# Patient Record
Sex: Male | Born: 1960
Health system: Southern US, Community
[De-identification: ages and names within clinical notes are randomized; demographics above are authoritative.]

## PROBLEM LIST (undated history)

## (undated) DIAGNOSIS — K648 Other hemorrhoids: Secondary | ICD-10-CM

## (undated) DIAGNOSIS — D689 Coagulation defect, unspecified: Secondary | ICD-10-CM

## (undated) DIAGNOSIS — T7840XA Allergy, unspecified, initial encounter: Secondary | ICD-10-CM

## (undated) DIAGNOSIS — S7291XA Unspecified fracture of right femur, initial encounter for closed fracture: Secondary | ICD-10-CM

## (undated) DIAGNOSIS — I739 Peripheral vascular disease, unspecified: Secondary | ICD-10-CM

## (undated) DIAGNOSIS — E039 Hypothyroidism, unspecified: Secondary | ICD-10-CM

## (undated) DIAGNOSIS — IMO0002 Reserved for concepts with insufficient information to code with codable children: Secondary | ICD-10-CM

## (undated) DIAGNOSIS — G8929 Other chronic pain: Secondary | ICD-10-CM

## (undated) DIAGNOSIS — M1990 Unspecified osteoarthritis, unspecified site: Secondary | ICD-10-CM

## (undated) DIAGNOSIS — E785 Hyperlipidemia, unspecified: Secondary | ICD-10-CM

## (undated) DIAGNOSIS — K219 Gastro-esophageal reflux disease without esophagitis: Secondary | ICD-10-CM

## (undated) DIAGNOSIS — E291 Testicular hypofunction: Secondary | ICD-10-CM

## (undated) DIAGNOSIS — G4730 Sleep apnea, unspecified: Secondary | ICD-10-CM

## (undated) DIAGNOSIS — Z8719 Personal history of other diseases of the digestive system: Secondary | ICD-10-CM

## (undated) HISTORY — DX: Gastro-esophageal reflux disease without esophagitis: K21.9

## (undated) HISTORY — DX: Testicular hypofunction: E29.1

## (undated) HISTORY — DX: Other chronic pain: G89.29

## (undated) HISTORY — DX: Hypothyroidism, unspecified: E03.9

## (undated) HISTORY — DX: Hyperlipidemia, unspecified: E78.5

## (undated) HISTORY — DX: Allergy, unspecified, initial encounter: T78.40XA

## (undated) HISTORY — DX: Coagulation defect, unspecified: D68.9

## (undated) HISTORY — DX: Reserved for concepts with insufficient information to code with codable children: IMO0002

## (undated) HISTORY — DX: Peripheral vascular disease, unspecified: I73.9

## (undated) HISTORY — DX: Personal history of other diseases of the digestive system: Z87.19

## (undated) HISTORY — DX: Other hemorrhoids: K64.8

## (undated) HISTORY — DX: Unspecified fracture of right femur, initial encounter for closed fracture: S72.91XA

## (undated) HISTORY — DX: Sleep apnea, unspecified: G47.30

## (undated) HISTORY — DX: Unspecified osteoarthritis, unspecified site: M19.90

---
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
## 2003-03-17 ENCOUNTER — Ambulatory Visit (HOSPITAL_COMMUNITY): Admission: RE | Admit: 2003-03-17 | Discharge: 2003-03-17 | Payer: Self-pay | Admitting: Gastroenterology

## 2007-08-08 ENCOUNTER — Encounter: Admission: RE | Admit: 2007-08-08 | Discharge: 2007-08-08 | Payer: Self-pay | Admitting: Family Medicine

## 2008-12-28 DIAGNOSIS — IMO0002 Reserved for concepts with insufficient information to code with codable children: Secondary | ICD-10-CM

## 2008-12-28 HISTORY — DX: Reserved for concepts with insufficient information to code with codable children: IMO0002

## 2010-01-25 HISTORY — PX: CARPAL TUNNEL RELEASE: SHX101

## 2010-11-12 NOTE — Op Note (Signed)
   NAME:  Cesar Leon, Cesar Leon                       ACCOUNT NO.:  [id number]   MEDICAL RECORD NO.:  [id number]                   PATIENT TYPE:  AMB   LOCATION:  ENDO                                 FACILITY:  [facility]   PHYSICIAN:  James L. Malon Kindle., M.D.          DATE OF BIRTH:  [birth year]   DATE OF PROCEDURE:  03/17/2003  DATE OF DISCHARGE:                                 OPERATIVE REPORT   PROCEDURE:  Colonoscopy.   MEDICATIONS:  Fentanyl 87.5 mcg, Versed 7 mg IV.   INDICATIONS:  The patient had a physical exam with a possible rectal polyp  found.   DESCRIPTION OF PROCEDURE:  The procedure had been explained to the patient  and consent obtained.  The patient was placed in the left lateral decubitus  position.  Olympus scope was inserted and advanced.  We were able to advance  all the way to the cecum without difficulty.  The ileocecal valve and  appendiceal orifice were seen.  The scope was withdrawn and colon carefully  examined on withdrawal.  The cecum, ascending colon, transverse colon,  descending, and sigmoid colon were seen well.  No polyps.  No diverticular  disease.  The scope was withdrawn in the rectum and retroflexed.  There were  no polyps seen in the rectum.  There were rather large internal hemorrhoids.  The scope was withdrawn and the patient tolerated the procedure well.   ASSESSMENT:  1. Large internal hemorrhoids.  2. No polyps are suggested by rectal exam.   PLAN:  Will give him hemorrhoid instructions.  Keep fiber supplements.  See  back on an as-needed basis.                                                James L. Malon Kindle., M.D.    Waldron Session  D:  03/17/2003  T:  03/17/2003  Job:  [id number]   cc:   Ernestina Penna, M.D.  [address] [place]  [place] [address]  Fax: [phone]

## 2010-11-26 ENCOUNTER — Encounter: Payer: Self-pay | Admitting: Physician Assistant

## 2012-10-08 ENCOUNTER — Ambulatory Visit (INDEPENDENT_AMBULATORY_CARE_PROVIDER_SITE_OTHER): Payer: BC Managed Care – PPO | Admitting: *Deleted

## 2012-10-08 DIAGNOSIS — R7989 Other specified abnormal findings of blood chemistry: Secondary | ICD-10-CM

## 2012-10-08 DIAGNOSIS — E291 Testicular hypofunction: Secondary | ICD-10-CM

## 2012-10-08 MED ORDER — TESTOSTERONE CYPIONATE 200 MG/ML IM SOLN
400.0000 mg | INTRAMUSCULAR | Status: DC
  Administered 2012-10-08 – 2013-03-20 (×6): 400 mg via INTRAMUSCULAR

## 2012-10-08 NOTE — Progress Notes (Signed)
Tolerated injection well. 

## 2012-10-08 NOTE — Patient Instructions (Signed)
Testosterone injection What is this medicine? TESTOSTERONE (tes TOS ter one) is the main male hormone. It supports normal male development such as muscle growth, facial hair, and deep voice. It is used in males to treat low testosterone levels. This medicine may be used for other purposes; ask your health care provider or pharmacist if you have questions. What should I tell my health care provider before I take this medicine? They need to know if you have any of these conditions: -breast cancer -diabetes -heart disease -kidney disease -liver disease -lung disease -prostate cancer, enlargement -an unusual or allergic reaction to testosterone, other medicines, foods, dyes, or preservatives -pregnant or trying to get pregnant -breast-feeding How should I use this medicine? This medicine is for injection into a muscle. It is usually given by a health care professional in a hospital or clinic setting. Contact your pediatrician regarding the use of this medicine in children. While this medicine may be prescribed for children as young as 12 years of age for selected conditions, precautions do apply. Overdosage: If you think you have taken too much of this medicine contact a poison control center or emergency room at once. NOTE: This medicine is only for you. Do not share this medicine with others. What if I miss a dose? Try not to miss a dose. Your doctor or health care professional will tell you when your next injection is due. Notify the office if you are unable to keep an appointment. What may interact with this medicine? -medicines for diabetes -medicines that treat or prevent blood clots like warfarin -oxyphenbutazone -propranolol -steroid medicines like prednisone or cortisone This list may not describe all possible interactions. Give your health care provider a list of all the medicines, herbs, non-prescription drugs, or dietary supplements you use. Also tell them if you smoke, drink  alcohol, or use illegal drugs. Some items may interact with your medicine. What should I watch for while using this medicine? Visit your doctor or health care professional for regular checks on your progress. They will need to check the level of testosterone in your blood. This medicine may affect blood sugar levels. If you have diabetes, check with your doctor or health care professional before you change your diet or the dose of your diabetic medicine. This drug is banned from use in athletes by most athletic organizations. What side effects may I notice from receiving this medicine? Side effects that you should report to your doctor or health care professional as soon as possible: -allergic reactions like skin rash, itching or hives, swelling of the face, lips, or tongue -breast enlargement -breathing problems -changes in mood, especially anger, depression, or rage -dark urine -general ill feeling or flu-like symptoms -light-colored stools -loss of appetite, nausea -nausea, vomiting -right upper belly pain -stomach pain -swelling of ankles -too frequent or persistent erections -trouble passing urine or change in the amount of urine -unusually weak or tired -yellowing of the eyes or skin Additional side effects that can occur in women include: -deep or hoarse voice -facial hair growth -irregular menstrual periods Side effects that usually do not require medical attention (report to your doctor or health care professional if they continue or are bothersome): -acne -change in sex drive or performance -hair loss -headache This list may not describe all possible side effects. Call your doctor for medical advice about side effects. You may report side effects to FDA at 1-800-FDA-1088. Where should I keep my medicine? Keep out of the reach of children. This medicine   can be abused. Keep your medicine in a safe place to protect it from theft. Do not share this medicine with anyone.  Selling or giving away this medicine is dangerous and against the law. Store at room temperature between 20 and 25 degrees C (68 and 77 degrees F). Do not freeze. Protect from light. Follow the directions for the product you are prescribed. Throw away any unused medicine after the expiration date. NOTE: This sheet is a summary. It may not cover all possible information. If you have questions about this medicine, talk to your doctor, pharmacist, or health care provider.  2012, Elsevier/Gold Standard. (08/24/2007 4:13:46 PM) 

## 2012-11-07 ENCOUNTER — Ambulatory Visit (INDEPENDENT_AMBULATORY_CARE_PROVIDER_SITE_OTHER): Payer: BC Managed Care – PPO | Admitting: *Deleted

## 2012-11-07 DIAGNOSIS — E291 Testicular hypofunction: Secondary | ICD-10-CM

## 2012-11-07 DIAGNOSIS — E349 Endocrine disorder, unspecified: Secondary | ICD-10-CM

## 2012-11-07 NOTE — Progress Notes (Signed)
Patient tolerated well.

## 2012-11-07 NOTE — Patient Instructions (Signed)
Testosterone injection What is this medicine? TESTOSTERONE (tes TOS ter one) is the main male hormone. It supports normal male development such as muscle growth, facial hair, and deep voice. It is used in males to treat low testosterone levels. This medicine may be used for other purposes; ask your health care provider or pharmacist if you have questions. What should I tell my health care provider before I take this medicine? They need to know if you have any of these conditions: -breast cancer -diabetes -heart disease -kidney disease -liver disease -lung disease -prostate cancer, enlargement -an unusual or allergic reaction to testosterone, other medicines, foods, dyes, or preservatives -pregnant or trying to get pregnant -breast-feeding How should I use this medicine? This medicine is for injection into a muscle. It is usually given by a health care professional in a hospital or clinic setting. Contact your pediatrician regarding the use of this medicine in children. While this medicine may be prescribed for children as young as 12 years of age for selected conditions, precautions do apply. Overdosage: If you think you have taken too much of this medicine contact a poison control center or emergency room at once. NOTE: This medicine is only for you. Do not share this medicine with others. What if I miss a dose? Try not to miss a dose. Your doctor or health care professional will tell you when your next injection is due. Notify the office if you are unable to keep an appointment. What may interact with this medicine? -medicines for diabetes -medicines that treat or prevent blood clots like warfarin -oxyphenbutazone -propranolol -steroid medicines like prednisone or cortisone This list may not describe all possible interactions. Give your health care provider a list of all the medicines, herbs, non-prescription drugs, or dietary supplements you use. Also tell them if you smoke, drink  alcohol, or use illegal drugs. Some items may interact with your medicine. What should I watch for while using this medicine? Visit your doctor or health care professional for regular checks on your progress. They will need to check the level of testosterone in your blood. This medicine may affect blood sugar levels. If you have diabetes, check with your doctor or health care professional before you change your diet or the dose of your diabetic medicine. This drug is banned from use in athletes by most athletic organizations. What side effects may I notice from receiving this medicine? Side effects that you should report to your doctor or health care professional as soon as possible: -allergic reactions like skin rash, itching or hives, swelling of the face, lips, or tongue -breast enlargement -breathing problems -changes in mood, especially anger, depression, or rage -dark urine -general ill feeling or flu-like symptoms -light-colored stools -loss of appetite, nausea -nausea, vomiting -right upper belly pain -stomach pain -swelling of ankles -too frequent or persistent erections -trouble passing urine or change in the amount of urine -unusually weak or tired -yellowing of the eyes or skin Additional side effects that can occur in women include: -deep or hoarse voice -facial hair growth -irregular menstrual periods Side effects that usually do not require medical attention (report to your doctor or health care professional if they continue or are bothersome): -acne -change in sex drive or performance -hair loss -headache This list may not describe all possible side effects. Call your doctor for medical advice about side effects. You may report side effects to FDA at 1-800-FDA-1088. Where should I keep my medicine? Keep out of the reach of children. This medicine   can be abused. Keep your medicine in a safe place to protect it from theft. Do not share this medicine with anyone.  Selling or giving away this medicine is dangerous and against the law. Store at room temperature between 20 and 25 degrees C (68 and 77 degrees F). Do not freeze. Protect from light. Follow the directions for the product you are prescribed. Throw away any unused medicine after the expiration date. NOTE: This sheet is a summary. It may not cover all possible information. If you have questions about this medicine, talk to your doctor, pharmacist, or health care provider.  2012, Elsevier/Gold Standard. (08/24/2007 4:13:46 PM) 

## 2012-11-12 ENCOUNTER — Other Ambulatory Visit: Payer: Self-pay

## 2012-11-12 MED ORDER — TESTOSTERONE CYPIONATE 200 MG/ML IM SOLN: INTRAMUSCULAR | Status: DC

## 2012-11-12 NOTE — Telephone Encounter (Signed)
Last seen 2 /14  Print RX and have nurse call patient to pick up

## 2012-11-12 NOTE — Telephone Encounter (Signed)
Pt aware.

## 2012-11-12 NOTE — Telephone Encounter (Signed)
rx ready for pickup 

## 2012-11-13 ENCOUNTER — Other Ambulatory Visit: Payer: Self-pay

## 2012-11-13 MED ORDER — TRAMADOL HCL (ER BIPHASIC) 300 MG PO TB24: 1.0000 | ORAL_TABLET | Freq: Every day | ORAL | Status: DC

## 2012-11-13 NOTE — Telephone Encounter (Signed)
Last seen 08/23/12  If approved print for mail order and have nurse call patient to pick up

## 2012-12-13 ENCOUNTER — Ambulatory Visit (INDEPENDENT_AMBULATORY_CARE_PROVIDER_SITE_OTHER): Payer: BC Managed Care – PPO | Admitting: *Deleted

## 2012-12-13 DIAGNOSIS — E349 Endocrine disorder, unspecified: Secondary | ICD-10-CM

## 2012-12-13 DIAGNOSIS — E291 Testicular hypofunction: Secondary | ICD-10-CM

## 2012-12-21 ENCOUNTER — Encounter: Payer: Self-pay | Admitting: Nurse Practitioner

## 2012-12-21 ENCOUNTER — Ambulatory Visit (INDEPENDENT_AMBULATORY_CARE_PROVIDER_SITE_OTHER): Payer: BC Managed Care – PPO | Admitting: Nurse Practitioner

## 2012-12-21 VITALS — BP 109/74 | HR 73 | Temp 98.5°F | Ht 69.0 in | Wt 222.0 lb

## 2012-12-21 DIAGNOSIS — K219 Gastro-esophageal reflux disease without esophagitis: Secondary | ICD-10-CM

## 2012-12-21 DIAGNOSIS — J309 Allergic rhinitis, unspecified: Secondary | ICD-10-CM

## 2012-12-21 DIAGNOSIS — E785 Hyperlipidemia, unspecified: Secondary | ICD-10-CM

## 2012-12-21 DIAGNOSIS — Z23 Encounter for immunization: Secondary | ICD-10-CM

## 2012-12-21 DIAGNOSIS — IMO0002 Reserved for concepts with insufficient information to code with codable children: Secondary | ICD-10-CM

## 2012-12-21 LAB — COMPLETE METABOLIC PANEL WITH GFR
AST: 14 U/L (ref 0–37)
Albumin: 4.2 g/dL (ref 3.5–5.2)
Alkaline Phosphatase: 63 U/L (ref 39–117)
Chloride: 100 mEq/L (ref 96–112)
Glucose, Bld: 112 mg/dL — ABNORMAL HIGH (ref 70–99)
Potassium: 4.3 mEq/L (ref 3.5–5.3)
Sodium: 138 mEq/L (ref 135–145)
Total Protein: 6.5 g/dL (ref 6.0–8.3)

## 2012-12-21 LAB — NMR LIPOPROFILE WITH LIPIDS
Cholesterol, Total: 113 mg/dL (ref <200)
HDL Particle Number: 31 umol/L (ref >=30.5)
Large HDL-P: <1.3 umol/L — ABNORMAL LOW (ref >=4.8)
Large VLDL-P: 5.2 nmol/L — ABNORMAL HIGH (ref <=2.7)
Triglycerides: 120 mg/dL (ref <150)

## 2012-12-21 MED ORDER — ATORVASTATIN CALCIUM 40 MG PO TABS: 40.0000 mg | ORAL_TABLET | Freq: Every day | ORAL | Status: DC

## 2012-12-21 MED ORDER — TRAMADOL HCL (ER BIPHASIC) 300 MG PO TB24: 1.0000 | ORAL_TABLET | Freq: Every day | ORAL | Status: DC

## 2012-12-21 MED ORDER — FLUTICASONE PROPIONATE 50 MCG/ACT NA SUSP: 2.0000 | Freq: Every day | NASAL | Status: DC

## 2012-12-21 MED ORDER — CETIRIZINE HCL 10 MG PO TABS: 10.0000 mg | ORAL_TABLET | Freq: Every day | ORAL | Status: DC

## 2012-12-21 MED ORDER — ESOMEPRAZOLE MAGNESIUM 40 MG PO CPDR: 40.0000 mg | DELAYED_RELEASE_CAPSULE | Freq: Every day | ORAL | Status: DC

## 2012-12-21 NOTE — Patient Instructions (Addendum)
Tetanus and Diphtheria Vaccine Your caregiver has suggested that you receive an immunization to prevent tetanus (lockjaw) and diphtheria. Tetanus and diphtheria are serious and deadly infectious diseases of the past that have been nearly wiped out by modern immunizations. Td or DT vaccines (shots) are the immunizations given to help prevent these illnesses. Td is the medical term for a standard tetanus dose, small diphtheria dose. DT means both in standard doses. ABOUT THE DISEASES Tetanus (lockjaw) and diphtheria are serious diseases. Tetanus is caused by a germ that lives in the soil. It enters the body through a cut or wound, often caused by a nail or broken piece of glass. You cannot catch tetanus from another person. Diphtheria spreads when germs pass from an infected person to the nose or throat of others. Tetanus causes serious, painful spasms of all muscles. It can lead to:  "Locking" of the muscles of the jaw and throat, so the patient cannot open his or her mouth or swallow.  Damage to the heart muscle. Diphtheria causes a thick coating in the nose, throat, or airway. It can lead to:  Breathing problems.  Kidney problems.  Heart failure.  Paralysis.  Death. ABOUT THE VACCINES  A vaccine is a shot (immunization) that can help prevent a disease. Vaccines have helped lower the rates of getting certain diseases. If people stopped getting vaccinated, more people would develop illnesses. These vaccines can be used in three ways:  As catch-up for people who did not get all their doses when they were children.  As a booster dose every 10 years.  For protection against tetanus infection, after a wound. Benefits of the vaccines Vaccination is the best way to protect against tetanus and diphtheria. Because of vaccination, there are fewer cases of these diseases. Cases are rare in children because most get a routine vaccination with DTP (Diphtheria, Tetanus, and Pertussis), DTaP  (Diphtheria, Tetanus, and acellular Pertussis), or DT (Diphtheria and Tetanus) vaccines. There would be many more cases if we stopped vaccinating people. Tetanus kills about 1 in 5 people who are infected. WHEN SHOULD YOU GET TD VACCINE?  Td is made for people 7 years of age and older.  People who have not gotten at least 3 doses of any tetanus and diphtheria vaccine (DTP, DTaP or DT) during their lifetime should do so using Td. After a person gets the third dose, a Td dose is needed every 10 years all through life. This is because protection fades over time. Booster shots are needed every 10 years.  Other vaccines may be given at the same time as Td. You may not know today whether your immunizations are current. The vaccine given today is to protect you from your next cut or injury. It does not offer protection for the current injury. An immune globulin injection may be given, if protection is needed immediately. Check with your caregiver later regarding your immunization status. Tell your caregiver if the person getting the vaccine:  Has ever had a serious allergic reaction or other problem with Td, or any other tetanus and diphtheria vaccine (DTP, DTaP, or DT). People who have had a serious allergic reaction should not receive the vaccine.  Has epilepsy or another nervous system illness.  Has had Guillain Barre Syndrome (GBS) in the past.  Now has a moderate or severe illness.  Is pregnant.  If you are not sure, ask your caregiver. WHAT ARE THE RISKS FROM TD VACCINE?  As with any medicine, there are very small   risks that serious problems, even death, could occur after getting a vaccine. However, the risk of a serious side effect from the vaccine is almost zero.  The risks from the vaccine are much smaller than the risks from the diseases, if people stopped getting vaccinated. Both diseases can cause serious health problems, which are prevented by the vaccine.  Almost all people who get  Td have no problems from it. Mild problems If mild problems occur, they usually start within hours to a day or two after vaccination. They may last 1-2 days:  Soreness, redness, or swelling where the shot was given.  Headache or tiredness.  Occasionally, a low grade fever. These problems can be worse in adults who get Td vaccine very often. Non-aspirin medicines may be used to reduce soreness. Severe problems These problems happen very rarely:  Serious allergic reaction (at most, occurs in 1 in 1 million vaccinated persons). This occurs almost immediately, and is treatable with medicines. Signs of a serious allergic reaction include:  Difficulty breathing.  Hoarseness or wheezing.  Hives.  Dizziness.  Deep, aching pain and muscle wasting in upper arm(s). Overall, the benefits to you and your family from these vaccines are far greater than the risk. WHAT TO DO IF THERE IS A SERIOUS REACTION:  Call a caregiver or get the person to a doctor or emergency room right away.  Write down what happened, the date and time it happened, and tell your caregiver.  Ask your caregiver to file a Vaccine Adverse Event Report form or call, toll-free: [phone] If you want to learn more about this vaccine, ask your caregiver. She/he can give you the vaccine package insert or suggest other sources of information. Also, the [employer] gives compensation (payment) for persons thought to be injured by vaccines. For details call, toll-free: [phone]. Document Released: 06/10/2000 Document Revised: 09/05/2011 Document Reviewed: 04/30/2009 [facility] Patient Information 2014 [place], [place]. Tetanus, Diphtheria, Pertussis (Tdap) Vaccine What You Need to Know WHY GET VACCINATED? Tetanus, diphtheria and pertussis can be very serious diseases, even for adolescents and adults. Tdap vaccine can protect [place] from these diseases. TETANUS (Lockjaw) causes painful muscle  tightening and stiffness, usually all over the body.  It can lead to tightening of muscles in the head and neck so you can't open your mouth, swallow, or sometimes even breathe. Tetanus kills about 1 out of 5 people who are infected. DIPHTHERIA can cause a thick coating to form in the back of the throat.  It can lead to breathing problems, paralysis, heart failure, and death. PERTUSSIS (Whooping Cough) causes severe coughing spells, which can cause difficulty breathing, vomiting and disturbed sleep.  It can also lead to weight loss, incontinence, and rib fractures. Up to 2 in 100 adolescents and 5 in 100 adults with pertussis are hospitalized or have complications, which could include pneumonia and death. These diseases are caused by bacteria. Diphtheria and pertussis are spread from person to person through coughing or sneezing. Tetanus enters the body through cuts, scratches, or wounds. Before vaccines, the [place] saw as many as 200,000 cases a year of diphtheria and pertussis, and hundreds of cases of tetanus. Since vaccination began, tetanus and diphtheria have dropped by about 99% and pertussis by about 80%. TDAP VACCINE Tdap vaccine can protect adolescents and adults from tetanus, diphtheria, and pertussis. One dose of Tdap is routinely given at age 45 or 35. People who did not get Tdap at that age should get it  as soon as possible. Tdap is especially important for health care professionals and anyone having close contact with a baby younger than 12 months. Pregnant women should get a dose of Tdap during every pregnancy, to protect the newborn from pertussis. Infants are most at risk for severe, life-threatening complications from pertussis. A similar vaccine, called Td, protects from tetanus and diphtheria, but not pertussis. A Td booster should be given every 10 years. Tdap may be given as one of these boosters if you have not already gotten a dose. Tdap may also be given after a severe  cut or burn to prevent tetanus infection. Your doctor can give you more information. Tdap may safely be given at the same time as other vaccines. SOME PEOPLE SHOULD NOT GET THIS VACCINE  If you ever had a life-threatening allergic reaction after a dose of any tetanus, diphtheria, or pertussis containing vaccine, OR if you have a severe allergy to any part of this vaccine, you should not get Tdap. Tell your doctor if you have any severe allergies.  If you had a coma, or long or multiple seizures within 7 days after a childhood dose of DTP or DTaP, you should not get Tdap, unless a cause other than the vaccine was found. You can still get Td.  Talk to your doctor if you:  have epilepsy or another nervous system problem,  had severe pain or swelling after any vaccine containing diphtheria, tetanus or pertussis,  ever had Guillain-Barr Syndrome (GBS),  aren't feeling well on the day the shot is scheduled. RISKS OF A VACCINE REACTION With any medicine, including vaccines, there is a chance of side effects. These are usually mild and go away on their own, but serious reactions are also possible. Brief fainting spells can follow a vaccination, leading to injuries from falling. Sitting or lying down for about 15 minutes can help prevent these. Tell your doctor if you feel dizzy or light-headed, or have vision changes or ringing in the ears. Mild problems following Tdap (Did not interfere with activities)  Pain where the shot was given (about 3 in 4 adolescents or 2 in 3 adults)  Redness or swelling where the shot was given (about 1 person in 5)  Mild fever of at least 100.70F (up to about 1 in 25 adolescents or 1 in 100 adults)  Headache (about 3 or 4 people in 10)  Tiredness (about 1 person in 3 or 4)  Nausea, vomiting, diarrhea, stomach ache (up to 1 in 4 adolescents or 1 in 10 adults)  Chills, body aches, sore joints, rash, swollen glands (uncommon) Moderate problems following Tdap  (Interfered with activities, but did not require medical attention)  Pain where the shot was given (about 1 in 5 adolescents or 1 in 100 adults)  Redness or swelling where the shot was given (up to about 1 in 16 adolescents or 1 in 25 adults)  Fever over 102F (about 1 in 100 adolescents or 1 in 250 adults)  Headache (about 3 in 20 adolescents or 1 in 10 adults)  Nausea, vomiting, diarrhea, stomach ache (up to 1 or 3 people in 100)  Swelling of the entire arm where the shot was given (up to about 3 in 100). Severe problems following Tdap (Unable to perform usual activities, required medical attention)  Swelling, severe pain, bleeding and redness in the arm where the shot was given (rare). A severe allergic reaction could occur after any vaccine (estimated less than 1 in a million doses).  WHAT IF THERE IS A SERIOUS REACTION? What should I look for?  Look for anything that concerns you, such as signs of a severe allergic reaction, very high fever, or behavior changes. Signs of a severe allergic reaction can include hives, swelling of the face and throat, difficulty breathing, a fast heartbeat, dizziness, and weakness. These would start a few minutes to a few hours after the vaccination. What should I do?  If you think it is a severe allergic reaction or other emergency that can't wait, call 9-1-1 or get the person to the nearest hospital. Otherwise, call your doctor.  Afterward, the reaction should be reported to the "Vaccine Adverse Event Reporting System" (VAERS). Your doctor might file this report, or you can do it yourself through the VAERS web site at www.vaers.LAgents.no, or by calling 1-(309)163-1760. VAERS is only for reporting reactions. They do not give medical advice.  THE NATIONAL VACCINE INJURY COMPENSATION PROGRAM The National Vaccine Injury Compensation Program (VICP) is a federal program that was created to compensate people who may have been injured by certain  vaccines. Persons who believe they may have been injured by a vaccine can learn about the program and about filing a claim by calling 1-(947) 426-6550 or visiting the VICP website at SpiritualWord.at. HOW CAN I LEARN MORE?  Ask your doctor.  Call your local or state health department.  Contact the Centers for Disease Control and Prevention (CDC):  Call 7150952214 or visit CDC's website at PicCapture.uy. CDC Tdap Vaccine VIS (11/03/11) Document Released: 12/13/2011 Document Revised: 03/07/2012 Document Reviewed: 12/13/2011 [employer] Patient Information 2014 [place], [place].

## 2012-12-21 NOTE — Progress Notes (Signed)
  Subjective:    Patient ID: Cesar Leon, male    DOB: 05-23-1961, 52 y.o.   MRN: [id number]  Hyperlipidemia This is a chronic problem. The current episode started more than 1 year ago. The problem is controlled. Recent lipid tests were reviewed and are normal. There are no known factors aggravating his hyperlipidemia. Pertinent negatives include no focal sensory loss, leg pain, myalgias or shortness of breath. Current antihyperlipidemic treatment includes statins. The current treatment provides moderate improvement of lipids. Compliance problems include adherence to diet and adherence to exercise.  Risk factors for coronary artery disease include male sex.  DDD- neck and back Patient not a surgery candidate takes vicodin only when can't tolerate pain- takes tramadol daily which helps most of the time. Allergic Rhinitis Zyrtec and flonase daily which is working well for him GERD Currently on nexium that is working well for stomach. Review of Systems  Respiratory: Negative for shortness of breath.   Gastrointestinal:       Heartburn on occassion  Musculoskeletal: Negative for myalgias.  All other systems reviewed and are negative.       Objective:   Physical Exam  Constitutional: He is oriented to person, place, and time. He appears well-developed and well-nourished.  HENT:  Head: Normocephalic.  Right Ear: External ear normal.  Left Ear: External ear normal.  Nose: Nose normal.  Mouth/Throat: Oropharynx is clear and moist.  Eyes: EOM are normal. Pupils are equal, round, and reactive to light.  Neck: Normal range of motion. Neck supple. No thyromegaly present.  Cardiovascular: Normal rate, regular rhythm, normal heart sounds and intact distal pulses.   No murmur heard. Pulmonary/Chest: Effort normal and breath sounds normal. He has no wheezes. He has no rales.  Abdominal: Soft. Bowel sounds are normal.  Musculoskeletal: Normal range of motion.  Neurological: He is alert and  oriented to person, place, and time.  Skin: Skin is warm and dry.  Psychiatric: He has a normal mood and affect. His behavior is normal. Judgment and thought content normal.     BP 109/74  Pulse 73  Temp(Src) 98.5 F (36.9 C) (Oral)  Ht 5\' 9"  (1.753 m)  Wt 222 lb (100.699 kg)  BMI 32.77 kg/m2      Assessment & Plan:  1. Hyperlipidemia Low fat diet and exercise - atorvastatin (LIPITOR) 40 MG tablet; Take 1 tablet (40 mg total) by mouth daily.  Dispense: 90 tablet; Refill: 1 - COMPLETE METABOLIC PANEL WITH GFR - NMR Lipoprofile with Lipids  2. Allergic rhinitis - cetirizine (ZYRTEC) 10 MG tablet; Take 1 tablet (10 mg total) by mouth daily.  Dispense: 90 tablet; Refill: 1 - fluticasone (FLONASE) 50 MCG/ACT nasal spray; Place 2 sprays into the nose at bedtime.  Dispense: 48 g; Refill: 1  3. DDD (degenerative disc disease) Moist heat to back No heavy lifting - TraMADol HCl 300 MG TB24; Take 1 tablet by mouth daily.  Dispense: 90 tablet; Refill: 1  4. GERD (gastroesophageal reflux disease) Avoid spicy and fatty foods Do not eat 2 hours prior to bed time - esomeprazole (NEXIUM) 40 MG capsule; Take 1 capsule (40 mg total) by mouth daily before breakfast.  Dispense: 90 capsule; Refill: 1   Mary-Margaret Daphine Deutscher, FNP

## 2013-01-11 ENCOUNTER — Ambulatory Visit (INDEPENDENT_AMBULATORY_CARE_PROVIDER_SITE_OTHER): Payer: BC Managed Care – PPO | Admitting: *Deleted

## 2013-01-11 DIAGNOSIS — E349 Endocrine disorder, unspecified: Secondary | ICD-10-CM

## 2013-01-11 DIAGNOSIS — E291 Testicular hypofunction: Secondary | ICD-10-CM

## 2013-01-11 NOTE — Patient Instructions (Signed)
Testosterone injection What is this medicine? TESTOSTERONE (tes TOS ter one) is the main male hormone. It supports normal male development such as muscle growth, facial hair, and deep voice. It is used in males to treat low testosterone levels. This medicine may be used for other purposes; ask your health care provider or pharmacist if you have questions. What should I tell my health care provider before I take this medicine? They need to know if you have any of these conditions: -breast cancer -diabetes -heart disease -kidney disease -liver disease -lung disease -prostate cancer, enlargement -an unusual or allergic reaction to testosterone, other medicines, foods, dyes, or preservatives -pregnant or trying to get pregnant -breast-feeding How should I use this medicine? This medicine is for injection into a muscle. It is usually given by a health care professional in a hospital or clinic setting. Contact your pediatrician regarding the use of this medicine in children. While this medicine may be prescribed for children as young as 12 years of age for selected conditions, precautions do apply. Overdosage: If you think you have taken too much of this medicine contact a poison control center or emergency room at once. NOTE: This medicine is only for you. Do not share this medicine with others. What if I miss a dose? Try not to miss a dose. Your doctor or health care professional will tell you when your next injection is due. Notify the office if you are unable to keep an appointment. What may interact with this medicine? -medicines for diabetes -medicines that treat or prevent blood clots like warfarin -oxyphenbutazone -propranolol -steroid medicines like prednisone or cortisone This list may not describe all possible interactions. Give your health care provider a list of all the medicines, herbs, non-prescription drugs, or dietary supplements you use. Also tell them if you smoke, drink  alcohol, or use illegal drugs. Some items may interact with your medicine. What should I watch for while using this medicine? Visit your doctor or health care professional for regular checks on your progress. They will need to check the level of testosterone in your blood. This medicine may affect blood sugar levels. If you have diabetes, check with your doctor or health care professional before you change your diet or the dose of your diabetic medicine. This drug is banned from use in athletes by most athletic organizations. What side effects may I notice from receiving this medicine? Side effects that you should report to your doctor or health care professional as soon as possible: -allergic reactions like skin rash, itching or hives, swelling of the face, lips, or tongue -breast enlargement -breathing problems -changes in mood, especially anger, depression, or rage -dark urine -general ill feeling or flu-like symptoms -light-colored stools -loss of appetite, nausea -nausea, vomiting -right upper belly pain -stomach pain -swelling of ankles -too frequent or persistent erections -trouble passing urine or change in the amount of urine -unusually weak or tired -yellowing of the eyes or skin Additional side effects that can occur in women include: -deep or hoarse voice -facial hair growth -irregular menstrual periods Side effects that usually do not require medical attention (report to your doctor or health care professional if they continue or are bothersome): -acne -change in sex drive or performance -hair loss -headache This list may not describe all possible side effects. Call your doctor for medical advice about side effects. You may report side effects to FDA at 1-800-FDA-1088. Where should I keep my medicine? Keep out of the reach of children. This medicine   can be abused. Keep your medicine in a safe place to protect it from theft. Do not share this medicine with anyone.  Selling or giving away this medicine is dangerous and against the law. Store at room temperature between 20 and 25 degrees C (68 and 77 degrees F). Do not freeze. Protect from light. Follow the directions for the product you are prescribed. Throw away any unused medicine after the expiration date. NOTE: This sheet is a summary. It may not cover all possible information. If you have questions about this medicine, talk to your doctor, pharmacist, or health care provider.  2012, Elsevier/Gold Standard. (08/24/2007 4:13:46 PM) 

## 2013-01-11 NOTE — Progress Notes (Signed)
Patient tolerated well.

## 2013-01-22 ENCOUNTER — Other Ambulatory Visit: Payer: Self-pay | Admitting: *Deleted

## 2013-01-22 MED ORDER — POLYETHYLENE GLYCOL 3350 17 G PO PACK: 17.0000 g | PACK | Freq: Every day | ORAL | Status: DC

## 2013-01-22 NOTE — Telephone Encounter (Signed)
PT HAS BEEN GETTING A BOTTLE THAT HAS #[phone] TO LAST A MONTH. ONLY OPTION EPIC IS PACKET OR BOX. SPOKE WITH DRUG STORE AND THEY WILL CONVERT THE PACKETS TO THE BOTTLE ONCE WE SEND IN RX. THANKS.

## 2013-02-14 ENCOUNTER — Ambulatory Visit: Payer: BC Managed Care – PPO

## 2013-02-15 ENCOUNTER — Ambulatory Visit (INDEPENDENT_AMBULATORY_CARE_PROVIDER_SITE_OTHER): Payer: BC Managed Care – PPO | Admitting: *Deleted

## 2013-02-15 DIAGNOSIS — E291 Testicular hypofunction: Secondary | ICD-10-CM

## 2013-03-18 ENCOUNTER — Ambulatory Visit: Payer: BC Managed Care – PPO

## 2013-03-20 ENCOUNTER — Ambulatory Visit (INDEPENDENT_AMBULATORY_CARE_PROVIDER_SITE_OTHER): Payer: BC Managed Care – PPO | Admitting: *Deleted

## 2013-03-20 DIAGNOSIS — Z23 Encounter for immunization: Secondary | ICD-10-CM

## 2013-03-20 DIAGNOSIS — E349 Endocrine disorder, unspecified: Secondary | ICD-10-CM

## 2013-03-20 DIAGNOSIS — E291 Testicular hypofunction: Secondary | ICD-10-CM

## 2013-03-20 NOTE — Addendum Note (Signed)
Addended by: Ardine Eng A on: 03/20/2013 11:13 AM   Modules accepted: Orders

## 2013-03-20 NOTE — Progress Notes (Signed)
Patient ID: Cesar Leon, male   DOB: [birth year], 52 y.o.   MRN: [id number] Pt tolerated injection well

## 2013-03-29 ENCOUNTER — Ambulatory Visit (INDEPENDENT_AMBULATORY_CARE_PROVIDER_SITE_OTHER): Payer: BC Managed Care – PPO | Admitting: Family Medicine

## 2013-03-29 ENCOUNTER — Encounter: Payer: Self-pay | Admitting: Family Medicine

## 2013-03-29 VITALS — BP 121/77 | HR 64 | Temp 97.7°F | Ht 69.0 in | Wt 222.8 lb

## 2013-03-29 DIAGNOSIS — E349 Endocrine disorder, unspecified: Secondary | ICD-10-CM

## 2013-03-29 DIAGNOSIS — E785 Hyperlipidemia, unspecified: Secondary | ICD-10-CM

## 2013-03-29 DIAGNOSIS — I10 Essential (primary) hypertension: Secondary | ICD-10-CM

## 2013-03-29 DIAGNOSIS — E559 Vitamin D deficiency, unspecified: Secondary | ICD-10-CM

## 2013-03-29 DIAGNOSIS — R7989 Other specified abnormal findings of blood chemistry: Secondary | ICD-10-CM

## 2013-03-29 DIAGNOSIS — D751 Secondary polycythemia: Secondary | ICD-10-CM

## 2013-03-29 DIAGNOSIS — E291 Testicular hypofunction: Secondary | ICD-10-CM

## 2013-03-29 DIAGNOSIS — R718 Other abnormality of red blood cells: Secondary | ICD-10-CM

## 2013-03-29 LAB — POCT CBC
Granulocyte percent: 62.9 %G (ref 37–80)
HCT, POC: 44 % (ref 43.5–53.7)
Hemoglobin: 14.9 g/dL (ref 14.1–18.1)
Lymph, poc: 1.6 (ref 0.6–3.4)
MCH, POC: 29 pg (ref 27–31.2)
MCHC: 33.8 g/dL (ref 31.8–35.4)
MCV: 85.9 fL (ref 80–97)
MPV: 6.7 fL (ref 0–99.8)
POC Granulocyte: 3 (ref 2–6.9)
POC LYMPH PERCENT: 33.3 %L (ref 10–50)
Platelet Count, POC: 215 10*3/uL (ref 142–424)
RBC: 5.1 M/uL (ref 4.69–6.13)
RDW, POC: 13 %
WBC: 4.8 10*3/uL (ref 4.6–10.2)

## 2013-03-29 MED ORDER — TESTOSTERONE CYPIONATE 200 MG/ML IM SOLN: INTRAMUSCULAR | Status: DC

## 2013-03-29 NOTE — Patient Instructions (Signed)

## 2013-03-29 NOTE — Progress Notes (Signed)
  Subjective:    Patient ID: Cesar Leon, male    DOB: [birth year], 52 y.o.   MRN: [id number]  HPI This 52 y.o. male presents for evaluation of follow up.  He has hx of hypogonadism and needs Refill on his testosterone.  He is due for labs.  He has hx of hypertension.   Review of Systems No chest pain, SOB, HA, dizziness, vision change, N/V, diarrhea, constipation, dysuria, urinary urgency or frequency, myalgias, arthralgias or rash.     Objective:   Physical Exam  Vital signs noted  Well developed well nourished male.  HEENT - Head atraumatic Normocephalic                Eyes - PERRLA, Conjuctiva - clear Sclera- Clear EOMI                Ears - EAC's Wnl TM's Wnl Gross Hearing WNL                Nose - Nares patent                 Throat - oropharanx wnl Respiratory - Lungs CTA bilateral Cardiac - RRR S1 and S2 without murmur GI - Abdomen soft Nontender and bowel sounds active x 4 Extremities - No edema. Neuro - Grossly intact.      Assessment & Plan:  Hyperlipemia - Plan: Lipid panel  Hypertension - Plan: POCT CBC, CMP14+EGFR, POCT CBC  Testosterone deficiency - Plan: Testosterone,Free and Total, PSA, total and free, testosterone cypionate (DEPOTESTOTERONE CYPIONATE) 200 MG/ML injection  Vitamin D deficiency - Plan: Vit D  25 hydroxy (rtn osteoporosis monitoring)  Elevated red blood cell count - Plan: POCT CBC, POCT CBC  Low testosterone - Plan: testosterone cypionate (DEPOTESTOTERONE CYPIONATE) 200 MG/ML injection  Follow up in 3 months.  Deatra Canter FNP

## 2013-04-02 LAB — PSA, TOTAL AND FREE
PSA, Free Pct: 27.1 %
PSA, Free: 0.19 ng/mL
PSA: 0.7 ng/mL (ref 0.0–4.0)

## 2013-04-02 LAB — CMP14+EGFR
ALT: 13 IU/L (ref 0–44)
AST: 14 IU/L (ref 0–40)
Albumin/Globulin Ratio: 2.4 (ref 1.1–2.5)
Albumin: 3.9 g/dL (ref 3.5–5.5)
Alkaline Phosphatase: 74 IU/L (ref 39–117)
BUN/Creatinine Ratio: 19 (ref 9–20)
BUN: 18 mg/dL (ref 6–24)
CO2: 28 mmol/L (ref 18–29)
Calcium: 8.6 mg/dL — ABNORMAL LOW (ref 8.7–10.2)
Chloride: 101 mmol/L (ref 97–108)
Creatinine, Ser: 0.96 mg/dL (ref 0.76–1.27)
GFR calc Af Amer: 104 mL/min/{1.73_m2} (ref >59)
GFR calc non Af Amer: 90 mL/min/{1.73_m2} (ref >59)
Globulin, Total: 1.6 g/dL (ref 1.5–4.5)
Glucose: 90 mg/dL (ref 65–99)
Potassium: 4.5 mmol/L (ref 3.5–5.2)
Sodium: 143 mmol/L (ref 134–144)
Total Bilirubin: 0.3 mg/dL (ref 0.0–1.2)
Total Protein: 5.5 g/dL — ABNORMAL LOW (ref 6.0–8.5)

## 2013-04-02 LAB — LIPID PANEL
Chol/HDL Ratio: 3 ratio units (ref 0.0–5.0)
Cholesterol, Total: 103 mg/dL (ref 100–199)
HDL: 34 mg/dL — ABNORMAL LOW (ref >39)
LDL Calculated: 55 mg/dL (ref 0–99)
Triglycerides: 69 mg/dL (ref 0–149)
VLDL Cholesterol Cal: 14 mg/dL (ref 5–40)

## 2013-04-02 LAB — VITAMIN D 25 HYDROXY (VIT D DEFICIENCY, FRACTURES): Vit D, 25-Hydroxy: 35.2 ng/mL (ref 30.0–100.0)

## 2013-04-02 LAB — TESTOSTERONE,FREE AND TOTAL
Testosterone, Free: 26.2 pg/mL — ABNORMAL HIGH (ref 7.2–24.0)
Testosterone: 1300 ng/dL — ABNORMAL HIGH (ref 348–1197)

## 2013-05-01 ENCOUNTER — Ambulatory Visit: Payer: BC Managed Care – PPO

## 2013-05-02 ENCOUNTER — Ambulatory Visit (INDEPENDENT_AMBULATORY_CARE_PROVIDER_SITE_OTHER): Payer: BC Managed Care – PPO | Admitting: *Deleted

## 2013-05-02 DIAGNOSIS — E291 Testicular hypofunction: Secondary | ICD-10-CM

## 2013-05-02 MED ORDER — TESTOSTERONE CYPIONATE 200 MG/ML IM SOLN
200.0000 mg | INTRAMUSCULAR | Status: DC
  Administered 2013-05-02 – 2013-10-11 (×5): 200 mg via INTRAMUSCULAR

## 2013-05-02 NOTE — Patient Instructions (Signed)
Testosterone injection What is this medicine? TESTOSTERONE (tes TOS ter one) is the main male hormone. It supports normal male development such as muscle growth, facial hair, and deep voice. It is used in males to treat low testosterone levels. This medicine may be used for other purposes; ask your health care provider or pharmacist if you have questions. COMMON BRAND NAME(S): Andro-L.A., Aveed, Delatestryl, Depo-Testosterone, Virilon What should I tell my health care provider before I take this medicine? They need to know if you have any of these conditions: -breast cancer -diabetes -heart disease -kidney disease -liver disease -lung disease -prostate cancer, enlargement -an unusual or allergic reaction to testosterone, other medicines, foods, dyes, or preservatives -pregnant or trying to get pregnant -breast-feeding How should I use this medicine? This medicine is for injection into a muscle. It is usually given by a health care professional in a hospital or clinic setting. Contact your pediatrician regarding the use of this medicine in children. While this medicine may be prescribed for children as young as 12 years of age for selected conditions, precautions do apply. Overdosage: If you think you have taken too much of this medicine contact a poison control center or emergency room at once. NOTE: This medicine is only for you. Do not share this medicine with others. What if I miss a dose? Try not to miss a dose. Your doctor or health care professional will tell you when your next injection is due. Notify the office if you are unable to keep an appointment. What may interact with this medicine? -medicines for diabetes -medicines that treat or prevent blood clots like warfarin -oxyphenbutazone -propranolol -steroid medicines like prednisone or cortisone This list may not describe all possible interactions. Give your health care provider a list of all the medicines, herbs,  non-prescription drugs, or dietary supplements you use. Also tell them if you smoke, drink alcohol, or use illegal drugs. Some items may interact with your medicine. What should I watch for while using this medicine? Visit your doctor or health care professional for regular checks on your progress. They will need to check the level of testosterone in your blood. This medicine may affect blood sugar levels. If you have diabetes, check with your doctor or health care professional before you change your diet or the dose of your diabetic medicine. This drug is banned from use in athletes by most athletic organizations. What side effects may I notice from receiving this medicine? Side effects that you should report to your doctor or health care professional as soon as possible: -allergic reactions like skin rash, itching or hives, swelling of the face, lips, or tongue -breast enlargement -breathing problems -changes in mood, especially anger, depression, or rage -dark urine -general ill feeling or flu-like symptoms -light-colored stools -loss of appetite, nausea -nausea, vomiting -right upper belly pain -stomach pain -swelling of ankles -too frequent or persistent erections -trouble passing urine or change in the amount of urine -unusually weak or tired -yellowing of the eyes or skin Additional side effects that can occur in women include: -deep or hoarse voice -facial hair growth -irregular menstrual periods Side effects that usually do not require medical attention (report to your doctor or health care professional if they continue or are bothersome): -acne -change in sex drive or performance -hair loss -headache This list may not describe all possible side effects. Call your doctor for medical advice about side effects. You may report side effects to FDA at 1-800-FDA-1088. Where should I keep my medicine? Keep   out of the reach of children. This medicine can be abused. Keep your  medicine in a safe place to protect it from theft. Do not share this medicine with anyone. Selling or giving away this medicine is dangerous and against the law. Store at room temperature between 20 and 25 degrees C (68 and 77 degrees F). Do not freeze. Protect from light. Follow the directions for the product you are prescribed. Throw away any unused medicine after the expiration date. NOTE: This sheet is a summary. It may not cover all possible information. If you have questions about this medicine, talk to your doctor, pharmacist, or health care provider.  2014, Elsevier/Gold Standard. (2007-08-24 16:13:46)  

## 2013-05-28 ENCOUNTER — Other Ambulatory Visit: Payer: Self-pay

## 2013-05-28 MED ORDER — POLYETHYLENE GLYCOL 3350 17 G PO PACK: 17.0000 g | PACK | Freq: Every day | ORAL | Status: DC

## 2013-06-06 ENCOUNTER — Ambulatory Visit (INDEPENDENT_AMBULATORY_CARE_PROVIDER_SITE_OTHER): Payer: BC Managed Care – PPO | Admitting: *Deleted

## 2013-06-06 DIAGNOSIS — E349 Endocrine disorder, unspecified: Secondary | ICD-10-CM

## 2013-06-06 DIAGNOSIS — E291 Testicular hypofunction: Secondary | ICD-10-CM

## 2013-06-06 NOTE — Progress Notes (Signed)
Patient ID: Cesar Leon, male   DOB: 07/15/1960, 52 y.o.   MRN: [id number] Pt tolerated inj well

## 2013-06-25 ENCOUNTER — Other Ambulatory Visit: Payer: Self-pay | Admitting: *Deleted

## 2013-06-25 DIAGNOSIS — J309 Allergic rhinitis, unspecified: Secondary | ICD-10-CM

## 2013-06-25 DIAGNOSIS — IMO0002 Reserved for concepts with insufficient information to code with codable children: Secondary | ICD-10-CM

## 2013-06-25 MED ORDER — TRAMADOL HCL (ER BIPHASIC) 300 MG PO TB24: 1.0000 | ORAL_TABLET | Freq: Every day | ORAL | Status: DC

## 2013-06-25 MED ORDER — FLUTICASONE PROPIONATE 50 MCG/ACT NA SUSP: 2.0000 | Freq: Every day | NASAL | Status: DC

## 2013-06-25 NOTE — Telephone Encounter (Signed)
Patient last seen in office on 03-29-13. Rx for tramadol last filled on 05-26-13 for #30. Please advise. If approved please print and route to [facility] so nurse can call patient to come and pick up

## 2013-06-26 NOTE — Telephone Encounter (Signed)
Patient notified that prescription is available for pickup at the front desk with a photo ID.

## 2013-07-04 ENCOUNTER — Encounter: Payer: Self-pay | Admitting: Family Medicine

## 2013-07-04 ENCOUNTER — Ambulatory Visit (INDEPENDENT_AMBULATORY_CARE_PROVIDER_SITE_OTHER): Payer: BC Managed Care – PPO | Admitting: Family Medicine

## 2013-07-04 VITALS — BP 112/76 | HR 69 | Temp 98.7°F | Ht 69.0 in | Wt 216.0 lb

## 2013-07-04 DIAGNOSIS — J309 Allergic rhinitis, unspecified: Secondary | ICD-10-CM

## 2013-07-04 DIAGNOSIS — J069 Acute upper respiratory infection, unspecified: Secondary | ICD-10-CM

## 2013-07-04 DIAGNOSIS — M549 Dorsalgia, unspecified: Secondary | ICD-10-CM

## 2013-07-04 MED ORDER — FLUTICASONE PROPIONATE 50 MCG/ACT NA SUSP: 2.0000 | Freq: Every day | NASAL | Status: DC

## 2013-07-04 MED ORDER — HYDROCODONE-ACETAMINOPHEN 5-500 MG PO TABS: 1.0000 | ORAL_TABLET | Freq: Three times a day (TID) | ORAL | Status: DC | PRN

## 2013-07-04 MED ORDER — AZITHROMYCIN 250 MG PO TABS: ORAL_TABLET | ORAL | Status: DC

## 2013-07-04 NOTE — Patient Instructions (Signed)

## 2013-07-04 NOTE — Progress Notes (Signed)
   Subjective:    Patient ID: Cesar Leon, male    DOB: Dec 08, 1960, 53 y.o.   MRN: [id number]  HPI This 53 y.o. male presents for evaluation of uri sx's, back pain, allergic rhinits, and hyperlipidemia. He needs refills on his back pain medicine hydrocodone for severe pain and he needs refills. He has hx of SAR and needs flonase NS.  He has hyperlipidemia and takes atorvastatin and Has had labs last year and lipids were controlled.   Review of Systems No chest pain, SOB, HA, dizziness, vision change, N/V, diarrhea, constipation, dysuria, urinary urgency or frequency, myalgias, arthralgias or rash.     Objective:   Physical Exam  Vital signs noted  Well developed well nourished male.  HEENT - Head atraumatic Normocephalic                Eyes - PERRLA, Conjuctiva - clear Sclera- Clear EOMI                Ears - EAC's Wnl TM's Wnl Gross Hearing WNL                Nose - Nares patent                 Throat - oropharanx wnl Respiratory - Lungs CTA bilateral Cardiac - RRR S1 and S2 without murmur GI - Abdomen soft Nontender and bowel sounds active x 4 Extremities - No edema. Neuro - Grossly intact.      Assessment & Plan:  Allergic rhinitis - Plan: fluticasone (FLONASE) 50 MCG/ACT nasal spray  URI (upper respiratory infection) - Plan: azithromycin (ZITHROMAX) 250 MG tablet Push po fluids, rest, tylenol and motrin otc prn as directed for fever, arthralgias, and myalgias.  Follow up prn if sx's continue or persist.  Back pain - Plan: HYDROcodone-acetaminophen (VICODIN) 5-500 MG per tablet for severe pain  Deatra CanterWilliam J Shaquala Broeker FNP

## 2013-07-05 ENCOUNTER — Ambulatory Visit: Payer: BC Managed Care – PPO | Admitting: Family Medicine

## 2013-07-05 ENCOUNTER — Other Ambulatory Visit: Payer: Self-pay | Admitting: *Deleted

## 2013-07-05 DIAGNOSIS — E785 Hyperlipidemia, unspecified: Secondary | ICD-10-CM

## 2013-07-05 DIAGNOSIS — K219 Gastro-esophageal reflux disease without esophagitis: Secondary | ICD-10-CM

## 2013-07-05 MED ORDER — MONTELUKAST SODIUM 10 MG PO TABS: 10.0000 mg | ORAL_TABLET | Freq: Every day | ORAL | Status: DC

## 2013-07-05 MED ORDER — ATORVASTATIN CALCIUM 40 MG PO TABS: 40.0000 mg | ORAL_TABLET | Freq: Every day | ORAL | Status: DC

## 2013-07-05 MED ORDER — ESOMEPRAZOLE MAGNESIUM 40 MG PO CPDR: 40.0000 mg | DELAYED_RELEASE_CAPSULE | Freq: Every day | ORAL | Status: DC

## 2013-07-05 MED ORDER — MELOXICAM 15 MG PO TABS: 15.0000 mg | ORAL_TABLET | Freq: Every day | ORAL | Status: DC

## 2013-07-11 ENCOUNTER — Ambulatory Visit (INDEPENDENT_AMBULATORY_CARE_PROVIDER_SITE_OTHER): Payer: BC Managed Care – PPO | Admitting: *Deleted

## 2013-07-11 DIAGNOSIS — E291 Testicular hypofunction: Secondary | ICD-10-CM

## 2013-07-24 ENCOUNTER — Other Ambulatory Visit: Payer: Self-pay | Admitting: *Deleted

## 2013-07-24 DIAGNOSIS — IMO0002 Reserved for concepts with insufficient information to code with codable children: Secondary | ICD-10-CM

## 2013-07-24 NOTE — Telephone Encounter (Signed)
Last filled 06/28/13, last seen 07/02/13. Rx will print, have nurse call pt

## 2013-07-25 MED ORDER — TRAMADOL HCL (ER BIPHASIC) 300 MG PO TB24: 1.0000 | ORAL_TABLET | Freq: Every day | ORAL | Status: DC

## 2013-08-22 ENCOUNTER — Other Ambulatory Visit: Payer: Self-pay | Admitting: *Deleted

## 2013-08-22 ENCOUNTER — Ambulatory Visit: Payer: BC Managed Care – PPO

## 2013-08-22 DIAGNOSIS — IMO0002 Reserved for concepts with insufficient information to code with codable children: Secondary | ICD-10-CM

## 2013-08-22 DIAGNOSIS — M549 Dorsalgia, unspecified: Secondary | ICD-10-CM

## 2013-08-22 NOTE — Telephone Encounter (Signed)
Last tramadol  refill 07/24/13. Please print

## 2013-08-22 NOTE — Telephone Encounter (Signed)
See allergies. Refill request was for 5-325 but the last refill in epic was 5-500. Please review. Last refill 07/04/13. Last ov 07/04/13. Please print rx and route to pool.

## 2013-08-28 ENCOUNTER — Other Ambulatory Visit: Payer: Self-pay

## 2013-08-28 DIAGNOSIS — IMO0002 Reserved for concepts with insufficient information to code with codable children: Secondary | ICD-10-CM

## 2013-08-28 NOTE — Telephone Encounter (Signed)
Last sen 07/04/13  [place]  If approved print and route to nurse

## 2013-08-29 ENCOUNTER — Ambulatory Visit: Payer: BC Managed Care – PPO

## 2013-08-29 MED ORDER — TRAMADOL HCL (ER BIPHASIC) 300 MG PO TB24: 1.0000 | ORAL_TABLET | Freq: Every day | ORAL | Status: DC

## 2013-09-02 ENCOUNTER — Ambulatory Visit (INDEPENDENT_AMBULATORY_CARE_PROVIDER_SITE_OTHER): Payer: BC Managed Care – PPO | Admitting: *Deleted

## 2013-09-02 DIAGNOSIS — E291 Testicular hypofunction: Secondary | ICD-10-CM

## 2013-09-15 ENCOUNTER — Other Ambulatory Visit: Payer: Self-pay | Admitting: Family Medicine

## 2013-09-16 ENCOUNTER — Other Ambulatory Visit: Payer: Self-pay | Admitting: Family Medicine

## 2013-09-17 ENCOUNTER — Other Ambulatory Visit: Payer: Self-pay | Admitting: Family Medicine

## 2013-09-17 DIAGNOSIS — E785 Hyperlipidemia, unspecified: Secondary | ICD-10-CM

## 2013-09-17 DIAGNOSIS — K219 Gastro-esophageal reflux disease without esophagitis: Secondary | ICD-10-CM

## 2013-09-17 MED ORDER — ESOMEPRAZOLE MAGNESIUM 40 MG PO CPDR: 40.0000 mg | DELAYED_RELEASE_CAPSULE | Freq: Every day | ORAL | Status: DC

## 2013-09-17 MED ORDER — MELOXICAM 15 MG PO TABS: 15.0000 mg | ORAL_TABLET | Freq: Every day | ORAL | Status: DC

## 2013-09-17 MED ORDER — MONTELUKAST SODIUM 10 MG PO TABS: 10.0000 mg | ORAL_TABLET | Freq: Every day | ORAL | Status: DC

## 2013-09-17 MED ORDER — ATORVASTATIN CALCIUM 40 MG PO TABS: 40.0000 mg | ORAL_TABLET | Freq: Every day | ORAL | Status: DC

## 2013-09-17 NOTE — Telephone Encounter (Signed)
Last lipids 03/29/13. [facility]

## 2013-09-19 ENCOUNTER — Other Ambulatory Visit: Payer: Self-pay | Admitting: Family Medicine

## 2013-09-19 NOTE — Telephone Encounter (Signed)
[facility] 

## 2013-10-10 ENCOUNTER — Ambulatory Visit: Payer: BC Managed Care – PPO

## 2013-10-11 ENCOUNTER — Encounter: Payer: Self-pay | Admitting: Family Medicine

## 2013-10-11 ENCOUNTER — Ambulatory Visit (INDEPENDENT_AMBULATORY_CARE_PROVIDER_SITE_OTHER): Payer: BC Managed Care – PPO | Admitting: Family Medicine

## 2013-10-11 VITALS — BP 121/73 | HR 63 | Temp 98.7°F | Ht 69.0 in | Wt 211.4 lb

## 2013-10-11 DIAGNOSIS — M549 Dorsalgia, unspecified: Secondary | ICD-10-CM

## 2013-10-11 DIAGNOSIS — H01139 Eczematous dermatitis of unspecified eye, unspecified eyelid: Secondary | ICD-10-CM

## 2013-10-11 DIAGNOSIS — IMO0002 Reserved for concepts with insufficient information to code with codable children: Secondary | ICD-10-CM

## 2013-10-11 DIAGNOSIS — L259 Unspecified contact dermatitis, unspecified cause: Secondary | ICD-10-CM

## 2013-10-11 DIAGNOSIS — R7989 Other specified abnormal findings of blood chemistry: Secondary | ICD-10-CM

## 2013-10-11 DIAGNOSIS — E291 Testicular hypofunction: Secondary | ICD-10-CM

## 2013-10-11 DIAGNOSIS — E349 Endocrine disorder, unspecified: Secondary | ICD-10-CM

## 2013-10-11 DIAGNOSIS — L309 Dermatitis, unspecified: Secondary | ICD-10-CM

## 2013-10-11 LAB — POCT CBC
Granulocyte percent: 59.2 %G (ref 37–80)
HCT, POC: 46.7 % (ref 43.5–53.7)
Hemoglobin: 14.9 g/dL (ref 14.1–18.1)
Lymph, poc: 1.4 (ref 0.6–3.4)
MCH, POC: 28.2 pg (ref 27–31.2)
MCHC: 32 g/dL (ref 31.8–35.4)
MCV: 88.2 fL (ref 80–97)
MPV: 7 fL (ref 0–99.8)
POC Granulocyte: 2.4 (ref 2–6.9)
POC LYMPH PERCENT: 34 %L (ref 10–50)
Platelet Count, POC: 204 10*3/uL (ref 142–424)
RBC: 5.3 M/uL (ref 4.69–6.13)
RDW, POC: 13 %
WBC: 4.1 10*3/uL — AB (ref 4.6–10.2)

## 2013-10-11 MED ORDER — DESOXIMETASONE 0.25 % EX CREA: 1.0000 "application " | TOPICAL_CREAM | Freq: Two times a day (BID) | CUTANEOUS | Status: DC

## 2013-10-11 MED ORDER — HYDROCODONE-ACETAMINOPHEN 5-500 MG PO TABS: 1.0000 | ORAL_TABLET | Freq: Three times a day (TID) | ORAL | Status: DC | PRN

## 2013-10-11 MED ORDER — TESTOSTERONE CYPIONATE 200 MG/ML IM SOLN: 200.0000 mg | INTRAMUSCULAR | Status: DC

## 2013-10-11 MED ORDER — TRAMADOL HCL (ER BIPHASIC) 300 MG PO TB24: 1.0000 | ORAL_TABLET | Freq: Every day | ORAL | Status: DC

## 2013-10-11 NOTE — Progress Notes (Signed)
Subjective:    Patient ID: Cesar Leon, male    DOB: 07/04/1960, 53 y.o.   MRN: [id number]  HPI This 53 y.o. male presents for evaluation of routine visit.  He has DDD of LS spine and has  Been referred to neurosurgery and does not want surgery and is on pain management regimen. He has been doing fine and has no acute complaints.  He needs refills.  He has hx of hypogonadism and last testosterone was in the 1300's so his testosterone injections were decreased to 200mg a month.   Review of Systems No chest pain, SOB, HA, dizziness, vision change, N/V, diarrhea, constipation, dysuria, urinary urgency or frequency, myalgias, arthralgias or rash.     Objective:   Physical Exam Vital signs noted  Well developed well nourished male.  HEENT - Head atraumatic Normocephalic                Eyes - PERRLA, Conjuctiva - clear Sclera- Clear EOMI                Ears - EAC's Wnl TM's Wnl Gross Hearing WNL                Nose - Nares patent                 Throat - oropharanx wnl Respiratory - Lungs CTA bilateral Cardiac - RRR S1 and S2 without murmur GI - Abdomen soft Nontender and bowel sounds active x 4 Extremities - No edema. Neuro - Grossly intact.       Assessment & Plan:  DDD (degenerative disc disease) - Plan: TraMADol HCl 300 MG TB24  Back pain - Plan: HYDROcodone-acetaminophen (VICODIN) 5-500 MG per tablet  Testosterone deficiency - Plan: testosterone cypionate (DEPOTESTOTERONE CYPIONATE) 200 MG/ML injection, POCT CBC, CMP14+EGFR, Testosterone,Free and Total  Low testosterone - Plan: testosterone cypionate (DEPOTESTOTERONE CYPIONATE) 200 MG/ML injection, POCT CBC, CMP14+EGFR, Testosterone,Free and Total  Dermatitis - Plan: desoximetasone (TOPICORT) 0.25 % cream  Follow up in 3 months  William J Oxford FNP 

## 2013-10-12 LAB — CMP14+EGFR
ALT: 23 IU/L (ref 0–44)
AST: 22 IU/L (ref 0–40)
Albumin/Globulin Ratio: 2.4 (ref 1.1–2.5)
Albumin: 4.5 g/dL (ref 3.5–5.5)
Alkaline Phosphatase: 76 IU/L (ref 39–117)
BUN/Creatinine Ratio: 22 — ABNORMAL HIGH (ref 9–20)
BUN: 21 mg/dL (ref 6–24)
CO2: 29 mmol/L (ref 18–29)
Calcium: 9.3 mg/dL (ref 8.7–10.2)
Chloride: 100 mmol/L (ref 97–108)
Creatinine, Ser: 0.94 mg/dL (ref 0.76–1.27)
GFR calc Af Amer: 107 mL/min/{1.73_m2} (ref >59)
GFR calc non Af Amer: 92 mL/min/{1.73_m2} (ref >59)
Globulin, Total: 1.9 g/dL (ref 1.5–4.5)
Glucose: 91 mg/dL (ref 65–99)
Potassium: 4.2 mmol/L (ref 3.5–5.2)
Sodium: 142 mmol/L (ref 134–144)
Total Bilirubin: 0.6 mg/dL (ref 0.0–1.2)
Total Protein: 6.4 g/dL (ref 6.0–8.5)

## 2013-10-12 LAB — TESTOSTERONE,FREE AND TOTAL
Testosterone, Free: 6.1 pg/mL — ABNORMAL LOW (ref 7.2–24.0)
Testosterone: 234 ng/dL — ABNORMAL LOW (ref 348–1197)

## 2013-10-14 ENCOUNTER — Other Ambulatory Visit: Payer: Self-pay | Admitting: Family Medicine

## 2013-10-14 MED ORDER — TESTOSTERONE CYPIONATE 200 MG/ML IM SOLN: INTRAMUSCULAR | Status: DC

## 2013-11-11 ENCOUNTER — Ambulatory Visit (INDEPENDENT_AMBULATORY_CARE_PROVIDER_SITE_OTHER): Payer: BC Managed Care – PPO | Admitting: *Deleted

## 2013-11-11 DIAGNOSIS — E349 Endocrine disorder, unspecified: Secondary | ICD-10-CM

## 2013-11-11 DIAGNOSIS — E291 Testicular hypofunction: Secondary | ICD-10-CM

## 2013-11-11 MED ORDER — TESTOSTERONE CYPIONATE 200 MG/ML IM SOLN
200.0000 mg | INTRAMUSCULAR | Status: DC
  Administered 2013-12-05 – 2015-08-27 (×21): 200 mg via INTRAMUSCULAR

## 2013-11-11 MED ORDER — TESTOSTERONE CYPIONATE 200 MG/ML IM SOLN
200.0000 mg | INTRAMUSCULAR | Status: DC
Start: 2013-11-11 — End: 2013-11-11
  Administered 2013-11-11: 200 mg via INTRAMUSCULAR

## 2013-11-11 NOTE — Patient Instructions (Signed)
Testosterone injection What is this medicine? TESTOSTERONE (tes TOS ter one) is the main male hormone. It supports normal male development such as muscle growth, facial hair, and deep voice. It is used in males to treat low testosterone levels. This medicine may be used for other purposes; ask your health care provider or pharmacist if you have questions. COMMON BRAND NAME(S): Andro-L.A., Aveed, Delatestryl, Depo-Testosterone, Virilon What should I tell my health care provider before I take this medicine? They need to know if you have any of these conditions: -breast cancer -diabetes -heart disease -kidney disease -liver disease -lung disease -prostate cancer, enlargement -an unusual or allergic reaction to testosterone, other medicines, foods, dyes, or preservatives -pregnant or trying to get pregnant -breast-feeding How should I use this medicine? This medicine is for injection into a muscle. It is usually given by a health care professional in a hospital or clinic setting. Contact your pediatrician regarding the use of this medicine in children. While this medicine may be prescribed for children as young as 12 years of age for selected conditions, precautions do apply. Overdosage: If you think you have taken too much of this medicine contact a poison control center or emergency room at once. NOTE: This medicine is only for you. Do not share this medicine with others. What if I miss a dose? Try not to miss a dose. Your doctor or health care professional will tell you when your next injection is due. Notify the office if you are unable to keep an appointment. What may interact with this medicine? -medicines for diabetes -medicines that treat or prevent blood clots like warfarin -oxyphenbutazone -propranolol -steroid medicines like prednisone or cortisone This list may not describe all possible interactions. Give your health care provider a list of all the medicines, herbs,  non-prescription drugs, or dietary supplements you use. Also tell them if you smoke, drink alcohol, or use illegal drugs. Some items may interact with your medicine. What should I watch for while using this medicine? Visit your doctor or health care professional for regular checks on your progress. They will need to check the level of testosterone in your blood. This medicine may affect blood sugar levels. If you have diabetes, check with your doctor or health care professional before you change your diet or the dose of your diabetic medicine. This drug is banned from use in athletes by most athletic organizations. What side effects may I notice from receiving this medicine? Side effects that you should report to your doctor or health care professional as soon as possible: -allergic reactions like skin rash, itching or hives, swelling of the face, lips, or tongue -breast enlargement -breathing problems -changes in mood, especially anger, depression, or rage -dark urine -general ill feeling or flu-like symptoms -light-colored stools -loss of appetite, nausea -nausea, vomiting -right upper belly pain -stomach pain -swelling of ankles -too frequent or persistent erections -trouble passing urine or change in the amount of urine -unusually weak or tired -yellowing of the eyes or skin Additional side effects that can occur in women include: -deep or hoarse voice -facial hair growth -irregular menstrual periods Side effects that usually do not require medical attention (report to your doctor or health care professional if they continue or are bothersome): -acne -change in sex drive or performance -hair loss -headache This list may not describe all possible side effects. Call your doctor for medical advice about side effects. You may report side effects to FDA at 1-800-FDA-1088. Where should I keep my medicine? Keep   out of the reach of children. This medicine can be abused. Keep your  medicine in a safe place to protect it from theft. Do not share this medicine with anyone. Selling or giving away this medicine is dangerous and against the law. Store at room temperature between 20 and 25 degrees C (68 and 77 degrees F). Do not freeze. Protect from light. Follow the directions for the product you are prescribed. Throw away any unused medicine after the expiration date. NOTE: This sheet is a summary. It may not cover all possible information. If you have questions about this medicine, talk to your doctor, pharmacist, or health care provider.  2014, Elsevier/Gold Standard. (2007-08-24 16:13:46)  

## 2013-11-11 NOTE — Progress Notes (Signed)
Testosterone injection given and tolerated well.  

## 2013-11-17 ENCOUNTER — Other Ambulatory Visit: Payer: Self-pay | Admitting: Family Medicine

## 2013-12-05 ENCOUNTER — Ambulatory Visit (INDEPENDENT_AMBULATORY_CARE_PROVIDER_SITE_OTHER): Payer: BC Managed Care – PPO | Admitting: *Deleted

## 2013-12-05 DIAGNOSIS — E349 Endocrine disorder, unspecified: Secondary | ICD-10-CM

## 2013-12-05 DIAGNOSIS — E291 Testicular hypofunction: Secondary | ICD-10-CM

## 2013-12-05 NOTE — Progress Notes (Signed)
Testosterone injection given and tolerated well.  

## 2013-12-05 NOTE — Patient Instructions (Signed)
Testosterone injection What is this medicine? TESTOSTERONE (tes TOS ter one) is the main male hormone. It supports normal male development such as muscle growth, facial hair, and deep voice. It is used in males to treat low testosterone levels. This medicine may be used for other purposes; ask your health care provider or pharmacist if you have questions. COMMON BRAND NAME(S): Andro-L.A., Aveed, Delatestryl, Depo-Testosterone, Virilon What should I tell my health care provider before I take this medicine? They need to know if you have any of these conditions: -breast cancer -diabetes -heart disease -kidney disease -liver disease -lung disease -prostate cancer, enlargement -an unusual or allergic reaction to testosterone, other medicines, foods, dyes, or preservatives -pregnant or trying to get pregnant -breast-feeding How should I use this medicine? This medicine is for injection into a muscle. It is usually given by a health care professional in a hospital or clinic setting. Contact your pediatrician regarding the use of this medicine in children. While this medicine may be prescribed for children as young as 12 years of age for selected conditions, precautions do apply. Overdosage: If you think you have taken too much of this medicine contact a poison control center or emergency room at once. NOTE: This medicine is only for you. Do not share this medicine with others. What if I miss a dose? Try not to miss a dose. Your doctor or health care professional will tell you when your next injection is due. Notify the office if you are unable to keep an appointment. What may interact with this medicine? -medicines for diabetes -medicines that treat or prevent blood clots like warfarin -oxyphenbutazone -propranolol -steroid medicines like prednisone or cortisone This list may not describe all possible interactions. Give your health care provider a list of all the medicines, herbs,  non-prescription drugs, or dietary supplements you use. Also tell them if you smoke, drink alcohol, or use illegal drugs. Some items may interact with your medicine. What should I watch for while using this medicine? Visit your doctor or health care professional for regular checks on your progress. They will need to check the level of testosterone in your blood. This medicine may affect blood sugar levels. If you have diabetes, check with your doctor or health care professional before you change your diet or the dose of your diabetic medicine. This drug is banned from use in athletes by most athletic organizations. What side effects may I notice from receiving this medicine? Side effects that you should report to your doctor or health care professional as soon as possible: -allergic reactions like skin rash, itching or hives, swelling of the face, lips, or tongue -breast enlargement -breathing problems -changes in mood, especially anger, depression, or rage -dark urine -general ill feeling or flu-like symptoms -light-colored stools -loss of appetite, nausea -nausea, vomiting -right upper belly pain -stomach pain -swelling of ankles -too frequent or persistent erections -trouble passing urine or change in the amount of urine -unusually weak or tired -yellowing of the eyes or skin Additional side effects that can occur in women include: -deep or hoarse voice -facial hair growth -irregular menstrual periods Side effects that usually do not require medical attention (report to your doctor or health care professional if they continue or are bothersome): -acne -change in sex drive or performance -hair loss -headache This list may not describe all possible side effects. Call your doctor for medical advice about side effects. You may report side effects to FDA at 1-800-FDA-1088. Where should I keep my medicine? Keep   out of the reach of children. This medicine can be abused. Keep your  medicine in a safe place to protect it from theft. Do not share this medicine with anyone. Selling or giving away this medicine is dangerous and against the law. Store at room temperature between 20 and 25 degrees C (68 and 77 degrees F). Do not freeze. Protect from light. Follow the directions for the product you are prescribed. Throw away any unused medicine after the expiration date. NOTE: This sheet is a summary. It may not cover all possible information. If you have questions about this medicine, talk to your doctor, pharmacist, or health care provider.  2014, Elsevier/Gold Standard. (2007-08-24 16:13:46)  

## 2013-12-12 ENCOUNTER — Ambulatory Visit: Payer: BC Managed Care – PPO

## 2013-12-26 ENCOUNTER — Ambulatory Visit (INDEPENDENT_AMBULATORY_CARE_PROVIDER_SITE_OTHER): Payer: BC Managed Care – PPO | Admitting: *Deleted

## 2013-12-26 DIAGNOSIS — E349 Endocrine disorder, unspecified: Secondary | ICD-10-CM

## 2013-12-26 DIAGNOSIS — E291 Testicular hypofunction: Secondary | ICD-10-CM

## 2013-12-26 NOTE — Patient Instructions (Signed)
Testosterone injection What is this medicine? TESTOSTERONE (tes TOS ter one) is the main male hormone. It supports normal male development such as muscle growth, facial hair, and deep voice. It is used in males to treat low testosterone levels. This medicine may be used for other purposes; ask your health care provider or pharmacist if you have questions. COMMON BRAND NAME(S): Andro-L.A., Aveed, Delatestryl, Depo-Testosterone, Virilon What should I tell my health care provider before I take this medicine? They need to know if you have any of these conditions: -breast cancer -diabetes -heart disease -kidney disease -liver disease -lung disease -prostate cancer, enlargement -an unusual or allergic reaction to testosterone, other medicines, foods, dyes, or preservatives -pregnant or trying to get pregnant -breast-feeding How should I use this medicine? This medicine is for injection into a muscle. It is usually given by a health care professional in a hospital or clinic setting. Contact your pediatrician regarding the use of this medicine in children. While this medicine may be prescribed for children as young as 12 years of age for selected conditions, precautions do apply. Overdosage: If you think you have taken too much of this medicine contact a poison control center or emergency room at once. NOTE: This medicine is only for you. Do not share this medicine with others. What if I miss a dose? Try not to miss a dose. Your doctor or health care professional will tell you when your next injection is due. Notify the office if you are unable to keep an appointment. What may interact with this medicine? -medicines for diabetes -medicines that treat or prevent blood clots like warfarin -oxyphenbutazone -propranolol -steroid medicines like prednisone or cortisone This list may not describe all possible interactions. Give your health care provider a list of all the medicines, herbs,  non-prescription drugs, or dietary supplements you use. Also tell them if you smoke, drink alcohol, or use illegal drugs. Some items may interact with your medicine. What should I watch for while using this medicine? Visit your doctor or health care professional for regular checks on your progress. They will need to check the level of testosterone in your blood. This medicine may affect blood sugar levels. If you have diabetes, check with your doctor or health care professional before you change your diet or the dose of your diabetic medicine. This drug is banned from use in athletes by most athletic organizations. What side effects may I notice from receiving this medicine? Side effects that you should report to your doctor or health care professional as soon as possible: -allergic reactions like skin rash, itching or hives, swelling of the face, lips, or tongue -breast enlargement -breathing problems -changes in mood, especially anger, depression, or rage -dark urine -general ill feeling or flu-like symptoms -light-colored stools -loss of appetite, nausea -nausea, vomiting -right upper belly pain -stomach pain -swelling of ankles -too frequent or persistent erections -trouble passing urine or change in the amount of urine -unusually weak or tired -yellowing of the eyes or skin Additional side effects that can occur in women include: -deep or hoarse voice -facial hair growth -irregular menstrual periods Side effects that usually do not require medical attention (report to your doctor or health care professional if they continue or are bothersome): -acne -change in sex drive or performance -hair loss -headache This list may not describe all possible side effects. Call your doctor for medical advice about side effects. You may report side effects to FDA at 1-800-FDA-1088. Where should I keep my medicine? Keep   out of the reach of children. This medicine can be abused. Keep your  medicine in a safe place to protect it from theft. Do not share this medicine with anyone. Selling or giving away this medicine is dangerous and against the law. Store at room temperature between 20 and 25 degrees C (68 and 77 degrees F). Do not freeze. Protect from light. Follow the directions for the product you are prescribed. Throw away any unused medicine after the expiration date. NOTE: This sheet is a summary. It may not cover all possible information. If you have questions about this medicine, talk to your doctor, pharmacist, or health care provider.  2015, Elsevier/Gold Standard. (2007-08-24 16:13:46)  

## 2013-12-26 NOTE — Progress Notes (Signed)
Testosterone injection given and tolerated and given well

## 2014-01-03 ENCOUNTER — Other Ambulatory Visit: Payer: BC Managed Care – PPO

## 2014-01-16 ENCOUNTER — Ambulatory Visit (INDEPENDENT_AMBULATORY_CARE_PROVIDER_SITE_OTHER): Payer: BC Managed Care – PPO | Admitting: *Deleted

## 2014-01-16 DIAGNOSIS — E349 Endocrine disorder, unspecified: Secondary | ICD-10-CM

## 2014-01-16 DIAGNOSIS — E291 Testicular hypofunction: Secondary | ICD-10-CM

## 2014-01-16 NOTE — Patient Instructions (Signed)

## 2014-01-16 NOTE — Progress Notes (Signed)
Testosterone given and tolerated well.  

## 2014-01-17 ENCOUNTER — Ambulatory Visit (INDEPENDENT_AMBULATORY_CARE_PROVIDER_SITE_OTHER): Payer: BC Managed Care – PPO | Admitting: Family Medicine

## 2014-01-17 ENCOUNTER — Other Ambulatory Visit: Payer: BC Managed Care – PPO

## 2014-01-17 ENCOUNTER — Ambulatory Visit (INDEPENDENT_AMBULATORY_CARE_PROVIDER_SITE_OTHER): Payer: BC Managed Care – PPO

## 2014-01-17 ENCOUNTER — Encounter: Payer: Self-pay | Admitting: Family Medicine

## 2014-01-17 VITALS — BP 111/73 | HR 68 | Temp 98.1°F | Ht 69.0 in | Wt 202.6 lb

## 2014-01-17 DIAGNOSIS — E291 Testicular hypofunction: Secondary | ICD-10-CM

## 2014-01-17 DIAGNOSIS — R5381 Other malaise: Secondary | ICD-10-CM

## 2014-01-17 DIAGNOSIS — M129 Arthropathy, unspecified: Secondary | ICD-10-CM

## 2014-01-17 DIAGNOSIS — R5383 Other fatigue: Secondary | ICD-10-CM

## 2014-01-17 DIAGNOSIS — R634 Abnormal weight loss: Secondary | ICD-10-CM

## 2014-01-17 DIAGNOSIS — K219 Gastro-esophageal reflux disease without esophagitis: Secondary | ICD-10-CM

## 2014-01-17 DIAGNOSIS — J301 Allergic rhinitis due to pollen: Secondary | ICD-10-CM

## 2014-01-17 DIAGNOSIS — E785 Hyperlipidemia, unspecified: Secondary | ICD-10-CM

## 2014-01-17 DIAGNOSIS — R21 Rash and other nonspecific skin eruption: Secondary | ICD-10-CM

## 2014-01-17 DIAGNOSIS — M1990 Unspecified osteoarthritis, unspecified site: Secondary | ICD-10-CM

## 2014-01-17 LAB — POCT CBC
Granulocyte percent: 57.6 %G (ref 37–80)
HCT, POC: 47 % (ref 43.5–53.7)
Hemoglobin: 15.1 g/dL (ref 14.1–18.1)
Lymph, poc: 1.4 (ref 0.6–3.4)
MCH, POC: 28.7 pg (ref 27–31.2)
MCHC: 32.3 g/dL (ref 31.8–35.4)
MCV: 88.9 fL (ref 80–97)
MPV: 7.6 fL (ref 0–99.8)
POC Granulocyte: 2.2 (ref 2–6.9)
POC LYMPH PERCENT: 35.1 %L (ref 10–50)
Platelet Count, POC: 227 10*3/uL (ref 142–424)
RBC: 5.3 M/uL (ref 4.69–6.13)
RDW, POC: 13 %
WBC: 3.9 10*3/uL — AB (ref 4.6–10.2)

## 2014-01-17 MED ORDER — MELOXICAM 15 MG PO TABS: 15.0000 mg | ORAL_TABLET | Freq: Every day | ORAL | Status: DC

## 2014-01-17 MED ORDER — ESOMEPRAZOLE MAGNESIUM 40 MG PO CPDR: 40.0000 mg | DELAYED_RELEASE_CAPSULE | Freq: Every day | ORAL | Status: DC

## 2014-01-17 MED ORDER — MONTELUKAST SODIUM 10 MG PO TABS: 10.0000 mg | ORAL_TABLET | Freq: Every day | ORAL | Status: DC

## 2014-01-17 MED ORDER — CLOBETASOL PROPIONATE 0.05 % EX CREA: 1.0000 "application " | TOPICAL_CREAM | Freq: Two times a day (BID) | CUTANEOUS | Status: DC

## 2014-01-17 MED ORDER — POLYETHYLENE GLYCOL 3350 17 GM/SCOOP PO POWD: ORAL | Status: DC

## 2014-01-17 MED ORDER — CETIRIZINE HCL 10 MG PO TABS: 10.0000 mg | ORAL_TABLET | Freq: Every day | ORAL | Status: DC

## 2014-01-17 MED ORDER — ATORVASTATIN CALCIUM 40 MG PO TABS: 40.0000 mg | ORAL_TABLET | Freq: Every day | ORAL | Status: DC

## 2014-01-17 MED ORDER — TRAMADOL HCL (ER BIPHASIC) 300 MG PO TB24: 1.0000 | ORAL_TABLET | Freq: Every day | ORAL | Status: DC

## 2014-01-17 NOTE — Progress Notes (Signed)
   Subjective:    Patient ID: Cesar Leon, male    DOB: [birth year], 53 y.o.   MRN: [id number]  HPI This 53 y.o. male presents for evaluation of hypogonadism, fatigue, hyperlipidemia, back pain And allergic rhinits.  He has had recent weight loss for no apparent reason.   Review of Systems No chest pain, SOB, HA, dizziness, vision change, N/V, diarrhea, constipation, dysuria, urinary urgency or frequency, myalgias, arthralgias or rash.     Objective:   Physical Exam Vital signs noted  Well developed well nourished male.  HEENT - Head atraumatic Normocephalic                Eyes - PERRLA, Conjuctiva - clear Sclera- Clear EOMI                Ears - EAC's Wnl TM's Wnl Gross Hearing WNL                Nose - Nares patent                 Throat - oropharanx wnl Respiratory - Lungs CTA bilateral Cardiac - RRR S1 and S2 without murmur GI - Abdomen soft Nontender and bowel sounds active x 4 Extremities - No edema. Neuro - Grossly intact.       Assessment & Plan:  Hyperlipidemia - Plan: CMP14+EGFR, Lipid panel, atorvastatin (LIPITOR) 40 MG tablet, DISCONTINUED: atorvastatin (LIPITOR) 40 MG tablet  Gastroesophageal reflux disease without esophagitis - Plan: esomeprazole (NEXIUM) 40 MG capsule, DISCONTINUED: esomeprazole (NEXIUM) 40 MG capsule  Weight loss - Plan: DG Chest 2 View, POCT CBC, CMP14+EGFR, Thyroid Panel With TSH  Other fatigue - Plan: DG Chest 2 View, POCT CBC, Thyroid Panel With TSH, Testosterone,Free and Total  Allergic rhinitis due to pollen - Plan: cetirizine (ZYRTEC) 10 MG tablet, montelukast (SINGULAIR) 10 MG tablet  Hypogonadism in male - Plan: Testosterone,Free and Total  Arthritis - Plan: meloxicam (MOBIC) 15 MG tablet  Rash and nonspecific skin eruption - Plan: cetirizine (ZYRTEC) 10 MG tablet, clobetasol cream (TEMOVATE) 0.05 %  Lysbeth Penner FNP

## 2014-01-18 LAB — CMP14+EGFR
ALT: 21 IU/L (ref 0–44)
AST: 18 IU/L (ref 0–40)
Albumin/Globulin Ratio: 2 (ref 1.1–2.5)
Albumin: 4 g/dL (ref 3.5–5.5)
Alkaline Phosphatase: 76 IU/L (ref 39–117)
BUN/Creatinine Ratio: 17 (ref 9–20)
BUN: 16 mg/dL (ref 6–24)
CO2: 25 mmol/L (ref 18–29)
Calcium: 9.1 mg/dL (ref 8.7–10.2)
Chloride: 100 mmol/L (ref 97–108)
Creatinine, Ser: 0.93 mg/dL (ref 0.76–1.27)
GFR calc Af Amer: 108 mL/min/{1.73_m2} (ref >59)
GFR calc non Af Amer: 93 mL/min/{1.73_m2} (ref >59)
Globulin, Total: 2 g/dL (ref 1.5–4.5)
Glucose: 88 mg/dL (ref 65–99)
Potassium: 4.1 mmol/L (ref 3.5–5.2)
Sodium: 142 mmol/L (ref 134–144)
Total Bilirubin: 0.8 mg/dL (ref 0.0–1.2)
Total Protein: 6 g/dL (ref 6.0–8.5)

## 2014-01-18 LAB — TESTOSTERONE,FREE AND TOTAL
Testosterone, Free: 27.3 pg/mL — ABNORMAL HIGH (ref 7.2–24.0)
Testosterone: 914 ng/dL (ref 348–1197)

## 2014-01-18 LAB — THYROID PANEL WITH TSH
Free Thyroxine Index: 2 (ref 1.2–4.9)
T3 Uptake Ratio: 33 % (ref 24–39)
T4, Total: 6.1 ug/dL (ref 4.5–12.0)
TSH: 2.11 u[IU]/mL (ref 0.450–4.500)

## 2014-01-18 LAB — LIPID PANEL
Chol/HDL Ratio: 2.4 ratio units (ref 0.0–5.0)
Cholesterol, Total: 115 mg/dL (ref 100–199)
HDL: 47 mg/dL (ref >39)
LDL Calculated: 52 mg/dL (ref 0–99)
Triglycerides: 81 mg/dL (ref 0–149)
VLDL Cholesterol Cal: 16 mg/dL (ref 5–40)

## 2014-02-06 ENCOUNTER — Ambulatory Visit: Payer: BC Managed Care – PPO

## 2014-02-13 ENCOUNTER — Ambulatory Visit: Payer: BC Managed Care – PPO

## 2014-02-13 ENCOUNTER — Ambulatory Visit (INDEPENDENT_AMBULATORY_CARE_PROVIDER_SITE_OTHER): Payer: BC Managed Care – PPO | Admitting: *Deleted

## 2014-02-13 DIAGNOSIS — E291 Testicular hypofunction: Secondary | ICD-10-CM

## 2014-02-13 DIAGNOSIS — E349 Endocrine disorder, unspecified: Secondary | ICD-10-CM

## 2014-02-13 NOTE — Progress Notes (Signed)
Patient ID: Cesar Leon, male   DOB: Aug 26, 1960, 53 y.o.   MRN: [id number] Pt tolerated inj well

## 2014-02-27 ENCOUNTER — Ambulatory Visit: Payer: BC Managed Care – PPO

## 2014-03-14 ENCOUNTER — Ambulatory Visit: Payer: BC Managed Care – PPO

## 2014-03-17 ENCOUNTER — Ambulatory Visit (INDEPENDENT_AMBULATORY_CARE_PROVIDER_SITE_OTHER): Payer: BC Managed Care – PPO | Admitting: *Deleted

## 2014-03-17 DIAGNOSIS — E291 Testicular hypofunction: Secondary | ICD-10-CM

## 2014-03-17 DIAGNOSIS — E349 Endocrine disorder, unspecified: Secondary | ICD-10-CM

## 2014-03-17 NOTE — Patient Instructions (Signed)

## 2014-03-17 NOTE — Progress Notes (Signed)
Patient ID: Cesar Leon, male   DOB: 07/02/1960, 53 y.o.   MRN: [id number] Patient given testosterone and tolerated well

## 2014-03-20 ENCOUNTER — Ambulatory Visit: Payer: BC Managed Care – PPO

## 2014-04-10 ENCOUNTER — Ambulatory Visit: Payer: BC Managed Care – PPO

## 2014-04-10 ENCOUNTER — Encounter: Payer: Self-pay | Admitting: Family Medicine

## 2014-04-10 ENCOUNTER — Ambulatory Visit (INDEPENDENT_AMBULATORY_CARE_PROVIDER_SITE_OTHER): Payer: BC Managed Care – PPO | Admitting: Family Medicine

## 2014-04-10 VITALS — BP 108/70 | HR 85 | Temp 98.4°F | Ht 69.0 in

## 2014-04-10 DIAGNOSIS — K5732 Diverticulitis of large intestine without perforation or abscess without bleeding: Secondary | ICD-10-CM

## 2014-04-10 DIAGNOSIS — R42 Dizziness and giddiness: Secondary | ICD-10-CM

## 2014-04-10 MED ORDER — ONDANSETRON 8 MG PO TBDP: 8.0000 mg | ORAL_TABLET | Freq: Three times a day (TID) | ORAL | Status: DC | PRN

## 2014-04-10 MED ORDER — CIPROFLOXACIN HCL 500 MG PO TABS: 500.0000 mg | ORAL_TABLET | Freq: Two times a day (BID) | ORAL | Status: DC

## 2014-04-10 MED ORDER — MECLIZINE HCL 25 MG PO TABS: 25.0000 mg | ORAL_TABLET | Freq: Three times a day (TID) | ORAL | Status: DC | PRN

## 2014-04-10 MED ORDER — METRONIDAZOLE 500 MG PO TABS: 500.0000 mg | ORAL_TABLET | Freq: Three times a day (TID) | ORAL | Status: DC

## 2014-04-10 NOTE — Progress Notes (Signed)
   Subjective:    Patient ID: Cesar Leon, male    DOB: [birth year], 53 y.o.   MRN: [id number]  HPI This 53 y.o. male presents for evaluation of persistent LLQ abdominal discomfort for last 2 days. C/o nausea and vertigo sx's.   Review of Systems C/o abdominal pain and vertigo   No chest pain, SOB, HA,  vision change, N/V, diarrhea, constipation, dysuria, urinary urgency or frequency, myalgias, arthralgias or rash.  Objective:   Physical Exam Vital signs noted  Well developed well nourished male.  HEENT - Head atraumatic Normocephalic                Eyes - PERRLA, Conjuctiva - clear Sclera- Clear EOMI                Ears - EAC's Wnl TM's Wnl Gross Hearing WNL                Nose - Nares patent                 Throat - oropharanx wnl Respiratory - Lungs CTA bilateral Cardiac - RRR S1 and S2 without murmur GI - Abdomen soft tender LLQ and bowel sounds active x 4 Extremities - No edema. Neuro - Grossly intact.       Assessment & Plan:  Diverticulitis of colon - Plan: ciprofloxacin (CIPRO) 500 MG tablet, metroNIDAZOLE (FLAGYL) 500 MG tablet  Vertigo - Plan: meclizine (ANTIVERT) 25 MG tablet, ondansetron (ZOFRAN ODT) 8 MG disintegrating tablet  Deatra CanterWilliam J Oxford FNP

## 2014-04-11 ENCOUNTER — Ambulatory Visit: Payer: BC Managed Care – PPO

## 2014-04-16 ENCOUNTER — Ambulatory Visit (INDEPENDENT_AMBULATORY_CARE_PROVIDER_SITE_OTHER): Payer: BC Managed Care – PPO | Admitting: *Deleted

## 2014-04-16 DIAGNOSIS — E349 Endocrine disorder, unspecified: Secondary | ICD-10-CM

## 2014-04-16 DIAGNOSIS — E291 Testicular hypofunction: Secondary | ICD-10-CM

## 2014-04-16 NOTE — Progress Notes (Signed)
Patient ID: Cesar Leon, male   DOB: 06-15-1961, 53 y.o.   MRN: [id number] Testosterone given and tolerated well

## 2014-04-16 NOTE — Patient Instructions (Signed)
Testosterone This test is used to determine if your testosterone level is abnormal. This could be used to explain difficulty getting an erection (erectile dysfunction), inability of your partner to get pregnant (infertility), premature or delayed puberty if you are male, or the appearance of masculine physical features if you are male. PREPARATION FOR TEST A blood sample is obtained by inserting a needle into a vein in the arm. NORMAL FINDINGS  Free Testosterone: 0.3-2 pg/mL  % Free Testosterone: 0.1%-0.3% Total Testosterone:  7 mos-9 yrs (Tanner Stage I)  Male: Less than 30 ng/dL  Male: Less than 30 ng/dL  10-13 yrs (Tanner Stage II)  Male: Less than 300 ng/dL  Male: Less than 40 ng/dL  14-15 yrs (Tanner Stage III)  Male: 170-540 ng/dL  Male: Less than 60 ng/dL  16-19 yrs (Tanner Stage IV, V)  Male: 250-910 ng/dL  Male: Less than 70 ng/dL  20 yrs and over  Male: 280-1080 ng/dL  Male: Less than 70 ng/dL Ranges for normal findings may vary among different laboratories and hospitals. You should always check with your doctor after having lab work or other tests done to discuss the meaning of your test results and whether your values are considered within normal limits. MEANING OF TEST  Your caregiver will go over the test results with you and discuss the importance and meaning of your results, as well as treatment options and the need for additional tests if necessary. OBTAINING THE TEST RESULTS It is your responsibility to obtain your test results. Ask the lab or department performing the test when and how you will get your results. Document Released: 06/30/2004 Document Revised: 09/05/2011 Document Reviewed: 10/09/2013 [employer] Patient Information 2015 [employer]. This information is not intended to replace advice given to you by your health care provider. Make sure you discuss any questions you have with your health care provider.  

## 2014-04-19 ENCOUNTER — Other Ambulatory Visit: Payer: Self-pay | Admitting: Family Medicine

## 2014-05-01 ENCOUNTER — Ambulatory Visit: Payer: BC Managed Care – PPO

## 2014-05-16 ENCOUNTER — Ambulatory Visit: Payer: BC Managed Care – PPO

## 2014-05-20 ENCOUNTER — Ambulatory Visit: Payer: BC Managed Care – PPO | Admitting: *Deleted

## 2014-05-20 ENCOUNTER — Ambulatory Visit (INDEPENDENT_AMBULATORY_CARE_PROVIDER_SITE_OTHER): Payer: BC Managed Care – PPO | Admitting: *Deleted

## 2014-05-20 ENCOUNTER — Ambulatory Visit: Payer: BC Managed Care – PPO

## 2014-05-20 DIAGNOSIS — Z23 Encounter for immunization: Secondary | ICD-10-CM

## 2014-05-20 DIAGNOSIS — E349 Endocrine disorder, unspecified: Secondary | ICD-10-CM

## 2014-05-20 DIAGNOSIS — E291 Testicular hypofunction: Secondary | ICD-10-CM

## 2014-05-20 NOTE — Progress Notes (Signed)
Testosterone injection given and tolerated well.  

## 2014-05-20 NOTE — Patient Instructions (Signed)

## 2014-06-12 ENCOUNTER — Ambulatory Visit: Payer: BC Managed Care – PPO

## 2014-06-13 ENCOUNTER — Ambulatory Visit: Payer: BC Managed Care – PPO

## 2014-06-23 ENCOUNTER — Ambulatory Visit (INDEPENDENT_AMBULATORY_CARE_PROVIDER_SITE_OTHER): Payer: BC Managed Care – PPO | Admitting: *Deleted

## 2014-06-23 DIAGNOSIS — E291 Testicular hypofunction: Secondary | ICD-10-CM

## 2014-06-23 DIAGNOSIS — E349 Endocrine disorder, unspecified: Secondary | ICD-10-CM

## 2014-06-23 NOTE — Patient Instructions (Signed)

## 2014-06-23 NOTE — Progress Notes (Signed)
Testosterone injection given and tolerated well.  

## 2014-07-11 ENCOUNTER — Ambulatory Visit: Payer: BC Managed Care – PPO

## 2014-07-22 ENCOUNTER — Other Ambulatory Visit: Payer: Self-pay | Admitting: Family Medicine

## 2014-07-23 ENCOUNTER — Other Ambulatory Visit: Payer: Self-pay | Admitting: *Deleted

## 2014-07-23 MED ORDER — OMEPRAZOLE 40 MG PO CPDR: 40.0000 mg | DELAYED_RELEASE_CAPSULE | Freq: Every day | ORAL | Status: DC

## 2014-07-23 NOTE — Telephone Encounter (Signed)
Left message refill ultram at front desk to be picked up here

## 2014-07-23 NOTE — Telephone Encounter (Signed)
Last seen 04/10/14 B Oxford  If approved print and route to nurse 

## 2014-07-23 NOTE — Telephone Encounter (Signed)
Pt is requesting we change his Nexium to Prilosec as his insurance no longer covers the nexium. If approved the rx will go over [facility] to [facility] in [place].

## 2014-07-24 ENCOUNTER — Ambulatory Visit (INDEPENDENT_AMBULATORY_CARE_PROVIDER_SITE_OTHER): Payer: BLUE CROSS/BLUE SHIELD | Admitting: *Deleted

## 2014-07-24 DIAGNOSIS — E291 Testicular hypofunction: Secondary | ICD-10-CM

## 2014-07-24 DIAGNOSIS — E349 Endocrine disorder, unspecified: Secondary | ICD-10-CM

## 2014-07-24 NOTE — Progress Notes (Signed)
Testosterone injection given and tolerated well.  

## 2014-07-24 NOTE — Patient Instructions (Signed)

## 2014-07-25 NOTE — Telephone Encounter (Signed)
Pt aware.

## 2014-08-15 ENCOUNTER — Ambulatory Visit: Payer: BC Managed Care – PPO

## 2014-08-18 ENCOUNTER — Other Ambulatory Visit: Payer: Self-pay | Admitting: Family Medicine

## 2014-08-19 NOTE — Telephone Encounter (Signed)
Last seen 04/10/14 B Oxford  If approved print and route to nurse 

## 2014-08-19 NOTE — Telephone Encounter (Signed)
This patient has not been seen since October. He needs to be given an appointment to be seen before further tramadol prescriptions can be written and there needs to be a reason for why we are giving him the tramadol.

## 2014-08-21 ENCOUNTER — Ambulatory Visit: Payer: BLUE CROSS/BLUE SHIELD

## 2014-08-22 ENCOUNTER — Telehealth: Payer: Self-pay | Admitting: Family Medicine

## 2014-08-22 ENCOUNTER — Other Ambulatory Visit: Payer: Self-pay

## 2014-08-22 MED ORDER — TRAMADOL HCL ER 300 MG PO TB24: ORAL_TABLET | ORAL | Status: DC

## 2014-08-22 NOTE — Telephone Encounter (Signed)
Printed and placed up front .

## 2014-08-22 NOTE — Telephone Encounter (Signed)
Ultram rx ready for pick up  

## 2014-08-22 NOTE — Telephone Encounter (Signed)
Last seen 04/11/15 B Oxford  Upcoming appt in March  Dr Darlyn ReadStacks   If approved print and route to nurse

## 2014-08-25 ENCOUNTER — Ambulatory Visit: Payer: BLUE CROSS/BLUE SHIELD

## 2014-08-25 NOTE — Telephone Encounter (Signed)
Patient notified that rx up front ready for pick up 

## 2014-08-26 ENCOUNTER — Ambulatory Visit (INDEPENDENT_AMBULATORY_CARE_PROVIDER_SITE_OTHER): Payer: BLUE CROSS/BLUE SHIELD | Admitting: *Deleted

## 2014-08-26 DIAGNOSIS — E291 Testicular hypofunction: Secondary | ICD-10-CM | POA: Diagnosis not present

## 2014-08-26 DIAGNOSIS — E349 Endocrine disorder, unspecified: Secondary | ICD-10-CM

## 2014-08-26 NOTE — Patient Instructions (Signed)

## 2014-08-26 NOTE — Progress Notes (Signed)
Testosterone injection given and tolerated well.  

## 2014-09-04 ENCOUNTER — Ambulatory Visit: Payer: BLUE CROSS/BLUE SHIELD | Admitting: Family Medicine

## 2014-09-12 ENCOUNTER — Ambulatory Visit: Payer: BC Managed Care – PPO

## 2014-09-21 ENCOUNTER — Other Ambulatory Visit: Payer: Self-pay | Admitting: Family Medicine

## 2014-09-22 ENCOUNTER — Encounter: Payer: Self-pay | Admitting: Family Medicine

## 2014-09-22 ENCOUNTER — Ambulatory Visit (INDEPENDENT_AMBULATORY_CARE_PROVIDER_SITE_OTHER): Payer: Federal, State, Local not specified - PPO | Admitting: Family Medicine

## 2014-09-22 VITALS — BP 130/78 | HR 68 | Temp 97.6°F | Ht 69.0 in | Wt 209.0 lb

## 2014-09-22 DIAGNOSIS — M545 Low back pain, unspecified: Secondary | ICD-10-CM | POA: Insufficient documentation

## 2014-09-22 DIAGNOSIS — E349 Endocrine disorder, unspecified: Secondary | ICD-10-CM

## 2014-09-22 DIAGNOSIS — E785 Hyperlipidemia, unspecified: Secondary | ICD-10-CM | POA: Insufficient documentation

## 2014-09-22 DIAGNOSIS — E291 Testicular hypofunction: Secondary | ICD-10-CM | POA: Diagnosis not present

## 2014-09-22 LAB — POCT CBC
GRANULOCYTE PERCENT: 69.3 % (ref 37–80)
HCT, POC: 50.3 % (ref 43.5–53.7)
HEMOGLOBIN: 15.5 g/dL (ref 14.1–18.1)
Lymph, poc: 1.3 (ref 0.6–3.4)
MCH, POC: 27.6 pg (ref 27–31.2)
MCHC: 30.9 g/dL — AB (ref 31.8–35.4)
MCV: 89.2 fL (ref 80–97)
MPV: 6.8 fL (ref 0–99.8)
POC GRANULOCYTE: 3.4 (ref 2–6.9)
POC LYMPH PERCENT: 27.2 %L (ref 10–50)
Platelet Count, POC: 245 10*3/uL (ref 142–424)
RBC: 5.64 M/uL (ref 4.69–6.13)
RDW, POC: 12.6 %
WBC: 4.9 10*3/uL (ref 4.6–10.2)

## 2014-09-22 MED ORDER — CYCLOBENZAPRINE HCL 10 MG PO TABS: 10.0000 mg | ORAL_TABLET | Freq: Three times a day (TID) | ORAL | Status: DC | PRN

## 2014-09-22 MED ORDER — HYDROCODONE-ACETAMINOPHEN 5-325 MG PO TABS: 1.0000 | ORAL_TABLET | Freq: Four times a day (QID) | ORAL | Status: DC | PRN

## 2014-09-22 MED ORDER — TRAMADOL HCL (ER BIPHASIC) 300 MG PO TB24: 1.0000 | ORAL_TABLET | Freq: Every day | ORAL | Status: DC

## 2014-09-22 NOTE — Progress Notes (Signed)
Subjective:  Patient ID: Cesar Leon, male    DOB: 1960-10-16  Age: 54 y.o. MRN: [id number]  CC: Hyperlipidemia; Testosterone Deficiency; and Back Pain   HPI Cesar Leon presents for follow-up of hypogonadism. He continues to get the shots of testosterone. He is most concerned about osteoporosis. Admits that his muscle strength has diminished. Sense of well-being is adequate. Denies side effects such as swelling or shortness of breath related to possible clot formation.  Back pain continues to be in a 6-7/10 range. He takes a hydrocodone if he goes to an 8 or above. This occurs on average 5 or 6 times a month but can have episodes on occasion that require more. The pain is located in the lower back it is present off and on for several years. He has "bone on bone" L5/S1 disease. This was related to him by a provider who had ordered MRI apparently 3-5 years ago. It does not radiate to the extremities.  Patient in for follow-up of elevated cholesterol. Doing well without complaints on current medication. Denies side effects of statin including myalgia and arthralgia and nausea. Also in today for liver function testing. Currently no chest pain, shortness of breath or other cardiovascular related symptoms noted.  History Cesar Leon has a past medical history of Hypothyroidism; Hypogonadism male; Allergic rhinitis; Chronic pain; Hyperlipidemia; GERD (gastroesophageal reflux disease); DDD (degenerative disc disease) (12/28/08); Hematuria; Femur fracture, right; Internal hemorrhoid; History of GI bleed; and PVD (peripheral vascular disease).   He has past surgical history that includes Carpal tunnel release (8/11) and right ankle  (1976).   His family history includes Anxiety disorder in his father; Depression in his father; Diabetes in his brother and father; Lung cancer in his father; Peripheral vascular disease in his father.He reports that he has quit smoking. He does not have any smokeless  tobacco history on file. He reports that he drinks alcohol. He reports that he does not use illicit drugs.  Current Outpatient Prescriptions on File Prior to Visit  Medication Sig Dispense Refill  . atorvastatin (LIPITOR) 40 MG tablet Take 1 tablet (40 mg total) by mouth daily. 90 tablet 4  . Calcium Carb-Cholecalciferol (CALCIUM + D3 PO) Take by mouth.    . cetirizine (ZYRTEC) 10 MG tablet Take 1 tablet (10 mg total) by mouth daily. 90 tablet 1  . clobetasol cream (TEMOVATE) 0.05 % Apply 1 application topically 2 (two) times daily. 30 g 2  . fluticasone (FLONASE) 50 MCG/ACT nasal spray Place 2 sprays into both nostrils at bedtime. 48 g 11  . meloxicam (MOBIC) 15 MG tablet TAKE ONE (1) TABLET EACH DAY 30 tablet 0  . Methylcellulose, Laxative, 500 MG TABS Take 2 tablets by mouth daily.    . montelukast (SINGULAIR) 10 MG tablet Take 1 tablet (10 mg total) by mouth at bedtime. 90 tablet 3  . Multiple Vitamins-Minerals (CENTRUM SILVER PO) Take 1 tablet by mouth daily.    Marland Kitchen omeprazole (PRILOSEC) 40 MG capsule Take 1 capsule (40 mg total) by mouth daily. 90 capsule 2  . polyethylene glycol powder (GLYCOLAX/MIRALAX) powder MIX 17 GRAMS INTO 8OZ OF WATER AND DRINK DAILY 527 g 11  . testosterone cypionate (DEPOTESTOTERONE CYPIONATE) 200 MG/ML injection Inject one ml every 3 weeks 10 mL 3   Current Facility-Administered Medications on File Prior to Visit  Medication Dose Route Frequency Provider Last Rate Last Dose  . testosterone cypionate (DEPOTESTOTERONE CYPIONATE) injection 200 mg  200 mg Intramuscular Q21 days Deatra Canter,  FNP   200 mg at 09/22/14 1011    ROS Review of Systems  Constitutional: Negative for fever, chills, diaphoresis and unexpected weight change.  HENT: Negative for congestion, hearing loss, rhinorrhea, sore throat and trouble swallowing.   Respiratory: Negative for cough, chest tightness, shortness of breath and wheezing.   Gastrointestinal: Negative for nausea, vomiting,  abdominal pain, diarrhea, constipation and abdominal distention.  Endocrine: Negative for cold intolerance and heat intolerance.  Genitourinary: Negative for dysuria, hematuria and flank pain.  Musculoskeletal: Positive for back pain and arthralgias. Negative for joint swelling.  Skin: Negative for rash.  Neurological: Negative for dizziness and headaches.  Psychiatric/Behavioral: Negative for dysphoric mood, decreased concentration and agitation. The patient is not nervous/anxious.     Objective:  BP 130/78 mmHg  Pulse 68  Temp(Src) 97.6 F (36.4 C) (Oral)  Ht $R'5\' 9"'dX$  (1.753 m)  Wt 209 lb (94.802 kg)  BMI 30.85 kg/m2  BP Readings from Last 3 Encounters:  09/22/14 130/78  04/10/14 108/70  01/17/14 111/73    Wt Readings from Last 3 Encounters:  09/22/14 209 lb (94.802 kg)  01/17/14 202 lb 9.6 oz (91.899 kg)  10/11/13 211 lb 6.4 oz (95.89 kg)     Physical Exam  Constitutional: He is oriented to person, place, and time. He appears well-developed and well-nourished. No distress.  HENT:  Head: Normocephalic and atraumatic.  Right Ear: External ear normal.  Left Ear: External ear normal.  Nose: Nose normal.  Mouth/Throat: Oropharynx is clear and moist.  Eyes: Conjunctivae and EOM are normal. Pupils are equal, round, and reactive to light.  Neck: Normal range of motion. Neck supple. No thyromegaly present.  Cardiovascular: Normal rate, regular rhythm and normal heart sounds.   No murmur heard. Pulmonary/Chest: Effort normal and breath sounds normal. No respiratory distress. He has no wheezes. He has no rales.  Abdominal: Soft. Bowel sounds are normal. He exhibits no distension. There is no tenderness.  Lymphadenopathy:    He has no cervical adenopathy.  Neurological: He is alert and oriented to person, place, and time. He has normal reflexes.  Skin: Skin is warm and dry.  Psychiatric: He has a normal mood and affect. His behavior is normal. Judgment and thought content normal.     No results found for: HGBA1C  Lab Results  Component Value Date   WBC 4.9 09/22/2014   HGB 15.5 09/22/2014   HCT 50.3 09/22/2014   GLUCOSE 88 01/17/2014   CHOL 115 01/17/2014   TRIG 81 01/17/2014   HDL 47 01/17/2014   LDLCALC 52 01/17/2014   ALT 21 01/17/2014   AST 18 01/17/2014   NA 142 01/17/2014   K 4.1 01/17/2014   CL 100 01/17/2014   CREATININE 0.93 01/17/2014   BUN 16 01/17/2014   CO2 25 01/17/2014   TSH 2.110 01/17/2014   PSA 0.7 03/29/2013    [name]  08/09/2007   Clinical Data: Decreased testosterone level.   MRI BRAIN AND SELLA WITHOUT AND WITH CONTRAST:  Technique: Multiplanar and multiecho pulse sequences of the brain and surrounding structures were obtained according to standard protocol before and after administration of intravenous contrast.  Contrast: 14 cc Magnevist.  Findings: The brain itself has a normal appearance on all pulse sequences without evidence of atrophy, stroke, mass, hemorrhage, hydrocephalus, or extra-axial collection.   The pituitary gland is within normal limits in size measuring 4.4 cm cephalocaudal and 8.0 cm front to back. The enhancement pattern is normal. No evidence  of adenoma. No abnormal brain enhancement occurs.   IMPRESSION:  Normal examination. No abnormality of the hypothalamus or pituitary identified.    Provider: Lavonia Dana   Assessment & Plan:   Cesar Leon was seen today for hyperlipidemia, testosterone deficiency and back pain.  Diagnoses and all orders for this visit:  Hyperlipemia Orders: -     POCT CBC -     CMP14+EGFR -     Lipid panel  Testosterone deficiency Orders: -     PSA, total and free -     Testosterone,Free and Total  Hypogonadism male  Right-sided low back pain without sciatica  Other orders -     TraMADol HCl 300 MG TB24; Take 1 tablet by mouth daily. -     HYDROcodone-acetaminophen (NORCO/VICODIN) 5-325 MG per tablet; Take 1 tablet by mouth every 6 (six) hours as needed for  moderate pain. -     cyclobenzaprine (FLEXERIL) 10 MG tablet; Take 1 tablet (10 mg total) by mouth 3 (three) times daily as needed for muscle spasms.   I have discontinued Cesar Leon meclizine, ciprofloxacin, metroNIDAZOLE, ondansetron, traMADol, traMADol, NEXIUM, and traMADol. I am also having him start on cyclobenzaprine. Additionally, I am having him maintain his Calcium Carb-Cholecalciferol (CALCIUM + D3 PO), Methylcellulose (Laxative), Multiple Vitamins-Minerals (CENTRUM SILVER PO), fluticasone, testosterone cypionate, cetirizine, atorvastatin, clobetasol cream, montelukast, polyethylene glycol powder, meloxicam, omeprazole, TraMADol HCl, and HYDROcodone-acetaminophen. We administered testosterone cypionate. We will continue to administer testosterone cypionate.  Meds ordered this encounter  Medications  . DISCONTD: NEXIUM 40 MG capsule    Sig:   . DISCONTD: HYDROcodone-acetaminophen (NORCO/VICODIN) 5-325 MG per tablet    Sig: Take 1 tablet by mouth every 6 (six) hours as needed for moderate pain.  . TraMADol HCl 300 MG TB24    Sig: Take 1 tablet by mouth daily.    Dispense:  30 tablet    Refill:  4  . DISCONTD: traMADol (ULTRAM-ER) 300 MG 24 hr tablet    Sig: Take 1 tablet by mouth daily.  Marland Kitchen HYDROcodone-acetaminophen (NORCO/VICODIN) 5-325 MG per tablet    Sig: Take 1 tablet by mouth every 6 (six) hours as needed for moderate pain.    Dispense:  30 tablet    Refill:  0  . cyclobenzaprine (FLEXERIL) 10 MG tablet    Sig: Take 1 tablet (10 mg total) by mouth 3 (three) times daily as needed for muscle spasms.    Dispense:  90 tablet    Refill:  1     Follow-up: Return in about 3 months (around 12/23/2014).  Claretta Fraise, M.D.

## 2014-09-24 LAB — PSA, TOTAL AND FREE
PSA, Free Pct: 25 %
PSA, Free: 0.1 ng/mL
PSA: 0.4 ng/mL (ref 0.0–4.0)

## 2014-09-24 LAB — CMP14+EGFR
ALBUMIN: 4.2 g/dL (ref 3.5–5.5)
ALT: 27 IU/L (ref 0–44)
AST: 20 IU/L (ref 0–40)
Albumin/Globulin Ratio: 2.1 (ref 1.1–2.5)
Alkaline Phosphatase: 74 IU/L (ref 39–117)
BUN/Creatinine Ratio: 20 (ref 9–20)
BUN: 20 mg/dL (ref 6–24)
Bilirubin Total: 0.9 mg/dL (ref 0.0–1.2)
CALCIUM: 9.2 mg/dL (ref 8.7–10.2)
CO2: 27 mmol/L (ref 18–29)
Chloride: 102 mmol/L (ref 97–108)
Creatinine, Ser: 0.99 mg/dL (ref 0.76–1.27)
GFR calc Af Amer: 99 mL/min/{1.73_m2} (ref >59)
GFR, EST NON AFRICAN AMERICAN: 86 mL/min/{1.73_m2} (ref >59)
Globulin, Total: 2 g/dL (ref 1.5–4.5)
Glucose: 106 mg/dL — ABNORMAL HIGH (ref 65–99)
POTASSIUM: 4.4 mmol/L (ref 3.5–5.2)
Sodium: 141 mmol/L (ref 134–144)
Total Protein: 6.2 g/dL (ref 6.0–8.5)

## 2014-09-24 LAB — TESTOSTERONE,FREE AND TOTAL
Testosterone, Free: 4.4 pg/mL — ABNORMAL LOW (ref 7.2–24.0)
Testosterone: 247 ng/dL — ABNORMAL LOW (ref 348–1197)

## 2014-09-24 LAB — LIPID PANEL
Chol/HDL Ratio: 2.4 ratio units (ref 0.0–5.0)
Cholesterol, Total: 129 mg/dL (ref 100–199)
HDL: 54 mg/dL (ref >39)
LDL Calculated: 57 mg/dL (ref 0–99)
TRIGLYCERIDES: 88 mg/dL (ref 0–149)
VLDL Cholesterol Cal: 18 mg/dL (ref 5–40)

## 2014-09-26 ENCOUNTER — Telehealth: Payer: Self-pay | Admitting: *Deleted

## 2014-09-26 ENCOUNTER — Encounter: Payer: Self-pay | Admitting: Family Medicine

## 2014-09-26 NOTE — Telephone Encounter (Signed)
-----   Message from Mechele ClaudeWarren Stacks, MD sent at 09/24/2014  4:16 PM EDT ----- Cesar LaxHello Cesar Leon,    Your lab result is normal.Some minor variations that are not significant are commonly marked abnormal, but do not represent any medical problem for you.  Best regards, Mechele ClaudeWarren Stacks, M.D.

## 2014-09-29 ENCOUNTER — Encounter: Payer: Self-pay | Admitting: Family Medicine

## 2014-10-20 ENCOUNTER — Other Ambulatory Visit: Payer: Self-pay | Admitting: Family Medicine

## 2014-10-29 ENCOUNTER — Ambulatory Visit (INDEPENDENT_AMBULATORY_CARE_PROVIDER_SITE_OTHER): Payer: Federal, State, Local not specified - PPO | Admitting: *Deleted

## 2014-10-29 DIAGNOSIS — E291 Testicular hypofunction: Secondary | ICD-10-CM | POA: Diagnosis not present

## 2014-10-29 DIAGNOSIS — E349 Endocrine disorder, unspecified: Secondary | ICD-10-CM

## 2014-10-29 NOTE — Progress Notes (Signed)
Pt given testosterone IM LUOQ and tolerated well.

## 2014-10-29 NOTE — Patient Instructions (Signed)

## 2014-12-01 ENCOUNTER — Ambulatory Visit: Payer: Federal, State, Local not specified - PPO

## 2014-12-03 ENCOUNTER — Ambulatory Visit: Payer: Federal, State, Local not specified - PPO

## 2014-12-08 ENCOUNTER — Ambulatory Visit (INDEPENDENT_AMBULATORY_CARE_PROVIDER_SITE_OTHER): Payer: Federal, State, Local not specified - PPO | Admitting: *Deleted

## 2014-12-08 DIAGNOSIS — E349 Endocrine disorder, unspecified: Secondary | ICD-10-CM

## 2014-12-08 DIAGNOSIS — E291 Testicular hypofunction: Secondary | ICD-10-CM

## 2014-12-08 NOTE — Patient Instructions (Signed)

## 2014-12-08 NOTE — Progress Notes (Signed)
Testosterone injection given and tolerated well.  

## 2015-01-07 ENCOUNTER — Ambulatory Visit (INDEPENDENT_AMBULATORY_CARE_PROVIDER_SITE_OTHER): Payer: Federal, State, Local not specified - PPO | Admitting: *Deleted

## 2015-01-07 DIAGNOSIS — E291 Testicular hypofunction: Secondary | ICD-10-CM

## 2015-01-07 DIAGNOSIS — E349 Endocrine disorder, unspecified: Secondary | ICD-10-CM

## 2015-01-07 NOTE — Progress Notes (Signed)
Testosterone injection given and tolerated well.  

## 2015-01-07 NOTE — Patient Instructions (Signed)

## 2015-01-19 ENCOUNTER — Other Ambulatory Visit: Payer: Self-pay | Admitting: Family

## 2015-01-21 ENCOUNTER — Other Ambulatory Visit: Payer: Self-pay | Admitting: *Deleted

## 2015-01-21 NOTE — Telephone Encounter (Signed)
Last filled 12/22/14, lat seen 09/22/14. Rx will print

## 2015-01-22 MED ORDER — TRAMADOL HCL (ER BIPHASIC) 300 MG PO TB24: 1.0000 | ORAL_TABLET | Freq: Every day | ORAL | Status: DC

## 2015-02-06 ENCOUNTER — Ambulatory Visit (INDEPENDENT_AMBULATORY_CARE_PROVIDER_SITE_OTHER): Payer: Federal, State, Local not specified - PPO | Admitting: *Deleted

## 2015-02-06 DIAGNOSIS — E291 Testicular hypofunction: Secondary | ICD-10-CM

## 2015-02-06 DIAGNOSIS — E349 Endocrine disorder, unspecified: Secondary | ICD-10-CM

## 2015-02-06 NOTE — Progress Notes (Signed)
Testosterone injection given and tolerated well.  

## 2015-02-06 NOTE — Patient Instructions (Signed)

## 2015-02-18 ENCOUNTER — Other Ambulatory Visit: Payer: Self-pay | Admitting: Family Medicine

## 2015-02-19 ENCOUNTER — Other Ambulatory Visit: Payer: Self-pay | Admitting: Family Medicine

## 2015-02-19 ENCOUNTER — Other Ambulatory Visit: Payer: Self-pay

## 2015-02-19 DIAGNOSIS — E785 Hyperlipidemia, unspecified: Secondary | ICD-10-CM

## 2015-02-19 DIAGNOSIS — J301 Allergic rhinitis due to pollen: Secondary | ICD-10-CM

## 2015-02-19 MED ORDER — MONTELUKAST SODIUM 10 MG PO TABS
10.0000 mg | ORAL_TABLET | Freq: Every day | ORAL | Status: DC
Start: 2015-02-19 — End: 2015-05-21

## 2015-02-19 MED ORDER — POLYETHYLENE GLYCOL 3350 17 GM/SCOOP PO POWD: ORAL | Status: DC

## 2015-02-19 MED ORDER — ATORVASTATIN CALCIUM 40 MG PO TABS: 40.0000 mg | ORAL_TABLET | Freq: Every day | ORAL | Status: DC

## 2015-02-19 MED ORDER — TRAMADOL HCL (ER BIPHASIC) 300 MG PO TB24: 1.0000 | ORAL_TABLET | Freq: Every day | ORAL | Status: DC

## 2015-02-19 NOTE — Telephone Encounter (Signed)
Please make sure pt has appt for next month. This is the last fill of tramadol he can have prior to next office visit. Thanks, WS

## 2015-02-19 NOTE — Telephone Encounter (Signed)
Due for follow up in late September. Please set up appt.

## 2015-02-19 NOTE — Telephone Encounter (Signed)
Last seen 09/22/14  Dr Stacks  If approved print 

## 2015-03-09 ENCOUNTER — Ambulatory Visit (INDEPENDENT_AMBULATORY_CARE_PROVIDER_SITE_OTHER): Payer: Federal, State, Local not specified - PPO | Admitting: *Deleted

## 2015-03-09 DIAGNOSIS — E291 Testicular hypofunction: Secondary | ICD-10-CM

## 2015-03-09 DIAGNOSIS — E349 Endocrine disorder, unspecified: Secondary | ICD-10-CM

## 2015-03-09 NOTE — Progress Notes (Signed)
Patient tolerated well.

## 2015-03-23 ENCOUNTER — Other Ambulatory Visit: Payer: Self-pay | Admitting: Family Medicine

## 2015-04-10 ENCOUNTER — Ambulatory Visit (INDEPENDENT_AMBULATORY_CARE_PROVIDER_SITE_OTHER): Payer: Federal, State, Local not specified - PPO | Admitting: *Deleted

## 2015-04-10 DIAGNOSIS — E291 Testicular hypofunction: Secondary | ICD-10-CM

## 2015-04-10 DIAGNOSIS — Z23 Encounter for immunization: Secondary | ICD-10-CM | POA: Diagnosis not present

## 2015-04-10 NOTE — Patient Instructions (Signed)

## 2015-04-10 NOTE — Progress Notes (Signed)
Testosterone injection given and tolerated well.  

## 2015-04-20 ENCOUNTER — Other Ambulatory Visit: Payer: Self-pay | Admitting: Family Medicine

## 2015-04-21 ENCOUNTER — Other Ambulatory Visit: Payer: Self-pay

## 2015-04-21 MED ORDER — OMEPRAZOLE 40 MG PO CPDR: 40.0000 mg | DELAYED_RELEASE_CAPSULE | Freq: Every day | ORAL | Status: DC

## 2015-04-21 NOTE — Telephone Encounter (Signed)
Last seen 09/22/14  Dr Darlyn ReadStacks  If approved print

## 2015-04-28 ENCOUNTER — Ambulatory Visit: Payer: Federal, State, Local not specified - PPO | Admitting: Family Medicine

## 2015-04-29 ENCOUNTER — Ambulatory Visit (INDEPENDENT_AMBULATORY_CARE_PROVIDER_SITE_OTHER): Payer: Federal, State, Local not specified - PPO

## 2015-04-29 ENCOUNTER — Ambulatory Visit (INDEPENDENT_AMBULATORY_CARE_PROVIDER_SITE_OTHER): Payer: Federal, State, Local not specified - PPO | Admitting: Family Medicine

## 2015-04-29 ENCOUNTER — Encounter: Payer: Self-pay | Admitting: Family Medicine

## 2015-04-29 ENCOUNTER — Other Ambulatory Visit: Payer: Self-pay | Admitting: Family Medicine

## 2015-04-29 VITALS — BP 120/87 | HR 104 | Temp 97.6°F | Ht 69.0 in | Wt 202.2 lb

## 2015-04-29 DIAGNOSIS — E291 Testicular hypofunction: Secondary | ICD-10-CM

## 2015-04-29 DIAGNOSIS — R52 Pain, unspecified: Secondary | ICD-10-CM | POA: Diagnosis not present

## 2015-04-29 DIAGNOSIS — J40 Bronchitis, not specified as acute or chronic: Secondary | ICD-10-CM | POA: Diagnosis not present

## 2015-04-29 DIAGNOSIS — M48061 Spinal stenosis, lumbar region without neurogenic claudication: Secondary | ICD-10-CM | POA: Insufficient documentation

## 2015-04-29 DIAGNOSIS — M4806 Spinal stenosis, lumbar region: Secondary | ICD-10-CM

## 2015-04-29 DIAGNOSIS — J329 Chronic sinusitis, unspecified: Secondary | ICD-10-CM

## 2015-04-29 DIAGNOSIS — E785 Hyperlipidemia, unspecified: Secondary | ICD-10-CM | POA: Diagnosis not present

## 2015-04-29 MED ORDER — TRAMADOL HCL 50 MG PO TABS: ORAL_TABLET | ORAL | Status: DC

## 2015-04-29 MED ORDER — LEVOFLOXACIN 500 MG PO TABS: 500.0000 mg | ORAL_TABLET | Freq: Every day | ORAL | Status: DC

## 2015-04-29 MED ORDER — BENZONATATE 200 MG PO CAPS: 200.0000 mg | ORAL_CAPSULE | Freq: Three times a day (TID) | ORAL | Status: DC | PRN

## 2015-04-29 NOTE — Addendum Note (Signed)
Addended by: Mechele ClaudeSTACKS, Garlon Tuggle on: 04/29/2015 11:56 AM   Modules accepted: Orders, SmartSet

## 2015-04-29 NOTE — Progress Notes (Addendum)
Subjective:  Patient ID: Cesar Leon, male    DOB: [birth year]  Age: 54 y.o. MRN: [id number]  CC: Hyperlipidemia; testosterone deficiency; and Back Pain   HPI Cesar Leon presents for follow-up of elevated cholesterol. Doing well without complaints on current medication. Denies side effects of statin including myalgia and arthralgia and nausea. Also in today for liver function testing. Currently no chest pain, shortness of breath or other cardiovascular related symptoms noted. He does have L5 bone on bone arthritis. He is currently taking the tramadol 300 daily but not the Mobic. He is still using the Flexeril. He is not doing back exercises. History Cesar Leon has a past medical history of Hypothyroidism; Hypogonadism male; Allergic rhinitis; Chronic pain; Hyperlipidemia; GERD (gastroesophageal reflux disease); DDD (degenerative disc disease) (12/28/08); Hematuria; Femur fracture, right (HCC); Internal hemorrhoid; History of GI bleed; and PVD (peripheral vascular disease) (HCC).   He has past surgical history that includes Carpal tunnel release (8/11) and right ankle  (1976).   His family history includes Anxiety disorder in his father; Depression in his father; Diabetes in his brother and father; Lung cancer in his father; Peripheral vascular disease in his father.He reports that he has quit smoking. He does not have any smokeless tobacco history on file. He reports that he drinks alcohol. He reports that he does not use illicit drugs.  Current Outpatient Prescriptions on File Prior to Visit  Medication Sig Dispense Refill  . atorvastatin (LIPITOR) 40 MG tablet Take 1 tablet (40 mg total) by mouth daily. 90 tablet 0  . Calcium Carb-Cholecalciferol (CALCIUM + D3 PO) Take by mouth.    . cetirizine (ZYRTEC) 10 MG tablet Take 1 tablet (10 mg total) by mouth daily. 90 tablet 1  . clobetasol cream (TEMOVATE) 0.05 % Apply 1 application topically 2 (two) times daily. 30 g 2  . fluticasone  (FLONASE) 50 MCG/ACT nasal spray USE 2 SPRAYS IN EACH NOSTRIL AT BEDTIME 16 g 1  . meloxicam (MOBIC) 15 MG tablet TAKE ONE (1) TABLET EACH DAY 30 tablet 0  . Methylcellulose, Laxative, 500 MG TABS Take 2 tablets by mouth daily.    . montelukast (SINGULAIR) 10 MG tablet Take 1 tablet (10 mg total) by mouth at bedtime. 90 tablet 0  . Multiple Vitamins-Minerals (CENTRUM SILVER PO) Take 1 tablet by mouth daily.    Marland Kitchen omeprazole (PRILOSEC) 40 MG capsule Take 1 capsule (40 mg total) by mouth daily. 90 capsule 0  . polyethylene glycol powder (GLYCOLAX/MIRALAX) powder MIX 17 GRAMS INTO 8OZ OF WATER AND DRINK DAILY 527 g 2   Current Facility-Administered Medications on File Prior to Visit  Medication Dose Route Frequency Provider Last Rate Last Dose  . testosterone cypionate (DEPOTESTOTERONE CYPIONATE) injection 200 mg  200 mg Intramuscular Q21 days Deatra Canter, FNP   200 mg at 04/10/15 1037    ROS Review of Systems  Constitutional: Negative for fever, chills, diaphoresis, activity change and appetite change.  HENT: Positive for postnasal drip and sinus pressure. Negative for congestion, ear discharge, ear pain, hearing loss, nosebleeds, rhinorrhea, sneezing, sore throat and trouble swallowing.   Respiratory: Positive for cough. Negative for chest tightness, shortness of breath and wheezing.   Cardiovascular: Negative for chest pain and palpitations.  Gastrointestinal: Negative for nausea, vomiting, abdominal pain, diarrhea, constipation and abdominal distention.  Genitourinary: Negative for dysuria and frequency.  Musculoskeletal: Negative for joint swelling and arthralgias.  Skin: Negative for rash.  Neurological: Negative for headaches.    Objective:  BP  120/87 mmHg  Pulse 104  Temp(Src) 97.6 F (36.4 C) (Oral)  Ht $R'5\' 9"'vh$  (1.753 m)  Wt 202 lb 3.2 oz (91.717 kg)  BMI 29.85 kg/m2  SpO2 99%  BP Readings from Last 3 Encounters:  04/29/15 120/87  09/22/14 130/78  04/10/14 108/70     Wt Readings from Last 3 Encounters:  04/29/15 202 lb 3.2 oz (91.717 kg)  09/22/14 209 lb (94.802 kg)  01/17/14 202 lb 9.6 oz (91.899 kg)     Physical Exam  Constitutional: He is oriented to person, place, and time. He appears well-developed and well-nourished. No distress.  HENT:  Head: Normocephalic and atraumatic.  Right Ear: External ear normal.  Left Ear: External ear normal.  Nose: Nose normal.  Mouth/Throat: Oropharynx is clear and moist.  Eyes: Conjunctivae and EOM are normal. Pupils are equal, round, and reactive to light.  Neck: Normal range of motion. Neck supple. No thyromegaly present.  Cardiovascular: Normal rate, regular rhythm and normal heart sounds.   No murmur heard. Pulmonary/Chest: Effort normal and breath sounds normal. No respiratory distress. He has no wheezes. He has no rales.  Abdominal: Soft. Bowel sounds are normal. He exhibits no distension. There is no tenderness.  Lymphadenopathy:    He has no cervical adenopathy.  Neurological: He is alert and oriented to person, place, and time. He has normal reflexes.  Skin: Skin is warm and dry.  Psychiatric: He has a normal mood and affect. His behavior is normal. Judgment and thought content normal.    No results found for: HGBA1C  Lab Results  Component Value Date   WBC 4.9 09/22/2014   HGB 15.5 09/22/2014   HCT 50.3 09/22/2014   GLUCOSE 106* 09/22/2014   CHOL 129 09/22/2014   TRIG 88 09/22/2014   HDL 54 09/22/2014   LDLCALC 57 09/22/2014   ALT 27 09/22/2014   AST 20 09/22/2014   NA 141 09/22/2014   K 4.4 09/22/2014   CL 102 09/22/2014   CREATININE 0.99 09/22/2014   BUN 20 09/22/2014   CO2 27 09/22/2014   TSH 2.110 01/17/2014   PSA 0.4 09/22/2014    [name]  08/09/2007  Clinical Data: Decreased testosterone level.  MRI BRAIN AND SELLA WITHOUT AND WITH CONTRAST: Technique: Multiplanar and multiecho pulse sequences of the brain and surrounding structures were obtained  according to standard protocol before and after administration of intravenous contrast. Contrast: 14 cc Magnevist. Findings: The brain itself has a normal appearance on all pulse sequences without evidence of atrophy, stroke, mass, hemorrhage, hydrocephalus, or extra-axial collection.  The pituitary gland is within normal limits in size measuring 4.4 cm cephalocaudal and 8.0 cm front to back. The enhancement pattern is normal. No evidence of adenoma. No abnormal brain enhancement occurs.  IMPRESSION: Normal examination. No abnormality of the hypothalamus or pituitary identified.  Provider: Lavonia Dana   Assessment & Plan:   Vyron was seen today for hyperlipidemia, testosterone deficiency and back pain.  Diagnoses and all orders for this visit:  Hypogonadism male -     CMP14+EGFR -     Testosterone,Free and Total -     NMR, lipoprofile  Hyperlipemia -     CMP14+EGFR -     Testosterone,Free and Total -     NMR, lipoprofile  Spinal stenosis of lumbar region  Sinobronchitis  Other orders -     traMADol (ULTRAM) 50 MG tablet; Take 1 tablet 5 times a day for 1 week. Then decrease to 1  tablet 4 times daily for 1 week. Then decrease to 1 tablet 3 times daily for 1 week. Then decrease to 1 tablet twice daily for 1 week. Then decrease to 1 tablet daily for 1 week. Then discontinue the medication -     levofloxacin (LEVAQUIN) 500 MG tablet; Take 1 tablet (500 mg total) by mouth daily. -     benzonatate (TESSALON) 200 MG capsule; Take 1 capsule (200 mg total) by mouth 3 (three) times daily as needed for cough.   I have discontinued Mr. Commisso testosterone cypionate, HYDROcodone-acetaminophen, cyclobenzaprine, and TraMADol HCl. I am also having him start on traMADol, levofloxacin, and benzonatate. Additionally, I am having him maintain his Calcium Carb-Cholecalciferol (CALCIUM + D3 PO), Methylcellulose (Laxative), Multiple Vitamins-Minerals (CENTRUM SILVER PO), cetirizine, clobetasol cream,  fluticasone, meloxicam, montelukast, atorvastatin, polyethylene glycol powder, and omeprazole. We will continue to administer testosterone cypionate.  Meds ordered this encounter  Medications  . traMADol (ULTRAM) 50 MG tablet    Sig: Take 1 tablet 5 times a day for 1 week. Then decrease to 1 tablet 4 times daily for 1 week. Then decrease to 1 tablet 3 times daily for 1 week. Then decrease to 1 tablet twice daily for 1 week. Then decrease to 1 tablet daily for 1 week. Then discontinue the medication    Dispense:  105 tablet    Refill:  02  . levofloxacin (LEVAQUIN) 500 MG tablet    Sig: Take 1 tablet (500 mg total) by mouth daily.    Dispense:  7 tablet    Refill:  0  . benzonatate (TESSALON) 200 MG capsule    Sig: Take 1 capsule (200 mg total) by mouth 3 (three) times daily as needed for cough.    Dispense:  20 capsule    Refill:  0   Taper off of the tramadol by changing to the immediate release and taking 1 tablet 5 times a day for 1 week. Then reduce to 1 tablet 4 times daily for a week. Then one 3 times daily for a week. Then one 2 times  daily for a week. Then 1 daily for a week then discontinue the medication.  Rely on the meloxicam for back pain relief once daily. If needed I can add a supplement to this called Cymbalta for added relief.  Discontinue the cyclobenzaprine/Flexeril and do back exercises instead as below: Chart review gives me know confirmation of his claim of lumbar spinal stenosis and disc disease. Will see about getting records from other sources and or have to do an MRI of his lower back. Consider that at follow-up in 3 months if he is unable to taper off of the tramadol. Also if symptoms increase significantly.. Sooner as needed. Follow-up: Return in about 6 months (around 10/27/2015).  Claretta Fraise, M.D.

## 2015-04-29 NOTE — Patient Instructions (Addendum)
Taper off of the tramadol by changing to the immediate release and taking 1 tablet 5 times a day for 1 week. Then reduce to 1 tablet 4 times daily for a week. Then one 3 times daily for a week. Then one 2 times  daily for a week. Then 1 daily for a week then discontinue the medication.  Rely on the meloxicam for back pain relief once daily. If needed I can add a supplement to this called Cymbalta for added relief.  Discontinue the cyclobenzaprine/Flexeril and do back exercises instead as below:   Back Exercises The following exercises strengthen the muscles that help to support the back. They also help to keep the lower back flexible. Doing these exercises can help to prevent back pain or lessen existing pain. If you have back pain or discomfort, try doing these exercises 2-3 times each day or as told by your health care provider. When the pain goes away, do them once each day, but increase the number of times that you repeat the steps for each exercise (do more repetitions). If you do not have back pain or discomfort, do these exercises once each day or as told by your health care provider. EXERCISES Single Knee to Chest Repeat these steps 3-5 times for each leg: 1. Lie on your back on a firm bed or the floor with your legs extended. 2. Bring one knee to your chest. Your other leg should stay extended and in contact with the floor. 3. Hold your knee in place by grabbing your knee or thigh. 4. Pull on your knee until you feel a gentle stretch in your lower back. 5. Hold the stretch for 10-30 seconds. 6. Slowly release and straighten your leg. Pelvic Tilt Repeat these steps 5-10 times: 1. Lie on your back on a firm bed or the floor with your legs extended. 2. Bend your knees so they are pointing toward the ceiling and your feet are flat on the floor. 3. Tighten your lower abdominal muscles to press your lower back against the floor. This motion will tilt your pelvis so your tailbone points up  toward the ceiling instead of pointing to your feet or the floor. 4. With gentle tension and even breathing, hold this position for 5-10 seconds. Cat-Cow Repeat these steps until your lower back becomes more flexible: 1. Get into a hands-and-knees position on a firm surface. Keep your hands under your shoulders, and keep your knees under your hips. You may place padding under your knees for comfort. 2. Let your head hang down, and point your tailbone toward the floor so your lower back becomes rounded like the back of a cat. 3. Hold this position for 5 seconds. 4. Slowly lift your head and point your tailbone up toward the ceiling so your back forms a sagging arch like the back of a cow. 5. Hold this position for 5 seconds. Press-Ups Repeat these steps 5-10 times: 1. Lie on your abdomen (face-down) on the floor. 2. Place your palms near your head, about shoulder-width apart. 3. While you keep your back as relaxed as possible and keep your hips on the floor, slowly straighten your arms to raise the top half of your body and lift your shoulders. Do not use your back muscles to raise your upper torso. You may adjust the placement of your hands to make yourself more comfortable. 4. Hold this position for 5 seconds while you keep your back relaxed. 5. Slowly return to lying flat on the  floor. Bridges Repeat these steps 10 times: 1. Lie on your back on a firm surface. 2. Bend your knees so they are pointing toward the ceiling and your feet are flat on the floor. 3. Tighten your buttocks muscles and lift your buttocks off of the floor until your waist is at almost the same height as your knees. You should feel the muscles working in your buttocks and the back of your thighs. If you do not feel these muscles, slide your feet 1-2 inches farther away from your buttocks. 4. Hold this position for 3-5 seconds. 5. Slowly lower your hips to the starting position, and allow your buttocks muscles to relax  completely. If this exercise is too easy, try doing it with your arms crossed over your chest. Abdominal Crunches Repeat these steps 5-10 times: 1. Lie on your back on a firm bed or the floor with your legs extended. 2. Bend your knees so they are pointing toward the ceiling and your feet are flat on the floor. 3. Cross your arms over your chest. 4. Tip your chin slightly toward your chest without bending your neck. 5. Tighten your abdominal muscles and slowly raise your trunk (torso) high enough to lift your shoulder blades a tiny bit off of the floor. Avoid raising your torso higher than that, because it can put too much stress on your low back and it does not help to strengthen your abdominal muscles. 6. Slowly return to your starting position. Back Lifts Repeat these steps 5-10 times: 1. Lie on your abdomen (face-down) with your arms at your sides, and rest your forehead on the floor. 2. Tighten the muscles in your legs and your buttocks. 3. Slowly lift your chest off of the floor while you keep your hips pressed to the floor. Keep the back of your head in line with the curve in your back. Your eyes should be looking at the floor. 4. Hold this position for 3-5 seconds. 5. Slowly return to your starting position. SEEK MEDICAL CARE IF:  Your back pain or discomfort gets much worse when you do an exercise.  Your back pain or discomfort does not lessen within 2 hours after you exercise. If you have any of these problems, stop doing these exercises right away. Do not do them again unless your health care provider says that you can. SEEK IMMEDIATE MEDICAL CARE IF:  You develop sudden, severe back pain. If this happens, stop doing the exercises right away. Do not do them again unless your health care provider says that you can.   This information is not intended to replace advice given to you by your health care provider. Make sure you discuss any questions you have with your health care  provider.   Document Released: 07/21/2004 Document Revised: 03/04/2015 Document Reviewed: 08/07/2014 Elsevier Interactive Patient Education [employer]

## 2015-04-30 LAB — CMP14+EGFR
ALK PHOS: 89 IU/L (ref 39–117)
ALT: 18 IU/L (ref 0–44)
AST: 18 IU/L (ref 0–40)
Albumin/Globulin Ratio: 1.7 (ref 1.1–2.5)
Albumin: 4.1 g/dL (ref 3.5–5.5)
BUN/Creatinine Ratio: 20 (ref 9–20)
BUN: 17 mg/dL (ref 6–24)
Bilirubin Total: 0.6 mg/dL (ref 0.0–1.2)
CALCIUM: 9 mg/dL (ref 8.7–10.2)
CO2: 27 mmol/L (ref 18–29)
CREATININE: 0.83 mg/dL (ref 0.76–1.27)
Chloride: 98 mmol/L (ref 97–106)
GFR calc Af Amer: 115 mL/min/{1.73_m2} (ref >59)
GFR, EST NON AFRICAN AMERICAN: 99 mL/min/{1.73_m2} (ref >59)
GLOBULIN, TOTAL: 2.4 g/dL (ref 1.5–4.5)
Glucose: 96 mg/dL (ref 65–99)
POTASSIUM: 4.4 mmol/L (ref 3.5–5.2)
SODIUM: 139 mmol/L (ref 136–144)
Total Protein: 6.5 g/dL (ref 6.0–8.5)

## 2015-04-30 LAB — NMR, LIPOPROFILE
Cholesterol: 115 mg/dL (ref 100–199)
HDL CHOLESTEROL BY NMR: 37 mg/dL — AB (ref >39)
HDL PARTICLE NUMBER: 29.5 umol/L — AB (ref >=30.5)
LDL Particle Number: 758 nmol/L (ref <1000)
LDL Size: 20.6 nm (ref >20.5)
LDL-C: 61 mg/dL (ref 0–99)
LP-IR SCORE: 57 — AB (ref <=45)
SMALL LDL PARTICLE NUMBER: 343 nmol/L (ref <=527)
Triglycerides by NMR: 85 mg/dL (ref 0–149)

## 2015-04-30 LAB — TESTOSTERONE,FREE AND TOTAL
TESTOSTERONE FREE: 4.4 pg/mL — AB (ref 7.2–24.0)
TESTOSTERONE: 187 ng/dL — AB (ref 348–1197)

## 2015-05-03 ENCOUNTER — Other Ambulatory Visit: Payer: Self-pay | Admitting: Family Medicine

## 2015-05-03 MED ORDER — ANDROGEL 20.25 MG/1.25GM (1.62%) TD GEL: TRANSDERMAL | Status: DC

## 2015-05-05 ENCOUNTER — Telehealth: Payer: Self-pay | Admitting: Family Medicine

## 2015-05-05 MED ORDER — TESTOSTERONE CYPIONATE 200 MG/ML IM SOLN: 200.0000 mg | INTRAMUSCULAR | Status: DC

## 2015-05-05 NOTE — Telephone Encounter (Signed)
Spoke with pt Insurance will not cover Androgel He would like to stay on injection

## 2015-05-05 NOTE — Telephone Encounter (Signed)
Yes it is. If he would prefer to stay with the injection though that's okay also. I just got the impression from our conversation that he might like the change.

## 2015-05-11 ENCOUNTER — Other Ambulatory Visit: Payer: Self-pay | Admitting: *Deleted

## 2015-05-11 MED ORDER — MELOXICAM 15 MG PO TABS: ORAL_TABLET | ORAL | Status: DC

## 2015-05-21 ENCOUNTER — Other Ambulatory Visit: Payer: Self-pay | Admitting: Family Medicine

## 2015-06-10 ENCOUNTER — Ambulatory Visit (INDEPENDENT_AMBULATORY_CARE_PROVIDER_SITE_OTHER): Payer: Federal, State, Local not specified - PPO

## 2015-06-10 ENCOUNTER — Other Ambulatory Visit: Payer: Self-pay

## 2015-06-10 DIAGNOSIS — E291 Testicular hypofunction: Secondary | ICD-10-CM

## 2015-06-10 DIAGNOSIS — R7989 Other specified abnormal findings of blood chemistry: Secondary | ICD-10-CM

## 2015-06-10 MED ORDER — TESTOSTERONE CYPIONATE 200 MG/ML IM SOLN: 200.0000 mg | INTRAMUSCULAR | Status: DC

## 2015-06-23 ENCOUNTER — Other Ambulatory Visit: Payer: Self-pay | Admitting: Family Medicine

## 2015-06-23 NOTE — Telephone Encounter (Signed)
Last filled 05/21/15, same directions? Rx will print

## 2015-07-14 ENCOUNTER — Ambulatory Visit (INDEPENDENT_AMBULATORY_CARE_PROVIDER_SITE_OTHER): Payer: Federal, State, Local not specified - PPO | Admitting: *Deleted

## 2015-07-14 DIAGNOSIS — E291 Testicular hypofunction: Secondary | ICD-10-CM

## 2015-07-14 DIAGNOSIS — R7989 Other specified abnormal findings of blood chemistry: Secondary | ICD-10-CM

## 2015-07-14 NOTE — Progress Notes (Signed)
Testosterone injection given and tolerated well.  

## 2015-07-14 NOTE — Patient Instructions (Signed)

## 2015-07-22 ENCOUNTER — Other Ambulatory Visit: Payer: Self-pay | Admitting: Family Medicine

## 2015-08-03 ENCOUNTER — Ambulatory Visit: Payer: Federal, State, Local not specified - PPO | Admitting: Family Medicine

## 2015-08-05 ENCOUNTER — Ambulatory Visit (INDEPENDENT_AMBULATORY_CARE_PROVIDER_SITE_OTHER): Payer: Federal, State, Local not specified - PPO

## 2015-08-05 ENCOUNTER — Encounter: Payer: Self-pay | Admitting: Family Medicine

## 2015-08-05 ENCOUNTER — Ambulatory Visit (INDEPENDENT_AMBULATORY_CARE_PROVIDER_SITE_OTHER): Payer: Federal, State, Local not specified - PPO | Admitting: Family Medicine

## 2015-08-05 VITALS — BP 107/68 | HR 68 | Temp 98.3°F | Ht 69.0 in | Wt 217.6 lb

## 2015-08-05 DIAGNOSIS — M542 Cervicalgia: Secondary | ICD-10-CM

## 2015-08-05 DIAGNOSIS — E785 Hyperlipidemia, unspecified: Secondary | ICD-10-CM | POA: Diagnosis not present

## 2015-08-05 DIAGNOSIS — E291 Testicular hypofunction: Secondary | ICD-10-CM

## 2015-08-05 DIAGNOSIS — K219 Gastro-esophageal reflux disease without esophagitis: Secondary | ICD-10-CM | POA: Diagnosis not present

## 2015-08-05 DIAGNOSIS — M48061 Spinal stenosis, lumbar region without neurogenic claudication: Secondary | ICD-10-CM

## 2015-08-05 DIAGNOSIS — M4806 Spinal stenosis, lumbar region: Secondary | ICD-10-CM

## 2015-08-05 MED ORDER — BUPRENORPHINE 10 MCG/HR TD PTWK: 10.0000 ug | MEDICATED_PATCH | TRANSDERMAL | Status: DC

## 2015-08-05 NOTE — Addendum Note (Signed)
Addended by: Bearl Mulberry on: 08/05/2015 09:48 AM   Modules accepted: Orders, SmartSet

## 2015-08-05 NOTE — Progress Notes (Signed)
Subjective:  Patient ID: Cesar Leon, male    DOB: [birth year]  Age: 55 y.o. MRN: [id number]  CC: Hyperlipidemia; Gastroesophageal Reflux; and Hypogonadism   HPI Cesar Leon presents for follow-up of elevated cholesterol. Doing well without complaints on current medication. Denies side effects of statin including myalgia and arthralgia and nausea. Also in today for liver function testing. Currently no chest pain, shortness of breath or other cardiovascular related symptoms noted.  Patient in for follow-up of GERD. Currently asymptomatic taking  PPI daily. There is no chest pain or heartburn. No hematemesis and no melena. No dysphagia or choking. Onset is remote. Progression is stable. Complicating factors, none.   Patient also using testosterone injections for hypogonadism. Today he is in stating energy is good. Tolerating the shots well. Libido is in normal range. Strength and muscle tone are adequate. Denies any recent fractures  Patient continues to have lower back pain. It radiates into the back of the thighs bilaterally. This been going on for several years. An old MRI of his lower back said he had a herniated disc and 2 vertebrae that were bone-on-bone. He went through a series of epidural injections which were too expensive and didn't help so they were discontinued. Since that time he's been maintained on the opiate-based pain meds. He is taking 3 a day but sometimes has significant to severe breakthrough pain between doses. Therefore he had to go back to 4 a day recently. Pain drops to 3/10 after taking the tramadol. The pain relief lasts 6 or 7 hours. It then goes to a-9/10. Patient states he is actually taking all the pills at the same time to get the 3/10 relief. Pain is 8/10 for 17-18 hours a day after that.   History Cesar Leon has a past medical history of Hypothyroidism; Hypogonadism male; Allergic rhinitis; Chronic pain; Hyperlipidemia; GERD (gastroesophageal reflux  disease); DDD (degenerative disc disease) (12/28/08); Hematuria; Femur fracture, right ([place]); Internal hemorrhoid; History of GI bleed; and PVD (peripheral vascular disease) ([place]).   He has past surgical history that includes Carpal tunnel release (8/11) and right ankle  (1976).   His family history includes Anxiety disorder in his father; Depression in his father; Diabetes in his brother and father; Lung cancer in his father; Peripheral vascular disease in his father.He reports that he has quit smoking. He does not have any smokeless tobacco history on file. He reports that he drinks alcohol. He reports that he does not use illicit drugs.  Current Outpatient Prescriptions on File Prior to Visit  Medication Sig Dispense Refill  . atorvastatin (LIPITOR) 40 MG tablet TAKE ONE (1) TABLET EACH DAY 90 tablet 1  . Calcium Carb-Cholecalciferol (CALCIUM + D3 PO) Take by mouth.    . cetirizine (ZYRTEC) 10 MG tablet Take 1 tablet (10 mg total) by mouth daily. 90 tablet 1  . clobetasol cream (TEMOVATE) 6.78 % Apply 1 application topically 2 (two) times daily. 30 g 2  . fluticasone (FLONASE) 50 MCG/ACT nasal spray USE 2 SPRAYS IN EACH NOSTRIL AT BEDTIME 16 g 5  . meloxicam (MOBIC) 15 MG tablet TAKE ONE (1) TABLET EACH DAY 30 tablet 1  . Methylcellulose, Laxative, 500 MG TABS Take 2 tablets by mouth daily.    . montelukast (SINGULAIR) 10 MG tablet TAKE ONE TABLET DAILY AT BEDTIME 90 tablet 1  . Multiple Vitamins-Minerals (CENTRUM SILVER PO) Take 1 tablet by mouth daily.    Marland Kitchen omeprazole (PRILOSEC) 40 MG capsule TAKE ONE (1) CAPSULE EACH DAY  90 capsule 1  . polyethylene glycol powder (GLYCOLAX/MIRALAX) powder MIX 17 GRAMS INTO 8OZ OF WATER AND DRINK DAILY 527 g 5  . testosterone cypionate (DEPOTESTOSTERONE CYPIONATE) 200 MG/ML injection Inject 1 mL (200 mg total) into the muscle every 28 (twenty-eight) days. 10 mL 2  . traMADol (ULTRAM) 50 MG tablet TAKE 1 TAB 5 TIMES DAILY FOR 1 WEEK THENTAKE 1 TAB 4 TIMES DAILY  FOR 1 WEEK THENTAKE 1 TAB 3 TIMES DAILY FOR 1 WEEK THENTAKE 1 TWICE DAILY X 105 tablet 0   Current Facility-Administered Medications on File Prior to Visit  Medication Dose Route Frequency Provider Last Rate Last Dose  . testosterone cypionate (DEPOTESTOTERONE CYPIONATE) injection 200 mg  200 mg Intramuscular Q21 days Lysbeth Penner, FNP   200 mg at 07/14/15 0945    ROS Review of Systems  Constitutional: Negative for fever, chills, diaphoresis and unexpected weight change.  HENT: Negative for congestion, hearing loss, rhinorrhea and sore throat.   Eyes: Negative for visual disturbance.  Respiratory: Negative for cough and shortness of breath.   Cardiovascular: Negative for chest pain.  Gastrointestinal: Negative for abdominal pain, diarrhea and constipation.  Genitourinary: Negative for dysuria and flank pain.  Musculoskeletal: Positive for back pain and neck pain. Negative for joint swelling and arthralgias.  Skin: Negative for rash.  Neurological: Negative for dizziness and headaches.  Psychiatric/Behavioral: Negative for sleep disturbance and dysphoric mood.    Objective:  BP 107/68 mmHg  Pulse 68  Temp(Src) 98.3 F (36.8 C) (Oral)  Ht _0  (1.753 m)  Wt 217 lb 9.6 oz (98.703 kg)  BMI 32.12 kg/m2  SpO2 97%  BP Readings from Last 3 Encounters:  08/05/15 107/68  04/29/15 120/87  09/22/14 130/78    Wt Readings from Last 3 Encounters:  08/05/15 217 lb 9.6 oz (98.703 kg)  04/29/15 202 lb 3.2 oz (91.717 kg)  09/22/14 209 lb (94.802 kg)     Physical Exam  Constitutional: He is oriented to person, place, and time. He appears well-developed and well-nourished. No distress.  HENT:  Head: Normocephalic and atraumatic.  Right Ear: External ear normal.  Left Ear: External ear normal.  Nose: Nose normal.  Mouth/Throat: Oropharynx is clear and moist.  Eyes: Conjunctivae and EOM are normal. Pupils are equal, round, and reactive to light.  Neck: Normal range of motion. Neck  supple. No thyromegaly present.  Cardiovascular: Normal rate, regular rhythm and normal heart sounds.   No murmur heard. Pulmonary/Chest: Effort normal and breath sounds normal. No respiratory distress. He has no wheezes. He has no rales.  Abdominal: Soft. Bowel sounds are normal. He exhibits no distension. There is no tenderness.  Lymphadenopathy:    He has no cervical adenopathy.  Neurological: He is alert and oriented to person, place, and time. He has normal reflexes.  Skin: Skin is warm and dry.  Psychiatric: He has a normal mood and affect. His behavior is normal. Judgment and thought content normal.    No results found for: HGBA1C  Lab Results  Component Value Date   WBC 4.9 09/22/2014   HGB 15.5 09/22/2014   HCT 50.3 09/22/2014   GLUCOSE 96 04/29/2015   CHOL 115 04/29/2015   TRIG 85 04/29/2015   HDL 37* 04/29/2015   LDLCALC 57 09/22/2014   ALT 18 04/29/2015   AST 18 04/29/2015   NA 139 04/29/2015   K 4.4 04/29/2015   CL 98 04/29/2015   CREATININE 0.83 04/29/2015   BUN 17 04/29/2015   CO2  27 04/29/2015   TSH 2.110 01/17/2014   PSA 0.4 09/22/2014    [name]  08/09/2007  Clinical Data: Decreased testosterone level.  MRI BRAIN AND SELLA WITHOUT AND WITH CONTRAST: Technique: Multiplanar and multiecho pulse sequences of the brain and surrounding structures were obtained according to standard protocol before and after administration of intravenous contrast. Contrast: 14 cc Magnevist. Findings: The brain itself has a normal appearance on all pulse sequences without evidence of atrophy, stroke, mass, hemorrhage, hydrocephalus, or extra-axial collection.  The pituitary gland is within normal limits in size measuring 4.4 cm cephalocaudal and 8.0 cm front to back. The enhancement pattern is normal. No evidence of adenoma. No abnormal brain enhancement occurs.  IMPRESSION: Normal examination. No abnormality of the hypothalamus or pituitary identified.  Provider: Lavonia Dana   Assessment & Plan:   Maxey was seen today for hyperlipidemia, gastroesophageal reflux and hypogonadism.  Diagnoses and all orders for this visit:  Hypogonadism male -     CMP14+EGFR; Standing -     CBC with Differential/Platelet; Standing -     Testosterone,Free and Total; Standing  Hyperlipemia -     CMP14+EGFR; Standing -     Lipid panel; Standing  Spinal stenosis of lumbar region -     CMP14+EGFR; Standing  Gastroesophageal reflux disease without esophagitis -     CMP14+EGFR; Standing -     CBC with Differential/Platelet; Standing  Cervicalgia -     DG Cervical Spine Complete; Future  Other orders -     buprenorphine (BUTRANS) 10 MCG/HR PTWK patch; Place 1 patch (10 mcg total) onto the skin once a week.   I have discontinued Cesar Leon levofloxacin, benzonatate, and ANDROGEL. I am also having him start on buprenorphine. Additionally, I am having him maintain his Calcium Carb-Cholecalciferol (CALCIUM + D3 PO), Methylcellulose (Laxative), Multiple Vitamins-Minerals (CENTRUM SILVER PO), cetirizine, clobetasol cream, montelukast, fluticasone, polyethylene glycol powder, atorvastatin, testosterone cypionate, traMADol, omeprazole, and meloxicam. We will continue to administer testosterone cypionate.  Meds ordered this encounter  Medications  . buprenorphine (BUTRANS) 10 MCG/HR PTWK patch    Sig: Place 1 patch (10 mcg total) onto the skin once a week.    Dispense:  4 patch    Refill:  0     Follow-up: Return in 4 weeks (on 09/02/2015) for Pain - initial visit for opiates.  Claretta Fraise, M.D.

## 2015-08-06 LAB — CBC WITH DIFFERENTIAL/PLATELET
BASOS: 0 %
Basophils Absolute: 0 10*3/uL (ref 0.0–0.2)
EOS (ABSOLUTE): 0.1 10*3/uL (ref 0.0–0.4)
EOS: 2 %
HEMATOCRIT: 46.9 % (ref 37.5–51.0)
Hemoglobin: 16 g/dL (ref 12.6–17.7)
IMMATURE GRANS (ABS): 0 10*3/uL (ref 0.0–0.1)
IMMATURE GRANULOCYTES: 0 %
LYMPHS: 34 %
Lymphocytes Absolute: 1.6 10*3/uL (ref 0.7–3.1)
MCH: 29.6 pg (ref 26.6–33.0)
MCHC: 34.1 g/dL (ref 31.5–35.7)
MCV: 87 fL (ref 79–97)
MONOCYTES: 7 %
MONOS ABS: 0.4 10*3/uL (ref 0.1–0.9)
NEUTROS PCT: 57 %
Neutrophils Absolute: 2.7 10*3/uL (ref 1.4–7.0)
Platelets: 230 10*3/uL (ref 150–379)
RBC: 5.4 x10E6/uL (ref 4.14–5.80)
RDW: 13.9 % (ref 12.3–15.4)
WBC: 4.7 10*3/uL (ref 3.4–10.8)

## 2015-08-06 LAB — CMP14+EGFR
ALBUMIN: 4.3 g/dL (ref 3.5–5.5)
ALK PHOS: 90 IU/L (ref 39–117)
ALT: 24 IU/L (ref 0–44)
AST: 19 IU/L (ref 0–40)
Albumin/Globulin Ratio: 2.2 (ref 1.1–2.5)
BUN / CREAT RATIO: 16 (ref 9–20)
BUN: 15 mg/dL (ref 6–24)
Bilirubin Total: 0.8 mg/dL (ref 0.0–1.2)
CHLORIDE: 100 mmol/L (ref 96–106)
CO2: 27 mmol/L (ref 18–29)
Calcium: 9.1 mg/dL (ref 8.7–10.2)
Creatinine, Ser: 0.95 mg/dL (ref 0.76–1.27)
GFR calc non Af Amer: 90 mL/min/{1.73_m2} (ref >59)
GFR, EST AFRICAN AMERICAN: 104 mL/min/{1.73_m2} (ref >59)
GLUCOSE: 105 mg/dL — AB (ref 65–99)
Globulin, Total: 2 g/dL (ref 1.5–4.5)
POTASSIUM: 4.3 mmol/L (ref 3.5–5.2)
SODIUM: 142 mmol/L (ref 134–144)
Total Protein: 6.3 g/dL (ref 6.0–8.5)

## 2015-08-06 LAB — LIPID PANEL
CHOLESTEROL TOTAL: 126 mg/dL (ref 100–199)
Chol/HDL Ratio: 2.6 ratio units (ref 0.0–5.0)
HDL: 49 mg/dL (ref >39)
LDL Calculated: 61 mg/dL (ref 0–99)
Triglycerides: 78 mg/dL (ref 0–149)
VLDL CHOLESTEROL CAL: 16 mg/dL (ref 5–40)

## 2015-08-06 LAB — TESTOSTERONE,FREE AND TOTAL
TESTOSTERONE FREE: 4.9 pg/mL — AB (ref 7.2–24.0)
Testosterone: 126 ng/dL — ABNORMAL LOW (ref 348–1197)

## 2015-08-07 ENCOUNTER — Other Ambulatory Visit: Payer: Self-pay | Admitting: Family Medicine

## 2015-08-07 DIAGNOSIS — E291 Testicular hypofunction: Secondary | ICD-10-CM

## 2015-08-12 ENCOUNTER — Other Ambulatory Visit: Payer: Federal, State, Local not specified - PPO

## 2015-08-12 DIAGNOSIS — E785 Hyperlipidemia, unspecified: Secondary | ICD-10-CM

## 2015-08-12 DIAGNOSIS — K219 Gastro-esophageal reflux disease without esophagitis: Secondary | ICD-10-CM

## 2015-08-12 DIAGNOSIS — E291 Testicular hypofunction: Secondary | ICD-10-CM

## 2015-08-12 DIAGNOSIS — M48061 Spinal stenosis, lumbar region without neurogenic claudication: Secondary | ICD-10-CM

## 2015-08-13 ENCOUNTER — Ambulatory Visit (INDEPENDENT_AMBULATORY_CARE_PROVIDER_SITE_OTHER): Payer: Federal, State, Local not specified - PPO | Admitting: *Deleted

## 2015-08-13 DIAGNOSIS — E291 Testicular hypofunction: Secondary | ICD-10-CM

## 2015-08-13 NOTE — Progress Notes (Signed)
Testosterone injection given and tolerated well.  

## 2015-08-13 NOTE — Patient Instructions (Signed)

## 2015-08-15 LAB — CBC WITH DIFFERENTIAL/PLATELET
Basophils Absolute: 0 10*3/uL (ref 0.0–0.2)
Basos: 0 %
EOS (ABSOLUTE): 0.1 10*3/uL (ref 0.0–0.4)
EOS: 2 %
HEMATOCRIT: 46.1 % (ref 37.5–51.0)
Hemoglobin: 15.8 g/dL (ref 12.6–17.7)
IMMATURE GRANULOCYTES: 0 %
Immature Grans (Abs): 0 10*3/uL (ref 0.0–0.1)
Lymphocytes Absolute: 2.2 10*3/uL (ref 0.7–3.1)
Lymphs: 42 %
MCH: 28.9 pg (ref 26.6–33.0)
MCHC: 34.3 g/dL (ref 31.5–35.7)
MCV: 84 fL (ref 79–97)
MONOS ABS: 0.5 10*3/uL (ref 0.1–0.9)
Monocytes: 9 %
NEUTROS PCT: 47 %
Neutrophils Absolute: 2.5 10*3/uL (ref 1.4–7.0)
PLATELETS: 231 10*3/uL (ref 150–379)
RBC: 5.46 x10E6/uL (ref 4.14–5.80)
RDW: 13 % (ref 12.3–15.4)
WBC: 5.3 10*3/uL (ref 3.4–10.8)

## 2015-08-15 LAB — CMP14+EGFR
A/G RATIO: 1.9 (ref 1.1–2.5)
ALBUMIN: 4.1 g/dL (ref 3.5–5.5)
ALT: 19 IU/L (ref 0–44)
AST: 19 IU/L (ref 0–40)
Alkaline Phosphatase: 83 IU/L (ref 39–117)
BILIRUBIN TOTAL: 0.7 mg/dL (ref 0.0–1.2)
BUN / CREAT RATIO: 18 (ref 9–20)
BUN: 17 mg/dL (ref 6–24)
CHLORIDE: 99 mmol/L (ref 96–106)
CO2: 26 mmol/L (ref 18–29)
Calcium: 9.2 mg/dL (ref 8.7–10.2)
Creatinine, Ser: 0.97 mg/dL (ref 0.76–1.27)
GFR calc non Af Amer: 88 mL/min/{1.73_m2} (ref >59)
GFR, EST AFRICAN AMERICAN: 101 mL/min/{1.73_m2} (ref >59)
GLOBULIN, TOTAL: 2.2 g/dL (ref 1.5–4.5)
Glucose: 115 mg/dL — ABNORMAL HIGH (ref 65–99)
POTASSIUM: 3.9 mmol/L (ref 3.5–5.2)
SODIUM: 141 mmol/L (ref 134–144)
TOTAL PROTEIN: 6.3 g/dL (ref 6.0–8.5)

## 2015-08-15 LAB — HEPATITIS C ANTIBODY: Hep C Virus Ab: 0.4 s/co ratio (ref 0.0–0.9)

## 2015-08-15 LAB — TESTOSTERONE,FREE AND TOTAL
TESTOSTERONE FREE: 5.6 pg/mL — AB (ref 7.2–24.0)
Testosterone: 272 ng/dL — ABNORMAL LOW (ref 348–1197)

## 2015-08-15 LAB — SPECIMEN STATUS REPORT

## 2015-08-15 LAB — LIPID PANEL
Chol/HDL Ratio: 2.9 ratio units (ref 0.0–5.0)
Cholesterol, Total: 132 mg/dL (ref 100–199)
HDL: 46 mg/dL (ref >39)
LDL Calculated: 67 mg/dL (ref 0–99)
Triglycerides: 97 mg/dL (ref 0–149)
VLDL CHOLESTEROL CAL: 19 mg/dL (ref 5–40)

## 2015-08-17 ENCOUNTER — Other Ambulatory Visit: Payer: Self-pay | Admitting: Family Medicine

## 2015-08-17 MED ORDER — TESTOSTERONE CYPIONATE 200 MG/ML IM SOLN: 200.0000 mg | INTRAMUSCULAR | Status: DC

## 2015-08-27 ENCOUNTER — Ambulatory Visit (INDEPENDENT_AMBULATORY_CARE_PROVIDER_SITE_OTHER): Payer: Federal, State, Local not specified - PPO | Admitting: *Deleted

## 2015-08-27 DIAGNOSIS — E291 Testicular hypofunction: Secondary | ICD-10-CM | POA: Diagnosis not present

## 2015-08-27 NOTE — Progress Notes (Signed)
Pt given testosterone injection IM RUOQ and tolerated well. 

## 2015-08-31 ENCOUNTER — Telehealth: Payer: Self-pay | Admitting: Family Medicine

## 2015-08-31 NOTE — Telephone Encounter (Signed)
Pt aware he needs to be seen. Apt made for tomorrow at 12:10 with [facility]

## 2015-08-31 NOTE — Telephone Encounter (Signed)
In order to consider this patient request, the patient will need to be seen since these are controlled.

## 2015-09-01 ENCOUNTER — Encounter: Payer: Self-pay | Admitting: Family Medicine

## 2015-09-01 ENCOUNTER — Ambulatory Visit (INDEPENDENT_AMBULATORY_CARE_PROVIDER_SITE_OTHER): Payer: Federal, State, Local not specified - PPO | Admitting: Family Medicine

## 2015-09-01 VITALS — BP 102/67 | HR 70 | Temp 97.7°F | Ht 69.0 in | Wt 224.4 lb

## 2015-09-01 DIAGNOSIS — M4806 Spinal stenosis, lumbar region: Secondary | ICD-10-CM

## 2015-09-01 DIAGNOSIS — M542 Cervicalgia: Secondary | ICD-10-CM

## 2015-09-01 DIAGNOSIS — M48061 Spinal stenosis, lumbar region without neurogenic claudication: Secondary | ICD-10-CM

## 2015-09-01 MED ORDER — BUPRENORPHINE 10 MCG/HR TD PTWK: 10.0000 ug | MEDICATED_PATCH | TRANSDERMAL | Status: DC

## 2015-09-01 NOTE — Progress Notes (Signed)
Subjective:  Patient ID: Cesar Leon, male    DOB: 09/11/1960  Age: 55 y.o. MRN: [id number]  CC: Medication Refill   HPI Cesar Leon presents for 4/10 ache at neck. Feels stiff. Patch reduces lower back to 1/10  History Ryver has a past medical history of Hypothyroidism; Hypogonadism male; Allergic rhinitis; Chronic pain; Hyperlipidemia; GERD (gastroesophageal reflux disease); DDD (degenerative disc disease) (12/28/08); Hematuria; Femur fracture, right ([facility]); Internal hemorrhoid; History of GI bleed; and PVD (peripheral vascular disease) ([facility]).   He has past surgical history that includes Carpal tunnel release (8/11) and right ankle  (1976).   His family history includes Anxiety disorder in his father; Depression in his father; Diabetes in his brother and father; Lung cancer in his father; Peripheral vascular disease in his father.He reports that he has quit smoking. He does not have any smokeless tobacco history on file. He reports that he drinks alcohol. He reports that he does not use illicit drugs.    ROS Review of Systems  Constitutional: Negative for fever, chills and diaphoresis.  HENT: Negative for rhinorrhea and sore throat.   Respiratory: Negative for cough and shortness of breath.   Cardiovascular: Negative for chest pain.  Gastrointestinal: Negative for nausea, vomiting, abdominal pain, diarrhea and constipation.  Musculoskeletal: Negative for myalgias and arthralgias.  Skin: Negative for rash.  Neurological: Negative for weakness and headaches.    Objective:  BP 102/67 mmHg  Pulse 70  Temp(Src) 97.7 F (36.5 C) (Oral)  Ht  (1.753 m)  Wt 224 lb 6 oz (101.776 kg)  BMI 33.12 kg/m2  BP Readings from Last 3 Encounters:  09/01/15 102/67  08/05/15 107/68  04/29/15 120/87    Wt Readings from Last 3 Encounters:  09/01/15 224 lb 6 oz (101.776 kg)  08/05/15 217 lb 9.6 oz (98.703 kg)  04/29/15 202 lb 3.2 oz (91.717 kg)     Physical Exam    Constitutional: He appears well-developed and well-nourished.  HENT:  Head: Normocephalic and atraumatic.  Right Ear: Tympanic membrane and external ear normal. No decreased hearing is noted.  Left Ear: Tympanic membrane and external ear normal. No decreased hearing is noted.  Mouth/Throat: No oropharyngeal exudate or posterior oropharyngeal erythema.  Eyes: Pupils are equal, round, and reactive to light.  Neck: Normal range of motion. Neck supple.  Cardiovascular: Normal rate and regular rhythm.   No murmur heard. Pulmonary/Chest: Breath sounds normal. No respiratory distress.  Abdominal: Soft. Bowel sounds are normal. He exhibits no mass. There is no tenderness.  Vitals reviewed.    Lab Results  Component Value Date   WBC 5.3 08/12/2015   HGB 15.5 09/22/2014   HCT 46.1 08/12/2015   PLT 231 08/12/2015   GLUCOSE 115* 08/12/2015   CHOL 132 08/12/2015   TRIG 97 08/12/2015   HDL 46 08/12/2015   LDLCALC 67 08/12/2015   ALT 19 08/12/2015   AST 19 08/12/2015   NA 141 08/12/2015   K 3.9 08/12/2015   CL 99 08/12/2015   CREATININE 0.97 08/12/2015   BUN 17 08/12/2015   CO2 26 08/12/2015   TSH 2.110 01/17/2014   PSA 0.4 09/22/2014    [name]  08/09/2007  Clinical Data: Decreased testosterone level.  MRI BRAIN AND SELLA WITHOUT AND WITH CONTRAST: Technique: Multiplanar and multiecho pulse sequences of the brain and surrounding structures were obtained according to standard protocol before and after administration of intravenous contrast. Contrast: 14 cc Magnevist. Findings: The brain itself has  a normal appearance on all pulse sequences without evidence of atrophy, stroke, mass, hemorrhage, hydrocephalus, or extra-axial collection.  The pituitary gland is within normal limits in size measuring 4.4 cm cephalocaudal and 8.0 cm front to back. The enhancement pattern is normal. No evidence of adenoma. No abnormal brain enhancement occurs.  IMPRESSION: Normal examination. No  abnormality of the hypothalamus or pituitary identified.  Provider: Lyman SpellerEllen Hart   Assessment & Plan:   Casimiro NeedleMichael was seen today for medication refill.  Diagnoses and all orders for this visit:  Cervicalgia  Spinal stenosis of lumbar region  Other orders -     buprenorphine (BUTRANS) 10 MCG/HR PTWK patch; Place 1 patch (10 mcg total) onto the skin once a week.     I am having Mr. Mitzie NaBigelow maintain his Calcium Carb-Cholecalciferol (CALCIUM + D3 PO), Methylcellulose (Laxative), Multiple Vitamins-Minerals (CENTRUM SILVER PO), cetirizine, clobetasol cream, montelukast, fluticasone, polyethylene glycol powder, atorvastatin, traMADol, omeprazole, meloxicam, testosterone cypionate, and buprenorphine. We will continue to administer testosterone cypionate.  Meds ordered this encounter  Medications  . buprenorphine (BUTRANS) 10 MCG/HR PTWK patch    Sig: Place 1 patch (10 mcg total) onto the skin once a week.    Dispense:  4 patch    Refill:  0     Follow-up: Return in about 4 weeks (around 09/29/2015) for needs 30 min for initial pa4/10 at neck, 1/10 a back. in visit.  Mechele ClaudeWarren Colin Norment, M.D.

## 2015-09-02 ENCOUNTER — Other Ambulatory Visit: Payer: Self-pay | Admitting: *Deleted

## 2015-09-02 DIAGNOSIS — E291 Testicular hypofunction: Secondary | ICD-10-CM

## 2015-09-02 MED ORDER — TESTOSTERONE CYPIONATE 200 MG/ML IM SOLN
200.0000 mg | INTRAMUSCULAR | Status: DC
  Administered 2015-09-18 – 2016-10-11 (×26): 200 mg via INTRAMUSCULAR

## 2015-09-07 ENCOUNTER — Ambulatory Visit: Payer: Federal, State, Local not specified - PPO | Admitting: Family Medicine

## 2015-09-16 ENCOUNTER — Other Ambulatory Visit: Payer: Self-pay | Admitting: Family Medicine

## 2015-09-18 ENCOUNTER — Ambulatory Visit (INDEPENDENT_AMBULATORY_CARE_PROVIDER_SITE_OTHER): Payer: Federal, State, Local not specified - PPO | Admitting: *Deleted

## 2015-09-18 DIAGNOSIS — E349 Endocrine disorder, unspecified: Secondary | ICD-10-CM

## 2015-09-18 DIAGNOSIS — E291 Testicular hypofunction: Secondary | ICD-10-CM | POA: Diagnosis not present

## 2015-09-18 NOTE — Progress Notes (Signed)
Pt given testosterone injection IM LUOQ and tolerated well. °

## 2015-09-28 ENCOUNTER — Encounter: Payer: Self-pay | Admitting: Family Medicine

## 2015-09-28 ENCOUNTER — Ambulatory Visit (INDEPENDENT_AMBULATORY_CARE_PROVIDER_SITE_OTHER): Payer: Federal, State, Local not specified - PPO | Admitting: Family Medicine

## 2015-09-28 VITALS — BP 114/74 | HR 77 | Temp 97.6°F | Ht 69.0 in | Wt 226.0 lb

## 2015-09-28 DIAGNOSIS — M48061 Spinal stenosis, lumbar region without neurogenic claudication: Secondary | ICD-10-CM

## 2015-09-28 DIAGNOSIS — R7301 Impaired fasting glucose: Secondary | ICD-10-CM | POA: Diagnosis not present

## 2015-09-28 DIAGNOSIS — E785 Hyperlipidemia, unspecified: Secondary | ICD-10-CM

## 2015-09-28 DIAGNOSIS — F1120 Opioid dependence, uncomplicated: Secondary | ICD-10-CM | POA: Insufficient documentation

## 2015-09-28 DIAGNOSIS — E291 Testicular hypofunction: Secondary | ICD-10-CM

## 2015-09-28 DIAGNOSIS — K219 Gastro-esophageal reflux disease without esophagitis: Secondary | ICD-10-CM | POA: Diagnosis not present

## 2015-09-28 DIAGNOSIS — M4806 Spinal stenosis, lumbar region: Secondary | ICD-10-CM | POA: Diagnosis not present

## 2015-09-28 DIAGNOSIS — Z79899 Other long term (current) drug therapy: Secondary | ICD-10-CM | POA: Diagnosis not present

## 2015-09-28 DIAGNOSIS — Z0289 Encounter for other administrative examinations: Secondary | ICD-10-CM

## 2015-09-28 HISTORY — DX: Opioid dependence, uncomplicated: F11.20

## 2015-09-28 LAB — BAYER DCA HB A1C WAIVED: HB A1C (BAYER DCA - WAIVED): 5.6 % (ref <7.0)

## 2015-09-28 MED ORDER — BUPRENORPHINE 10 MCG/HR TD PTWK: 10.0000 ug | MEDICATED_PATCH | TRANSDERMAL | Status: DC

## 2015-09-28 NOTE — Progress Notes (Signed)
Subjective:  Patient ID: Cesar Leon, male    DOB: September 08, 1960  Age: 55 y.o. MRN: [id number]  CC: Hyperlipidemia; Hypogonadism; and Pain   HPI Cesar AldoMichael D Josey presents for Patient in for follow-up of elevated cholesterol. Doing well without complaints on current medication. Denies side effects of statin including myalgia and arthralgia and nausea. Also in today for liver function testing. Currently no chest pain, shortness of breath or other cardiovascular related symptoms noted.  Pain assessment: Cause of pain- lumbar disc dx Pain location- midline lumbar , radiation RLE post thigh Pain on scale of 1-10- 6-7, decreased by patch to 3/10 Frequency-constant Any change in general medical condition-none  Current medications- buprenophine patch Effectiveness of current meds-excellent Adverse reactions form pain meds-denied  Pill count performed-No Urine drug screen- Yes  Due for testosterone level. Needs peak, but next shot on Fri and cannot come in for level on Sat. So will delay level until next shot after(approx 3 weeks.)   History Casimiro NeedleMichael has a past medical history of Hypothyroidism; Hypogonadism male; Allergic rhinitis; Chronic pain; Hyperlipidemia; GERD (gastroesophageal reflux disease); DDD (degenerative disc disease) (12/28/08); Hematuria; Femur fracture, right (HCC); Internal hemorrhoid; History of GI bleed; and PVD (peripheral vascular disease) (HCC).   He has past surgical history that includes Carpal tunnel release (8/11) and right ankle  (1976).   His family history includes Anxiety disorder in his father; Depression in his father; Diabetes in his brother and father; Lung cancer in his father; Peripheral vascular disease in his father.He reports that he has quit smoking. He does not have any smokeless tobacco history on file. He reports that he drinks alcohol. He reports that he does not use illicit drugs.  Current Outpatient Prescriptions on File Prior to Visit    Medication Sig Dispense Refill  . atorvastatin (LIPITOR) 40 MG tablet TAKE ONE (1) TABLET EACH DAY 90 tablet 1  . Calcium Carb-Cholecalciferol (CALCIUM + D3 PO) Take by mouth.    . cetirizine (ZYRTEC) 10 MG tablet Take 1 tablet (10 mg total) by mouth daily. 90 tablet 1  . clobetasol cream (TEMOVATE) 0.05 % Apply 1 application topically 2 (two) times daily. 30 g 2  . fluticasone (FLONASE) 50 MCG/ACT nasal spray USE 2 SPRAYS IN EACH NOSTRIL AT BEDTIME 16 g 5  . meloxicam (MOBIC) 15 MG tablet TAKE ONE (1) TABLET EACH DAY 30 tablet 1  . Methylcellulose, Laxative, 500 MG TABS Take 2 tablets by mouth daily.    . montelukast (SINGULAIR) 10 MG tablet TAKE ONE TABLET DAILY AT BEDTIME 90 tablet 1  . Multiple Vitamins-Minerals (CENTRUM SILVER PO) Take 1 tablet by mouth daily.    Marland Kitchen. omeprazole (PRILOSEC) 40 MG capsule TAKE ONE (1) CAPSULE EACH DAY 90 capsule 1  . polyethylene glycol powder (GLYCOLAX/MIRALAX) powder MIX 17 GRAMS INTO 8OZ OF WATER AND DRINK DAILY 527 g 5  . testosterone cypionate (DEPOTESTOSTERONE CYPIONATE) 200 MG/ML injection Inject 1 mL (200 mg total) into the muscle every 14 (fourteen) days. 10 mL 2  . traMADol (ULTRAM) 50 MG tablet TAKE 1 TAB 5 TIMES DAILY FOR 1 WEEK THENTAKE 1 TAB 4 TIMES DAILY FOR 1 WEEK THENTAKE 1 TAB 3 TIMES DAILY FOR 1 WEEK THENTAKE 1 TWICE DAILY X 105 tablet 0   Current Facility-Administered Medications on File Prior to Visit  Medication Dose Route Frequency Provider Last Rate Last Dose  . testosterone cypionate (DEPOTESTOSTERONE CYPIONATE) injection 200 mg  200 mg Intramuscular Q14 Days Mechele ClaudeWarren Breiona Couvillon, MD   200 mg  at 09/18/15 1035     ROS Review of Systems  Constitutional: Negative for fever, chills, diaphoresis and unexpected weight change.  HENT: Negative for congestion, hearing loss, rhinorrhea and sore throat.   Eyes: Negative for visual disturbance.  Respiratory: Negative for cough and shortness of breath.   Cardiovascular: Negative for chest pain.   Gastrointestinal: Negative for abdominal pain, diarrhea and constipation.  Genitourinary: Negative for dysuria and flank pain.  Musculoskeletal: Negative for joint swelling and arthralgias.  Skin: Negative for rash.  Neurological: Negative for dizziness and headaches.  Psychiatric/Behavioral: Negative for sleep disturbance and dysphoric mood.    Objective:  BP 114/74 mmHg  Pulse 77  Temp(Src) 97.6 F (36.4 C) (Oral)  Ht  (1.753 m)  Wt 226 lb (102.513 kg)  BMI 33.36 kg/m2  SpO2 96%  BP Readings from Last 3 Encounters:  09/28/15 114/74  09/01/15 102/67  08/05/15 107/68    Wt Readings from Last 3 Encounters:  09/28/15 226 lb (102.513 kg)  09/01/15 224 lb 6 oz (101.776 kg)  08/05/15 217 lb 9.6 oz (98.703 kg)     Physical Exam  Constitutional: He is oriented to person, place, and time. He appears well-developed and well-nourished. No distress.  HENT:  Head: Normocephalic and atraumatic.  Right Ear: External ear normal.  Left Ear: External ear normal.  Nose: Nose normal.  Mouth/Throat: Oropharynx is clear and moist.  Eyes: Conjunctivae and EOM are normal. Pupils are equal, round, and reactive to light.  Neck: Normal range of motion. Neck supple. No thyromegaly present.  Cardiovascular: Normal rate, regular rhythm and normal heart sounds.   No murmur heard. Pulmonary/Chest: Effort normal and breath sounds normal. No respiratory distress. He has no wheezes. He has no rales.  Abdominal: Soft. Bowel sounds are normal. He exhibits no distension. There is no tenderness.  Lymphadenopathy:    He has no cervical adenopathy.  Neurological: He is alert and oriented to person, place, and time. He has normal reflexes.  Skin: Skin is warm and dry.  Psychiatric: He has a normal mood and affect. His behavior is normal. Judgment and thought content normal.     Lab Results  Component Value Date   WBC 5.3 08/12/2015   HGB 15.5 09/22/2014   HCT 46.1 08/12/2015   PLT 231  08/12/2015   GLUCOSE 115* 08/12/2015   CHOL 132 08/12/2015   TRIG 97 08/12/2015   HDL 46 08/12/2015   LDLCALC 67 08/12/2015   ALT 19 08/12/2015   AST 19 08/12/2015   NA 141 08/12/2015   K 3.9 08/12/2015   CL 99 08/12/2015   CREATININE 0.97 08/12/2015   BUN 17 08/12/2015   CO2 26 08/12/2015   TSH 2.110 01/17/2014   PSA 0.4 09/22/2014    [name]  08/09/2007  Clinical Data: Decreased testosterone level.  MRI BRAIN AND SELLA WITHOUT AND WITH CONTRAST: Technique: Multiplanar and multiecho pulse sequences of the brain and surrounding structures were obtained according to standard protocol before and after administration of intravenous contrast. Contrast: 14 cc Magnevist. Findings: The brain itself has a normal appearance on all pulse sequences without evidence of atrophy, stroke, mass, hemorrhage, hydrocephalus, or extra-axial collection.  The pituitary gland is within normal limits in size measuring 4.4 cm cephalocaudal and 8.0 cm front to back. The enhancement pattern is normal. No evidence of adenoma. No abnormal brain enhancement occurs.  IMPRESSION: Normal examination. No abnormality of the hypothalamus or pituitary identified.  Provider: Lyman Speller  Assessment & Plan:   Cobain was seen today for hyperlipidemia, hypogonadism and pain.  Diagnoses and all orders for this visit:  Gastroesophageal reflux disease without esophagitis  Hyperlipemia  Hypogonadism male -     Testosterone,Free and Total; Standing -     Cancel: Testosterone,Free and Total  Spinal stenosis of lumbar region -     ToxASSURE Select 13 (MW), Urine  Pain medication agreement completed -     ToxASSURE Select 13 (MW), Urine  Opioid type dependence, continuous (HCC) -     ToxASSURE Select 13 (MW), Urine  Elevated fasting glucose -     Bayer DCA Hb A1c Waived  Other orders -     buprenorphine (BUTRANS) 10 MCG/HR PTWK patch; Place 1 patch (10 mcg total) onto the skin once a  week.    Pain agreement reviewed  I am having Mr. Frontera maintain his Calcium Carb-Cholecalciferol (CALCIUM + D3 PO), Methylcellulose (Laxative), Multiple Vitamins-Minerals (CENTRUM SILVER PO), cetirizine, clobetasol cream, montelukast, fluticasone, polyethylene glycol powder, atorvastatin, traMADol, omeprazole, testosterone cypionate, meloxicam, and buprenorphine. We will continue to administer testosterone cypionate.  Meds ordered this encounter  Medications  . buprenorphine (BUTRANS) 10 MCG/HR PTWK patch    Sig: Place 1 patch (10 mcg total) onto the skin once a week.    Dispense:  4 patch    Refill:  2     Follow-up: Return in about 3 months (around 12/28/2015) for Pain.  Mechele Claude, M.D.

## 2015-09-29 LAB — TESTOSTERONE,FREE AND TOTAL
Testosterone, Free: 10.9 pg/mL (ref 7.2–24.0)
Testosterone: 532 ng/dL (ref 348–1197)

## 2015-09-30 ENCOUNTER — Telehealth: Payer: Self-pay | Admitting: Family Medicine

## 2015-09-30 NOTE — Telephone Encounter (Signed)
See if you can do this. My NPI is [id number]

## 2015-09-30 NOTE — Telephone Encounter (Signed)
Pt notified Savings card has been activated and [facility] [place] Copy to front for pt pick up

## 2015-10-01 LAB — TOXASSURE SELECT 13 (MW), URINE: PDF: 0

## 2015-10-02 ENCOUNTER — Ambulatory Visit (INDEPENDENT_AMBULATORY_CARE_PROVIDER_SITE_OTHER): Payer: Federal, State, Local not specified - PPO | Admitting: *Deleted

## 2015-10-02 DIAGNOSIS — E291 Testicular hypofunction: Secondary | ICD-10-CM

## 2015-10-02 NOTE — Patient Instructions (Signed)

## 2015-10-02 NOTE — Progress Notes (Signed)
Testosterone injection given and patient tolerated well.  

## 2015-10-07 ENCOUNTER — Encounter: Payer: Self-pay | Admitting: Nurse Practitioner

## 2015-10-07 ENCOUNTER — Ambulatory Visit (INDEPENDENT_AMBULATORY_CARE_PROVIDER_SITE_OTHER): Payer: Federal, State, Local not specified - PPO

## 2015-10-07 ENCOUNTER — Ambulatory Visit (INDEPENDENT_AMBULATORY_CARE_PROVIDER_SITE_OTHER): Payer: Federal, State, Local not specified - PPO | Admitting: Nurse Practitioner

## 2015-10-07 VITALS — BP 112/74 | HR 72 | Temp 97.5°F | Ht 69.0 in | Wt 223.0 lb

## 2015-10-07 DIAGNOSIS — M25562 Pain in left knee: Secondary | ICD-10-CM

## 2015-10-07 MED ORDER — METHYLPREDNISOLONE ACETATE 80 MG/ML IJ SUSP
80.0000 mg | Freq: Once | INTRAMUSCULAR | Status: AC
  Administered 2015-10-07: 80 mg via INTRAMUSCULAR

## 2015-10-07 MED ORDER — PREDNISONE 20 MG PO TABS: ORAL_TABLET | ORAL | Status: DC

## 2015-10-07 NOTE — Progress Notes (Addendum)
   Subjective:    Patient ID: Cesar Leon, male    DOB: [birth year], 55 y.o.   MRN: [id number]  HPI Patient in c/o left knee pain- he was walking about a week ago and left knee popped and it has bothered him every since. Rates pain currently sitting 0/10 but if he puts weight on it is a 8-9/10. Has not taken any meds- He is on Butrans patch for chronic back pain.    Review of Systems  Constitutional: Negative.   HENT: Negative.   Respiratory: Negative.   Cardiovascular: Negative.   Genitourinary: Negative.   Musculoskeletal: Positive for arthralgias (left knee).  Neurological: Negative.   Psychiatric/Behavioral: Negative.   All other systems reviewed and are negative.      Objective:   Physical Exam  Constitutional: He is oriented to person, place, and time. He appears well-developed and well-nourished. No distress.  Cardiovascular: Normal rate, regular rhythm and normal heart sounds.   Pulmonary/Chest: Effort normal and breath sounds normal.  Musculoskeletal:  Mild left knee effusion No patella tenderness Crepitus on flexion and extension  Neurological: He is alert and oriented to person, place, and time.  Skin: Skin is warm.  Psychiatric: He has a normal mood and affect. His behavior is normal. Judgment and thought content normal.    BP 112/74 mmHg  Pulse 72  Temp(Src) 97.5 F (36.4 C) (Oral)  Ht 5\' 9"  (1.753 m)  Wt 223 lb (101.152 kg)  BMI 32.92 kg/m2       Assessment & Plan:   1. Left knee pain    Meds ordered this encounter  Medications  . methylPREDNISolone acetate (DEPO-MEDROL) injection 80 mg    Sig:   . predniSONE (DELTASONE) 20 MG tablet    Sig: 2 po at sametime daily for 5 days- start tomorrow    Dispense:  10 tablet    Refill:  0    Order Specific Question:  Supervising Provider    Answer:  Ernestina PennaMOORE, DONALD W [[id number]]   Rest Ice Compression elevate leg when sitting RTO prn  Cesar Daphine DeutscherMartin, FNP

## 2015-10-07 NOTE — Patient Instructions (Signed)

## 2015-10-07 NOTE — Addendum Note (Signed)
Addended by: Almeta MonasSTONE, JANIE M on: 10/07/2015 05:43 PM   Modules accepted: Kipp BroodSmartSet

## 2015-10-15 NOTE — Addendum Note (Signed)
Addended by: Bennie PieriniMARTIN, MARY-MARGARET on: 10/15/2015 03:23 PM   Modules accepted: Kipp BroodSmartSet

## 2015-10-19 ENCOUNTER — Ambulatory Visit (INDEPENDENT_AMBULATORY_CARE_PROVIDER_SITE_OTHER): Payer: Federal, State, Local not specified - PPO | Admitting: *Deleted

## 2015-10-19 DIAGNOSIS — E291 Testicular hypofunction: Secondary | ICD-10-CM

## 2015-10-19 NOTE — Progress Notes (Signed)
Testosterone injection given and patient tolerated well.  

## 2015-10-19 NOTE — Patient Instructions (Signed)

## 2015-10-20 ENCOUNTER — Telehealth: Payer: Self-pay | Admitting: Nurse Practitioner

## 2015-10-20 ENCOUNTER — Other Ambulatory Visit: Payer: Self-pay | Admitting: Nurse Practitioner

## 2015-10-20 DIAGNOSIS — M25569 Pain in unspecified knee: Secondary | ICD-10-CM

## 2015-10-20 NOTE — Telephone Encounter (Signed)
Referral made to ortho

## 2015-10-20 NOTE — Telephone Encounter (Signed)
Pt is aware.  

## 2015-10-20 NOTE — Telephone Encounter (Signed)
Spoke to patient and he states once he went back to work the knee started bother him again. Did you want him to see a specialist? Ortho? Please advise.

## 2015-10-30 ENCOUNTER — Ambulatory Visit: Payer: Federal, State, Local not specified - PPO

## 2015-11-02 ENCOUNTER — Ambulatory Visit (INDEPENDENT_AMBULATORY_CARE_PROVIDER_SITE_OTHER): Payer: Federal, State, Local not specified - PPO | Admitting: *Deleted

## 2015-11-02 DIAGNOSIS — E291 Testicular hypofunction: Secondary | ICD-10-CM | POA: Diagnosis not present

## 2015-11-02 NOTE — Patient Instructions (Signed)

## 2015-11-02 NOTE — Progress Notes (Signed)
Pt given 200mg  testosterone injection IM RUOQ and tolerated well.

## 2015-11-03 DIAGNOSIS — S83242A Other tear of medial meniscus, current injury, left knee, initial encounter: Secondary | ICD-10-CM | POA: Diagnosis not present

## 2015-11-10 DIAGNOSIS — K080 Exfoliation of teeth due to systemic causes: Secondary | ICD-10-CM | POA: Diagnosis not present

## 2015-11-14 DIAGNOSIS — M25562 Pain in left knee: Secondary | ICD-10-CM | POA: Diagnosis not present

## 2015-11-17 ENCOUNTER — Ambulatory Visit (INDEPENDENT_AMBULATORY_CARE_PROVIDER_SITE_OTHER): Payer: Federal, State, Local not specified - PPO

## 2015-11-17 DIAGNOSIS — E291 Testicular hypofunction: Secondary | ICD-10-CM | POA: Diagnosis not present

## 2015-11-17 NOTE — Patient Instructions (Signed)

## 2015-11-17 NOTE — Progress Notes (Signed)
Patient given 200 mg of testosterone cypoinate and tolerated well

## 2015-11-19 ENCOUNTER — Other Ambulatory Visit: Payer: Self-pay | Admitting: Family Medicine

## 2015-11-19 DIAGNOSIS — S83242A Other tear of medial meniscus, current injury, left knee, initial encounter: Secondary | ICD-10-CM | POA: Diagnosis not present

## 2015-11-19 DIAGNOSIS — M958 Other specified acquired deformities of musculoskeletal system: Secondary | ICD-10-CM | POA: Diagnosis not present

## 2015-11-19 DIAGNOSIS — S83282D Other tear of lateral meniscus, current injury, left knee, subsequent encounter: Secondary | ICD-10-CM | POA: Diagnosis not present

## 2015-11-27 ENCOUNTER — Other Ambulatory Visit: Payer: Self-pay | Admitting: Family Medicine

## 2015-11-30 NOTE — Telephone Encounter (Signed)
Last seen 10/07/15 MMM  If approved print

## 2015-12-18 ENCOUNTER — Other Ambulatory Visit: Payer: Self-pay | Admitting: Family Medicine

## 2015-12-21 ENCOUNTER — Ambulatory Visit (INDEPENDENT_AMBULATORY_CARE_PROVIDER_SITE_OTHER): Payer: Federal, State, Local not specified - PPO | Admitting: *Deleted

## 2015-12-21 DIAGNOSIS — E291 Testicular hypofunction: Secondary | ICD-10-CM

## 2015-12-21 DIAGNOSIS — E349 Endocrine disorder, unspecified: Secondary | ICD-10-CM

## 2015-12-21 NOTE — Progress Notes (Signed)
Testosterone inj given Pt tolerated well 

## 2015-12-25 DIAGNOSIS — S83282D Other tear of lateral meniscus, current injury, left knee, subsequent encounter: Secondary | ICD-10-CM | POA: Diagnosis not present

## 2015-12-25 DIAGNOSIS — S83242D Other tear of medial meniscus, current injury, left knee, subsequent encounter: Secondary | ICD-10-CM | POA: Diagnosis not present

## 2015-12-28 ENCOUNTER — Other Ambulatory Visit: Payer: Self-pay | Admitting: Family Medicine

## 2015-12-28 MED ORDER — BUPRENORPHINE 10 MCG/HR TD PTWK: MEDICATED_PATCH | TRANSDERMAL | Status: DC

## 2015-12-28 NOTE — Telephone Encounter (Signed)
Last filled 11/30/15

## 2015-12-28 NOTE — Telephone Encounter (Signed)
Rx up front. Patient notified 

## 2016-01-04 ENCOUNTER — Encounter: Payer: Self-pay | Admitting: Family Medicine

## 2016-01-04 ENCOUNTER — Ambulatory Visit (INDEPENDENT_AMBULATORY_CARE_PROVIDER_SITE_OTHER): Payer: Federal, State, Local not specified - PPO | Admitting: Family Medicine

## 2016-01-04 VITALS — BP 114/76 | HR 70 | Temp 97.4°F | Ht 69.0 in | Wt 232.8 lb

## 2016-01-04 DIAGNOSIS — Z0289 Encounter for other administrative examinations: Secondary | ICD-10-CM

## 2016-01-04 DIAGNOSIS — Z79899 Other long term (current) drug therapy: Secondary | ICD-10-CM

## 2016-01-04 DIAGNOSIS — R7301 Impaired fasting glucose: Secondary | ICD-10-CM | POA: Diagnosis not present

## 2016-01-04 DIAGNOSIS — E785 Hyperlipidemia, unspecified: Secondary | ICD-10-CM | POA: Diagnosis not present

## 2016-01-04 DIAGNOSIS — F1120 Opioid dependence, uncomplicated: Secondary | ICD-10-CM | POA: Diagnosis not present

## 2016-01-04 DIAGNOSIS — E291 Testicular hypofunction: Secondary | ICD-10-CM

## 2016-01-04 MED ORDER — BUPRENORPHINE 10 MCG/HR TD PTWK: MEDICATED_PATCH | TRANSDERMAL | Status: DC

## 2016-01-04 NOTE — Progress Notes (Addendum)
Subjective:  Patient ID: Cesar Leon, male    DOB: Jan 16, 1961  Age: 55 y.o. MRN: [id number]  CC: Back Pain   HPI Cesar Leon presents for Reassessment of his ongoing low back pain. [place] controlled substance report reviewed showing appropriate filling of his Butrans athrough this office only Pain assessment: Cause of pain- spinal stenosis   Pain location- lower back Pain on scale of 1-10- 1 Frequency- daily   What increases pain-frequent bending  What makes peain Better-rest Effects on ADL - none as long as using med Any change in general medical condition-non  Current medications-  butrans patch Effectiveness of current meds- excellent Adverse reactions form pain meds-denied  Pill count performed-No Urine drug screen- Yes Patient in for follow-up of elevated cholesterol. Doing well without complaints on current medication. Denies side effects of statin including myalgia and arthralgia and nausea. Also in today for liver function testing. Currently no chest pain, shortness of breath or other cardiovascular related symptoms noted.  Follow up for testosterone deficiency: Pt. Using medication as directed.Last injection was 12/21/2015. Denies any sx referrable to DVT such as edema erythema of legs. No dyspnea or chest pain. Energy level reported as being good. Libido is normal and denies E.D. Feels strength and energy are adequate and improved from baseline. History Cesar Leon has a past medical history of Hypothyroidism; Hypogonadism male; Allergic rhinitis; Chronic pain; Hyperlipidemia; GERD (gastroesophageal reflux disease); DDD (degenerative disc disease) (12/28/08); Hematuria; Femur fracture, right ([place]); Internal hemorrhoid; History of GI bleed; and PVD (peripheral vascular disease) ([facility]).   He has past surgical history that includes Carpal tunnel release (8/11) and right ankle  (1976).   His family history includes Anxiety disorder in his father; Depression in his  father; Diabetes in his brother and father; Lung cancer in his father; Peripheral vascular disease in his father.He reports that he has quit smoking. He does not have any smokeless tobacco history on file. He reports that he drinks alcohol. He reports that he does not use illicit drugs.    ROS Review of Systems  Constitutional: Negative for fever, chills, diaphoresis and unexpected weight change.  HENT: Negative for congestion, hearing loss, rhinorrhea and sore throat.   Eyes: Negative for visual disturbance.  Respiratory: Negative for cough and shortness of breath.   Cardiovascular: Negative for chest pain.  Gastrointestinal: Negative for abdominal pain, diarrhea and constipation.  Genitourinary: Negative for dysuria and flank pain.  Musculoskeletal: Negative for joint swelling and arthralgias.  Skin: Negative for rash.  Neurological: Negative for dizziness and headaches.  Psychiatric/Behavioral: Negative for sleep disturbance and dysphoric mood.    Objective:  BP 114/76 mmHg  Pulse 70  Temp(Src) 97.4 F (36.3 C) (Oral)  Ht '5\' 9"'$  (1.753 m)  Wt 232 lb 12.8 oz (105.597 kg)  BMI 34.36 kg/m2  SpO2 96%  BP Readings from Last 3 Encounters:  01/04/16 114/76  10/07/15 112/74  09/28/15 114/74    Wt Readings from Last 3 Encounters:  01/04/16 232 lb 12.8 oz (105.597 kg)  10/07/15 223 lb (101.152 kg)  09/28/15 226 lb (102.513 kg)     Physical Exam  Constitutional: He is oriented to person, place, and time. He appears well-developed and well-nourished. No distress.  HENT:  Head: Normocephalic and atraumatic.  Right Ear: External ear normal.  Left Ear: External ear normal.  Nose: Nose normal.  Mouth/Throat: Oropharynx is clear and moist.  Eyes: Conjunctivae and EOM are normal. Pupils are equal, round, and reactive to light.  Neck: Normal range of motion. Neck supple. No thyromegaly present.  Cardiovascular: Normal rate, regular rhythm and normal heart sounds.   No murmur  heard. Pulmonary/Chest: Effort normal and breath sounds normal. No respiratory distress. He has no wheezes. He has no rales.  Abdominal: Soft. Bowel sounds are normal. He exhibits no distension. There is no tenderness.  Lymphadenopathy:    He has no cervical adenopathy.  Neurological: He is alert and oriented to person, place, and time. He has normal reflexes.  Skin: Skin is warm and dry.  Psychiatric: He has a normal mood and affect. His behavior is normal. Judgment and thought content normal.     Lab Results  Component Value Date   WBC 5.3 08/12/2015   HGB 15.5 09/22/2014   HCT 46.1 08/12/2015   PLT 231 08/12/2015   GLUCOSE 115* 08/12/2015   CHOL 132 08/12/2015   TRIG 97 08/12/2015   HDL 46 08/12/2015   LDLCALC 67 08/12/2015   ALT 19 08/12/2015   AST 19 08/12/2015   NA 141 08/12/2015   K 3.9 08/12/2015   CL 99 08/12/2015   CREATININE 0.97 08/12/2015   BUN 17 08/12/2015   CO2 26 08/12/2015   TSH 2.110 01/17/2014   PSA 0.4 09/22/2014    [name]  08/09/2007  Clinical Data: Decreased testosterone level.  MRI BRAIN AND SELLA WITHOUT AND WITH CONTRAST: Technique: Multiplanar and multiecho pulse sequences of the brain and surrounding structures were obtained according to standard protocol before and after administration of intravenous contrast. Contrast: 14 cc Magnevist. Findings: The brain itself has a normal appearance on all pulse sequences without evidence of atrophy, stroke, mass, hemorrhage, hydrocephalus, or extra-axial collection.  The pituitary gland is within normal limits in size measuring 4.4 cm cephalocaudal and 8.0 cm front to back. The enhancement pattern is normal. No evidence of adenoma. No abnormal brain enhancement occurs.  IMPRESSION: Normal examination. No abnormality of the hypothalamus or pituitary identified.  Provider: Lavonia Leon   Assessment & Plan:   Cesar Leon was seen today for back pain.  Diagnoses and all orders for this  visit:  Hypogonadism male -     Testosterone,Free and Total -     CBC with Differential/Platelet  Opioid type dependence, continuous (HCC) -     ToxASSURE Select 13 (MW), Urine  Elevated fasting glucose -     CMP14+EGFR  Hyperlipemia -     CMP14+EGFR  Pain medication agreement completed -     ToxASSURE Select 13 (MW), Urine  Other orders -     Discontinue: buprenorphine (BUTRANS) 10 MCG/HR PTWK patch; APPLY 1 PATCH EVERY WEEK -     buprenorphine (BUTRANS) 10 MCG/HR PTWK patch; APPLY 1 PATCH EVERY WEEK      I have discontinued Cesar Leon traMADol, predniSONE, and meloxicam. I am also having him maintain his Calcium Carb-Cholecalciferol (CALCIUM + D3 PO), Methylcellulose (Laxative), Multiple Vitamins-Minerals (CENTRUM SILVER PO), cetirizine, clobetasol cream, omeprazole, testosterone cypionate, fluticasone, atorvastatin, polyethylene glycol powder, montelukast, diclofenac, and buprenorphine. We will continue to administer testosterone cypionate.  Meds ordered this encounter  Medications  . diclofenac (VOLTAREN) 75 MG EC tablet    Sig: Take 75 mg by mouth 2 (two) times daily.  Marland Kitchen DISCONTD: buprenorphine (BUTRANS) 10 MCG/HR PTWK patch    Sig: APPLY 1 PATCH EVERY WEEK    Dispense:  4 patch    Refill:  0  . buprenorphine (BUTRANS) 10 MCG/HR PTWK patch    Sig: APPLY 1 PATCH EVERY  WEEK    Dispense:  4 patch    Refill:  2    Do not fill prior to January 27, 2016     Follow-up: Return in about 3 months (around 04/05/2016) for Pain, testosterone.  Claretta Fraise, M.D.

## 2016-01-05 ENCOUNTER — Ambulatory Visit (INDEPENDENT_AMBULATORY_CARE_PROVIDER_SITE_OTHER): Payer: Federal, State, Local not specified - PPO | Admitting: *Deleted

## 2016-01-05 DIAGNOSIS — E291 Testicular hypofunction: Secondary | ICD-10-CM | POA: Diagnosis not present

## 2016-01-05 LAB — CBC WITH DIFFERENTIAL/PLATELET
Basophils Absolute: 0 10*3/uL (ref 0.0–0.2)
Basos: 0 %
EOS (ABSOLUTE): 0.1 10*3/uL (ref 0.0–0.4)
EOS: 2 %
HEMATOCRIT: 45.1 % (ref 37.5–51.0)
HEMOGLOBIN: 14.9 g/dL (ref 12.6–17.7)
IMMATURE GRANS (ABS): 0 10*3/uL (ref 0.0–0.1)
Immature Granulocytes: 0 %
LYMPHS ABS: 1.6 10*3/uL (ref 0.7–3.1)
Lymphs: 32 %
MCH: 28.7 pg (ref 26.6–33.0)
MCHC: 33 g/dL (ref 31.5–35.7)
MCV: 87 fL (ref 79–97)
MONOCYTES: 11 %
Monocytes Absolute: 0.5 10*3/uL (ref 0.1–0.9)
NEUTROS ABS: 2.7 10*3/uL (ref 1.4–7.0)
Neutrophils: 55 %
Platelets: 228 10*3/uL (ref 150–379)
RBC: 5.19 x10E6/uL (ref 4.14–5.80)
RDW: 13.9 % (ref 12.3–15.4)
WBC: 5 10*3/uL (ref 3.4–10.8)

## 2016-01-05 LAB — CMP14+EGFR
ALBUMIN: 4 g/dL (ref 3.5–5.5)
ALK PHOS: 95 IU/L (ref 39–117)
ALT: 22 IU/L (ref 0–44)
AST: 16 IU/L (ref 0–40)
Albumin/Globulin Ratio: 1.9 (ref 1.2–2.2)
BUN / CREAT RATIO: 22 — AB (ref 9–20)
BUN: 20 mg/dL (ref 6–24)
Bilirubin Total: 0.7 mg/dL (ref 0.0–1.2)
CO2: 24 mmol/L (ref 18–29)
CREATININE: 0.93 mg/dL (ref 0.76–1.27)
Calcium: 8.8 mg/dL (ref 8.7–10.2)
Chloride: 101 mmol/L (ref 96–106)
GFR calc non Af Amer: 92 mL/min/{1.73_m2} (ref >59)
GFR, EST AFRICAN AMERICAN: 106 mL/min/{1.73_m2} (ref >59)
GLOBULIN, TOTAL: 2.1 g/dL (ref 1.5–4.5)
Glucose: 105 mg/dL — ABNORMAL HIGH (ref 65–99)
Potassium: 4.1 mmol/L (ref 3.5–5.2)
SODIUM: 140 mmol/L (ref 134–144)
TOTAL PROTEIN: 6.1 g/dL (ref 6.0–8.5)

## 2016-01-05 LAB — TESTOSTERONE,FREE AND TOTAL
Testosterone, Free: 5.8 pg/mL — ABNORMAL LOW (ref 7.2–24.0)
Testosterone: 230 ng/dL — ABNORMAL LOW (ref 348–1197)

## 2016-01-06 NOTE — Progress Notes (Signed)
Pt given Testosterone injection IM LUOQ and tolerated well. ?

## 2016-01-11 LAB — TOXASSURE SELECT 13 (MW), URINE: PDF: 0

## 2016-01-17 ENCOUNTER — Other Ambulatory Visit: Payer: Self-pay | Admitting: Family Medicine

## 2016-01-19 ENCOUNTER — Ambulatory Visit (INDEPENDENT_AMBULATORY_CARE_PROVIDER_SITE_OTHER): Payer: Federal, State, Local not specified - PPO | Admitting: *Deleted

## 2016-01-19 DIAGNOSIS — E291 Testicular hypofunction: Secondary | ICD-10-CM

## 2016-01-19 NOTE — Progress Notes (Signed)
Pt given testosterone injection IM RUOQ and tolerated well. 

## 2016-02-02 ENCOUNTER — Ambulatory Visit (INDEPENDENT_AMBULATORY_CARE_PROVIDER_SITE_OTHER): Payer: Federal, State, Local not specified - PPO

## 2016-02-02 DIAGNOSIS — E291 Testicular hypofunction: Secondary | ICD-10-CM

## 2016-02-02 NOTE — Progress Notes (Signed)
Testosterone injection given to left upper outer quadrant.  Patient tolerated well. 

## 2016-02-05 DIAGNOSIS — S83282D Other tear of lateral meniscus, current injury, left knee, subsequent encounter: Secondary | ICD-10-CM | POA: Diagnosis not present

## 2016-02-05 DIAGNOSIS — S83242D Other tear of medial meniscus, current injury, left knee, subsequent encounter: Secondary | ICD-10-CM | POA: Diagnosis not present

## 2016-02-09 DIAGNOSIS — B070 Plantar wart: Secondary | ICD-10-CM | POA: Diagnosis not present

## 2016-02-09 DIAGNOSIS — L74519 Primary focal hyperhidrosis, unspecified: Secondary | ICD-10-CM | POA: Diagnosis not present

## 2016-02-09 DIAGNOSIS — M79672 Pain in left foot: Secondary | ICD-10-CM | POA: Diagnosis not present

## 2016-02-09 DIAGNOSIS — Q828 Other specified congenital malformations of skin: Secondary | ICD-10-CM | POA: Diagnosis not present

## 2016-02-16 ENCOUNTER — Ambulatory Visit (INDEPENDENT_AMBULATORY_CARE_PROVIDER_SITE_OTHER): Payer: Federal, State, Local not specified - PPO | Admitting: *Deleted

## 2016-02-16 DIAGNOSIS — E291 Testicular hypofunction: Secondary | ICD-10-CM

## 2016-02-16 NOTE — Progress Notes (Signed)
Pt given Testosterone injection 200mg  IM RUOQ and tolerated well.

## 2016-03-01 ENCOUNTER — Ambulatory Visit (INDEPENDENT_AMBULATORY_CARE_PROVIDER_SITE_OTHER): Payer: Federal, State, Local not specified - PPO

## 2016-03-01 DIAGNOSIS — E291 Testicular hypofunction: Secondary | ICD-10-CM

## 2016-03-01 NOTE — Progress Notes (Signed)
Testosterone injection given to left upper outer quadrant, patient tolerated well  

## 2016-03-15 ENCOUNTER — Ambulatory Visit (INDEPENDENT_AMBULATORY_CARE_PROVIDER_SITE_OTHER): Payer: Federal, State, Local not specified - PPO | Admitting: *Deleted

## 2016-03-15 DIAGNOSIS — E349 Endocrine disorder, unspecified: Secondary | ICD-10-CM

## 2016-03-15 DIAGNOSIS — E291 Testicular hypofunction: Secondary | ICD-10-CM | POA: Diagnosis not present

## 2016-03-15 NOTE — Progress Notes (Signed)
Pt given Testosteron inj tolerated well

## 2016-03-17 ENCOUNTER — Other Ambulatory Visit: Payer: Self-pay | Admitting: Family Medicine

## 2016-03-18 ENCOUNTER — Other Ambulatory Visit: Payer: Self-pay

## 2016-03-29 ENCOUNTER — Ambulatory Visit (INDEPENDENT_AMBULATORY_CARE_PROVIDER_SITE_OTHER): Payer: Federal, State, Local not specified - PPO | Admitting: *Deleted

## 2016-03-29 DIAGNOSIS — E291 Testicular hypofunction: Secondary | ICD-10-CM

## 2016-03-29 DIAGNOSIS — Z23 Encounter for immunization: Secondary | ICD-10-CM

## 2016-03-29 DIAGNOSIS — E349 Endocrine disorder, unspecified: Secondary | ICD-10-CM

## 2016-03-29 NOTE — Progress Notes (Signed)
Pt given testosterone inj Tolerated well 

## 2016-04-12 ENCOUNTER — Ambulatory Visit (INDEPENDENT_AMBULATORY_CARE_PROVIDER_SITE_OTHER): Payer: Federal, State, Local not specified - PPO | Admitting: *Deleted

## 2016-04-12 DIAGNOSIS — E349 Endocrine disorder, unspecified: Secondary | ICD-10-CM

## 2016-04-12 DIAGNOSIS — E291 Testicular hypofunction: Secondary | ICD-10-CM | POA: Diagnosis not present

## 2016-04-12 NOTE — Progress Notes (Signed)
Pt given Testosterone inj Tolerated well 

## 2016-04-18 ENCOUNTER — Encounter: Payer: Self-pay | Admitting: Family Medicine

## 2016-04-18 ENCOUNTER — Ambulatory Visit (INDEPENDENT_AMBULATORY_CARE_PROVIDER_SITE_OTHER): Payer: Federal, State, Local not specified - PPO | Admitting: Family Medicine

## 2016-04-18 VITALS — BP 101/62 | HR 71 | Temp 98.4°F | Ht 69.0 in | Wt 225.0 lb

## 2016-04-18 DIAGNOSIS — E782 Mixed hyperlipidemia: Secondary | ICD-10-CM

## 2016-04-18 DIAGNOSIS — Z0289 Encounter for other administrative examinations: Secondary | ICD-10-CM | POA: Diagnosis not present

## 2016-04-18 DIAGNOSIS — F1120 Opioid dependence, uncomplicated: Secondary | ICD-10-CM

## 2016-04-18 DIAGNOSIS — E291 Testicular hypofunction: Secondary | ICD-10-CM | POA: Diagnosis not present

## 2016-04-18 DIAGNOSIS — M48061 Spinal stenosis, lumbar region without neurogenic claudication: Secondary | ICD-10-CM | POA: Diagnosis not present

## 2016-04-18 DIAGNOSIS — E349 Endocrine disorder, unspecified: Secondary | ICD-10-CM

## 2016-04-18 DIAGNOSIS — K219 Gastro-esophageal reflux disease without esophagitis: Secondary | ICD-10-CM

## 2016-04-18 DIAGNOSIS — R739 Hyperglycemia, unspecified: Secondary | ICD-10-CM

## 2016-04-18 MED ORDER — BUPRENORPHINE 10 MCG/HR TD PTWK: MEDICATED_PATCH | TRANSDERMAL | 2 refills | Status: DC

## 2016-04-18 NOTE — Progress Notes (Signed)
Subjective:  Patient ID: Cesar Leon, male    DOB: 24-Jan-1961  Age: 55 y.o. MRN: [id number]  CC: Medication Refill (pt here today for medication refills and also needs to leave a urine sample for Tox[employer]ure)   HPI THORSTEN CLIMER presents for pain 1-2 as long as using patch. Has bone to bone arthritis at L5.  Pain assessment: Cause of pain-  Pain location- lumbar 5 Pain on scale of 1-10- 1-2 Frequency- What increases pain-nothing as long as takes med What makes pain Better-none Effects on ADL - none Any change in general medical condition-non  Current medications- butrans Effectiveness of current meds-excellent Adverse reactions form pain meds-denies Morphine equivalentN/A  Pill count performed-No Urine drug screen- No Was the [place] reviewed- yes  If yes were their any concerning findings? - no  Follow up for testosterone deficiency: Pt. Using medication as directed. Denies any sx referrable to DVT such as edema or erythema of legs. No dyspnea or chest pain. Energy level reported as being good. Libido is normal and denies E.D. Feels strength is adequate and improved from baseline.  Patient in for follow-up of elevated cholesterol. Doing well without complaints on current medication. Denies side effects of statin including myalgia and arthralgia and nausea. Also in today for liver function testing. Currently no chest pain, shortness of breath or other cardiovascular related symptoms noted. Patient in for follow-up of GERD. Currently asymptomatic taking  PPI daily. There is no chest pain or heartburn. No hematemesis and no melena. No dysphagia or choking. Onset is remote. Progression is stable. Complicating factors, none.    History Rashidi has a past medical history of Allergic rhinitis; Chronic pain; DDD (degenerative disc disease) (12/28/08); Femur fracture, right ([place]); GERD (gastroesophageal reflux disease); Hematuria; History of GI bleed; Hyperlipidemia; Hypogonadism  male; Hypothyroidism; Internal hemorrhoid; and PVD (peripheral vascular disease) ([place]).   He has a past surgical history that includes Carpal tunnel release (8/11) and right ankle  (1976).   His family history includes Anxiety disorder in his father; Depression in his father; Diabetes in his brother and father; Lung cancer in his father; Peripheral vascular disease in his father.He reports that he has quit smoking. He has never used smokeless tobacco. He reports that he drinks alcohol. He reports that he does not use drugs.    Medication List       Accurate as of 04/18/16  1:14 PM. Always use your most recent med list.          atorvastatin 40 MG tablet Commonly known as:  LIPITOR TAKE ONE (1) TABLET EACH DAY   buprenorphine 10 MCG/HR Ptwk patch Commonly known as:  BUTRANS APPLY 1 PATCH EVERY WEEK   CALCIUM + D3 PO Take by mouth.   CENTRUM SILVER PO Take 1 tablet by mouth daily.   cetirizine 10 MG tablet Commonly known as:  ZYRTEC Take 1 tablet (10 mg total) by mouth daily.   clobetasol cream 0.05 % Commonly known as:  TEMOVATE Apply 1 application topically 2 (two) times daily.   fluticasone 50 MCG/ACT nasal spray Commonly known as:  FLONASE USE 2 SPRAYS IN EACH NOSTRIL AT BEDTIME   meloxicam 15 MG tablet Commonly known as:  MOBIC TAKE ONE (1) TABLET EACH DAY   Methylcellulose (Laxative) 500 MG Tabs Take 2 tablets by mouth daily.   montelukast 10 MG tablet Commonly known as:  SINGULAIR TAKE ONE TABLET DAILY AT BEDTIME   omeprazole 40 MG capsule Commonly known as:  PRILOSEC TAKE  ONE (1) CAPSULE EACH DAY   polyethylene glycol powder powder Commonly known as:  GLYCOLAX/MIRALAX MIX 17 GRAMS INTO 8OZ OF WATER AND DRINK DAILY   testosterone cypionate 200 MG/ML injection Commonly known as:  DEPOTESTOSTERONE CYPIONATE Inject 1 mL (200 mg total) into the muscle every 14 (fourteen) days.        ROS Review of Systems  Constitutional: Negative for chills,  diaphoresis, fever and unexpected weight change.  HENT: Negative for congestion, hearing loss, rhinorrhea and sore throat.   Eyes: Negative for visual disturbance.  Respiratory: Negative for cough and shortness of breath.   Cardiovascular: Negative for chest pain.  Gastrointestinal: Negative for abdominal pain, constipation and diarrhea.  Genitourinary: Negative for dysuria and flank pain.  Musculoskeletal: Positive for arthralgias and back pain. Negative for joint swelling.  Skin: Negative for rash.  Neurological: Negative for dizziness and headaches.  Psychiatric/Behavioral: Negative for dysphoric mood and sleep disturbance.    Objective:  BP 101/62   Pulse 71   Temp 98.4 F (36.9 C) (Oral)   Ht '5\' 9"'$  (1.753 m)   Wt 225 lb (102.1 kg)   BMI 33.23 kg/m   BP Readings from Last 3 Encounters:  04/18/16 101/62  01/04/16 114/76  10/07/15 112/74    Wt Readings from Last 3 Encounters:  04/18/16 225 lb (102.1 kg)  01/04/16 232 lb 12.8 oz (105.6 kg)  10/07/15 223 lb (101.2 kg)     Physical Exam  Constitutional: He is oriented to person, place, and time. He appears well-developed and well-nourished. No distress.  HENT:  Head: Normocephalic and atraumatic.  Right Ear: External ear normal.  Left Ear: External ear normal.  Nose: Nose normal.  Mouth/Throat: Oropharynx is clear and moist.  Eyes: Conjunctivae and EOM are normal. Pupils are equal, round, and reactive to light.  Neck: Normal range of motion. Neck supple. No thyromegaly present.  Cardiovascular: Normal rate, regular rhythm and normal heart sounds.   No murmur heard. Pulmonary/Chest: Effort normal and breath sounds normal. No respiratory distress. He has no wheezes. He has no rales.  Abdominal: Soft. Bowel sounds are normal. He exhibits no distension. There is no tenderness.  Lymphadenopathy:    He has no cervical adenopathy.  Neurological: He is alert and oriented to person, place, and time. He has normal reflexes.    Skin: Skin is warm and dry.  Psychiatric: He has a normal mood and affect. His behavior is normal. Judgment and thought content normal.     Lab Results  Component Value Date   WBC 5.0 01/04/2016   HGB 15.5 09/22/2014   HCT 45.1 01/04/2016   PLT 228 01/04/2016   GLUCOSE 105 (H) 01/04/2016   CHOL 132 08/12/2015   TRIG 97 08/12/2015   HDL 46 08/12/2015   LDLCALC 67 08/12/2015   ALT 22 01/04/2016   AST 16 01/04/2016   NA 140 01/04/2016   K 4.1 01/04/2016   CL 101 01/04/2016   CREATININE 0.93 01/04/2016   BUN 20 01/04/2016   CO2 24 01/04/2016   TSH 2.110 01/17/2014   PSA 0.4 09/22/2014    [name]  Result Date: 08/09/2007 Clinical Data: Decreased testosterone level.  MRI BRAIN AND SELLA WITHOUT AND WITH CONTRAST: Technique: Multiplanar and multiecho pulse sequences of the brain and surrounding structures were obtained according to standard protocol before and after administration of intravenous contrast. Contrast: 14 cc Magnevist. Findings: The brain itself has a normal appearance on all pulse sequences without evidence of atrophy,  stroke, mass, hemorrhage, hydrocephalus, or extra-axial collection.  The pituitary gland is within normal limits in size measuring 4.4 cm cephalocaudal and 8.0 cm front to back. The enhancement pattern is normal. No evidence of adenoma. No abnormal brain enhancement occurs.  IMPRESSION: Normal examination. No abnormality of the hypothalamus or pituitary identified.  Provider: Lavonia Dana   Assessment & Plan:   Romon was seen today for medication refill.  Diagnoses and all orders for this visit:  Testosterone deficiency -     CBC with Differential/Platelet -     CMP14+EGFR -     Testosterone,Free and Total; Standing  Hypogonadism male -     CBC with Differential/Platelet -     CMP14+EGFR -     Testosterone,Free and Total; Standing  Mixed hyperlipidemia -     CBC with Differential/Platelet -     CMP14+EGFR -     Lipid  panel  Opioid type dependence, continuous (HCC) -     CBC with Differential/Platelet -     CMP14+EGFR  Pain medication agreement completed -     CBC with Differential/Platelet -     CMP14+EGFR  Spinal stenosis of lumbar region, unspecified whether neurogenic claudication present -     MR LUMBAR SPINE WO CONTRAST; Future  Gastroesophageal reflux disease without esophagitis  Other orders -     buprenorphine (BUTRANS) 10 MCG/HR PTWK patch; APPLY 1 PATCH EVERY WEEK   To assess your testosterone response to your shot, you need to have your level drawn on October 31 before you take your shot. Come in have your blood drawn for your testosterone. Then take your shot. Go on about your regular day. Then, the next morning come back in for another testosterone level. With these 2 testosterone levels one before and one after the shot I can determine if her dose is appropriate needs to be more frequent or needs to be a higher dose.   I have discontinued Mr. Colgan diclofenac. I am also having him maintain his Calcium Carb-Cholecalciferol (CALCIUM + D3 PO), Methylcellulose (Laxative), Multiple Vitamins-Minerals (CENTRUM SILVER PO), cetirizine, clobetasol cream, testosterone cypionate, fluticasone, atorvastatin, montelukast, omeprazole, polyethylene glycol powder, meloxicam, and buprenorphine. We will continue to administer testosterone cypionate.  Meds ordered this encounter  Medications  . buprenorphine (BUTRANS) 10 MCG/HR PTWK patch    Sig: APPLY 1 PATCH EVERY WEEK    Dispense:  4 patch    Refill:  2    Do not fill prior to January 27, 2016     Follow-up: Return in about 3 months (around 07/19/2016).  Claretta Fraise, M.D.

## 2016-04-18 NOTE — Patient Instructions (Signed)
To assess your testosterone response to your shot, you need to have your level drawn on October 31 before you take your shot. Come in have your blood drawn for your testosterone. Then take your shot. Go on about your regular day. Then, the next morning come back in for another testosterone level. With these 2 testosterone levels one before and one after the shot I can determine if her dose is appropriate needs to be more frequent or needs to be a higher dose.

## 2016-04-19 ENCOUNTER — Other Ambulatory Visit: Payer: Self-pay

## 2016-04-19 ENCOUNTER — Other Ambulatory Visit: Payer: Self-pay | Admitting: Family Medicine

## 2016-04-19 DIAGNOSIS — R739 Hyperglycemia, unspecified: Secondary | ICD-10-CM

## 2016-04-19 LAB — CBC WITH DIFFERENTIAL/PLATELET
BASOS: 0 %
Basophils Absolute: 0 10*3/uL (ref 0.0–0.2)
EOS (ABSOLUTE): 0 10*3/uL (ref 0.0–0.4)
Eos: 1 %
HEMOGLOBIN: 16.2 g/dL (ref 12.6–17.7)
Hematocrit: 45.8 % (ref 37.5–51.0)
IMMATURE GRANS (ABS): 0 10*3/uL (ref 0.0–0.1)
Immature Granulocytes: 0 %
LYMPHS ABS: 1.6 10*3/uL (ref 0.7–3.1)
LYMPHS: 30 %
MCH: 29.6 pg (ref 26.6–33.0)
MCHC: 35.4 g/dL (ref 31.5–35.7)
MCV: 84 fL (ref 79–97)
MONOCYTES: 9 %
Monocytes Absolute: 0.5 10*3/uL (ref 0.1–0.9)
NEUTROS ABS: 3.2 10*3/uL (ref 1.4–7.0)
Neutrophils: 60 %
Platelets: 235 10*3/uL (ref 150–379)
RBC: 5.47 x10E6/uL (ref 4.14–5.80)
RDW: 12.9 % (ref 12.3–15.4)
WBC: 5.3 10*3/uL (ref 3.4–10.8)

## 2016-04-19 LAB — CMP14+EGFR
ALBUMIN: 3.9 g/dL (ref 3.5–5.5)
ALT: 17 IU/L (ref 0–44)
AST: 15 IU/L (ref 0–40)
Albumin/Globulin Ratio: 1.6 (ref 1.2–2.2)
Alkaline Phosphatase: 89 IU/L (ref 39–117)
BUN / CREAT RATIO: 17 (ref 9–20)
BUN: 15 mg/dL (ref 6–24)
Bilirubin Total: 0.5 mg/dL (ref 0.0–1.2)
CALCIUM: 8.7 mg/dL (ref 8.7–10.2)
CO2: 28 mmol/L (ref 18–29)
CREATININE: 0.89 mg/dL (ref 0.76–1.27)
Chloride: 101 mmol/L (ref 96–106)
GFR calc non Af Amer: 96 mL/min/{1.73_m2} (ref >59)
GFR, EST AFRICAN AMERICAN: 110 mL/min/{1.73_m2} (ref >59)
GLUCOSE: 104 mg/dL — AB (ref 65–99)
Globulin, Total: 2.5 g/dL (ref 1.5–4.5)
Potassium: 4.4 mmol/L (ref 3.5–5.2)
Sodium: 142 mmol/L (ref 134–144)
TOTAL PROTEIN: 6.4 g/dL (ref 6.0–8.5)

## 2016-04-19 LAB — LIPID PANEL
CHOLESTEROL TOTAL: 120 mg/dL (ref 100–199)
Chol/HDL Ratio: 3.8 ratio units (ref 0.0–5.0)
HDL: 32 mg/dL — ABNORMAL LOW (ref >39)
LDL CALC: 69 mg/dL (ref 0–99)
Triglycerides: 97 mg/dL (ref 0–149)
VLDL CHOLESTEROL CAL: 19 mg/dL (ref 5–40)

## 2016-04-19 NOTE — Addendum Note (Signed)
Addended by: Quay BurowPOTTER, JANICE K on: 04/19/2016 09:24 AM   Modules accepted: Orders

## 2016-04-19 NOTE — Addendum Note (Signed)
Addended by: Orma RenderHODGES, Meilyn Heindl F on: 04/19/2016 12:59 PM   Modules accepted: Orders

## 2016-04-21 ENCOUNTER — Other Ambulatory Visit: Payer: Self-pay

## 2016-04-22 MED ORDER — TESTOSTERONE CYPIONATE 200 MG/ML IM SOLN: 200.0000 mg | INTRAMUSCULAR | 2 refills | Status: DC

## 2016-04-26 ENCOUNTER — Other Ambulatory Visit (INDEPENDENT_AMBULATORY_CARE_PROVIDER_SITE_OTHER): Payer: Federal, State, Local not specified - PPO

## 2016-04-26 ENCOUNTER — Ambulatory Visit (INDEPENDENT_AMBULATORY_CARE_PROVIDER_SITE_OTHER): Payer: Federal, State, Local not specified - PPO | Admitting: *Deleted

## 2016-04-26 DIAGNOSIS — E349 Endocrine disorder, unspecified: Secondary | ICD-10-CM

## 2016-04-26 DIAGNOSIS — R739 Hyperglycemia, unspecified: Secondary | ICD-10-CM

## 2016-04-26 DIAGNOSIS — E291 Testicular hypofunction: Secondary | ICD-10-CM

## 2016-04-26 LAB — BAYER DCA HB A1C WAIVED: HB A1C (BAYER DCA - WAIVED): 5.6 % (ref <7.0)

## 2016-04-26 NOTE — Progress Notes (Signed)
Pt given Testosterone inj Tolerated well 

## 2016-04-27 ENCOUNTER — Other Ambulatory Visit (INDEPENDENT_AMBULATORY_CARE_PROVIDER_SITE_OTHER): Payer: Federal, State, Local not specified - PPO

## 2016-04-27 DIAGNOSIS — E291 Testicular hypofunction: Secondary | ICD-10-CM | POA: Diagnosis not present

## 2016-04-27 LAB — TESTOSTERONE,FREE AND TOTAL
TESTOSTERONE: 296 ng/dL (ref 264–916)
Testosterone, Free: 6.7 pg/mL — ABNORMAL LOW (ref 7.2–24.0)

## 2016-04-28 LAB — TESTOSTERONE,FREE AND TOTAL
TESTOSTERONE FREE: 24.3 pg/mL — AB (ref 7.2–24.0)
TESTOSTERONE: 1000 ng/dL — AB (ref 264–916)

## 2016-04-29 ENCOUNTER — Ambulatory Visit (HOSPITAL_COMMUNITY): Payer: Federal, State, Local not specified - PPO

## 2016-05-02 ENCOUNTER — Ambulatory Visit (HOSPITAL_COMMUNITY)
Admission: RE | Admit: 2016-05-02 | Discharge: 2016-05-02 | Disposition: A | Discharge status: Home / Self Care | Payer: Federal, State, Local not specified - PPO | Source: Ambulatory Visit | Attending: Family Medicine | Admitting: Family Medicine

## 2016-05-02 DIAGNOSIS — M47896 Other spondylosis, lumbar region: Secondary | ICD-10-CM | POA: Diagnosis not present

## 2016-05-02 DIAGNOSIS — M8938 Hypertrophy of bone, other site: Secondary | ICD-10-CM | POA: Diagnosis not present

## 2016-05-02 DIAGNOSIS — M48061 Spinal stenosis, lumbar region without neurogenic claudication: Secondary | ICD-10-CM | POA: Diagnosis not present

## 2016-05-02 DIAGNOSIS — M5126 Other intervertebral disc displacement, lumbar region: Secondary | ICD-10-CM | POA: Diagnosis not present

## 2016-05-02 DIAGNOSIS — M545 Low back pain: Secondary | ICD-10-CM | POA: Diagnosis not present

## 2016-05-10 ENCOUNTER — Ambulatory Visit (INDEPENDENT_AMBULATORY_CARE_PROVIDER_SITE_OTHER): Payer: Federal, State, Local not specified - PPO | Admitting: *Deleted

## 2016-05-10 DIAGNOSIS — E291 Testicular hypofunction: Secondary | ICD-10-CM

## 2016-05-10 DIAGNOSIS — E349 Endocrine disorder, unspecified: Secondary | ICD-10-CM

## 2016-05-10 NOTE — Progress Notes (Signed)
Pt given Testosterone inj Tolerated well 

## 2016-05-16 ENCOUNTER — Other Ambulatory Visit: Payer: Self-pay | Admitting: Family Medicine

## 2016-05-17 DIAGNOSIS — K080 Exfoliation of teeth due to systemic causes: Secondary | ICD-10-CM | POA: Diagnosis not present

## 2016-05-23 ENCOUNTER — Telehealth: Payer: Self-pay | Admitting: Family Medicine

## 2016-05-24 NOTE — Telephone Encounter (Signed)
Faxed /lat °

## 2016-05-25 ENCOUNTER — Ambulatory Visit (INDEPENDENT_AMBULATORY_CARE_PROVIDER_SITE_OTHER): Payer: Federal, State, Local not specified - PPO | Admitting: *Deleted

## 2016-05-25 DIAGNOSIS — E291 Testicular hypofunction: Secondary | ICD-10-CM | POA: Diagnosis not present

## 2016-05-25 DIAGNOSIS — E349 Endocrine disorder, unspecified: Secondary | ICD-10-CM

## 2016-05-25 NOTE — Progress Notes (Signed)
Pt given Testosterone inj Tolerated well 

## 2016-05-26 DIAGNOSIS — M94262 Chondromalacia, left knee: Secondary | ICD-10-CM | POA: Diagnosis not present

## 2016-05-26 DIAGNOSIS — M23222 Derangement of posterior horn of medial meniscus due to old tear or injury, left knee: Secondary | ICD-10-CM | POA: Diagnosis not present

## 2016-05-26 DIAGNOSIS — G8918 Other acute postprocedural pain: Secondary | ICD-10-CM | POA: Diagnosis not present

## 2016-05-26 DIAGNOSIS — M2242 Chondromalacia patellae, left knee: Secondary | ICD-10-CM | POA: Diagnosis not present

## 2016-05-26 DIAGNOSIS — S83242A Other tear of medial meniscus, current injury, left knee, initial encounter: Secondary | ICD-10-CM | POA: Diagnosis not present

## 2016-06-08 ENCOUNTER — Ambulatory Visit (INDEPENDENT_AMBULATORY_CARE_PROVIDER_SITE_OTHER): Payer: Federal, State, Local not specified - PPO

## 2016-06-08 ENCOUNTER — Ambulatory Visit: Payer: Federal, State, Local not specified - PPO

## 2016-06-08 DIAGNOSIS — E291 Testicular hypofunction: Secondary | ICD-10-CM | POA: Diagnosis not present

## 2016-06-08 DIAGNOSIS — E349 Endocrine disorder, unspecified: Secondary | ICD-10-CM

## 2016-06-15 ENCOUNTER — Other Ambulatory Visit: Payer: Self-pay | Admitting: Nurse Practitioner

## 2016-06-15 NOTE — Telephone Encounter (Signed)
Last OV was 04-18-16. Please advise on refill

## 2016-06-21 ENCOUNTER — Ambulatory Visit (INDEPENDENT_AMBULATORY_CARE_PROVIDER_SITE_OTHER): Payer: Federal, State, Local not specified - PPO | Admitting: *Deleted

## 2016-06-21 DIAGNOSIS — E291 Testicular hypofunction: Secondary | ICD-10-CM

## 2016-06-21 DIAGNOSIS — E349 Endocrine disorder, unspecified: Secondary | ICD-10-CM

## 2016-06-21 NOTE — Progress Notes (Signed)
Pt given Testosterone injection IM LUOQ and tolerated well. ?

## 2016-06-22 ENCOUNTER — Ambulatory Visit: Payer: Federal, State, Local not specified - PPO

## 2016-07-05 ENCOUNTER — Ambulatory Visit (INDEPENDENT_AMBULATORY_CARE_PROVIDER_SITE_OTHER): Payer: Federal, State, Local not specified - PPO | Admitting: *Deleted

## 2016-07-05 DIAGNOSIS — E349 Endocrine disorder, unspecified: Secondary | ICD-10-CM

## 2016-07-05 DIAGNOSIS — E291 Testicular hypofunction: Secondary | ICD-10-CM | POA: Diagnosis not present

## 2016-07-05 NOTE — Progress Notes (Signed)
Pt given Testosterone inj Tolerated well 

## 2016-07-18 ENCOUNTER — Encounter: Payer: Self-pay | Admitting: Family Medicine

## 2016-07-18 ENCOUNTER — Ambulatory Visit (INDEPENDENT_AMBULATORY_CARE_PROVIDER_SITE_OTHER): Payer: Federal, State, Local not specified - PPO | Admitting: Family Medicine

## 2016-07-18 VITALS — BP 127/71 | HR 71 | Temp 97.9°F | Ht 69.0 in | Wt 240.0 lb

## 2016-07-18 DIAGNOSIS — M48061 Spinal stenosis, lumbar region without neurogenic claudication: Secondary | ICD-10-CM

## 2016-07-18 DIAGNOSIS — M25562 Pain in left knee: Secondary | ICD-10-CM

## 2016-07-18 DIAGNOSIS — F1120 Opioid dependence, uncomplicated: Secondary | ICD-10-CM

## 2016-07-18 DIAGNOSIS — G8929 Other chronic pain: Secondary | ICD-10-CM

## 2016-07-18 MED ORDER — BUPRENORPHINE 10 MCG/HR TD PTWK: MEDICATED_PATCH | TRANSDERMAL | 2 refills | Status: DC

## 2016-07-18 MED ORDER — TESTOSTERONE CYPIONATE 200 MG/ML IM SOLN: 200.0000 mg | INTRAMUSCULAR | 2 refills | Status: DC

## 2016-07-18 NOTE — Progress Notes (Signed)
Subjective:  Patient ID: Cesar Leon, male    DOB: Sep 23, 1960  Age: 56 y.o. MRN: [id number]  CC: Medication Refill (pt here today for refill on his pain patch)   HPI Cesar Leon presents for Pain assessment: Cause of pain-  Pain location- Lower back, also left knee. Recent left knee surgery. Pain on scale of 1-10- 6-8 Frequency- What increases pain-bending at the waist What makes pain Better-nothing Effects on ADL - can do ADLs but not more vigorous activities frequently. He has ongoing disability. Any change in general medical condition-recent left the surgery. He was given some hydrocodone postop by his surgeon. Patient does report this today.  Current medications- buprenorphine, hydrocodone. Patient states he is no longer taking the hydrocodone as a Effectiveness of current meds-moderate Adverse reactions form pain meds-denied Morphine equivalent  Pill count performed-No Urine drug screen- No Was the [facility] reviewed- yes  If yes were their any concerning findings? - no    History Cesar Leon has a past medical history of Allergic rhinitis; Chronic pain; DDD (degenerative disc disease) (12/28/08); Femur fracture, right (HCC); GERD (gastroesophageal reflux disease); Hematuria; History of GI bleed; Hyperlipidemia; Hypogonadism male; Hypothyroidism; Internal hemorrhoid; and PVD (peripheral vascular disease) (HCC).   He has a past surgical history that includes Carpal tunnel release (8/11) and right ankle  (1976).   His family history includes Anxiety disorder in his father; Depression in his father; Diabetes in his brother and father; Lung cancer in his father; Peripheral vascular disease in his father.He reports that he has quit smoking. He has never used smokeless tobacco. He reports that he drinks alcohol. He reports that he does not use drugs.    ROS Review of Systems  Constitutional: Negative for chills, diaphoresis and fever.  HENT: Negative for rhinorrhea and  sore throat.   Respiratory: Negative for cough and shortness of breath.   Cardiovascular: Negative for chest pain.  Gastrointestinal: Negative for abdominal pain.  Musculoskeletal: Positive for back pain. Negative for arthralgias and myalgias.  Skin: Negative for rash.  Neurological: Negative for weakness and headaches.    Objective:  BP 127/71   Pulse 71   Temp 97.9 F (36.6 C) (Oral)   Ht 5\' 9"  (1.753 m)   Wt 240 lb (108.9 kg)   BMI 35.44 kg/m   BP Readings from Last 3 Encounters:  07/18/16 127/71  04/18/16 101/62  01/04/16 114/76    Wt Readings from Last 3 Encounters:  07/18/16 240 lb (108.9 kg)  04/18/16 225 lb (102.1 kg)  01/04/16 232 lb 12.8 oz (105.6 kg)     Physical Exam  Constitutional: He is oriented to person, place, and time. He appears well-developed and well-nourished. No distress.  HENT:  Head: Normocephalic and atraumatic.  Right Ear: External ear normal.  Left Ear: External ear normal.  Nose: Nose normal.  Mouth/Throat: Oropharynx is clear and moist.  Eyes: Conjunctivae and EOM are normal. Pupils are equal, round, and reactive to light.  Neck: Normal range of motion. Neck supple. No thyromegaly present.  Cardiovascular: Normal rate, regular rhythm and normal heart sounds.   No murmur heard. Pulmonary/Chest: Effort normal and breath sounds normal. No respiratory distress. He has no wheezes. He has no rales.  Abdominal: Soft. Bowel sounds are normal. He exhibits no distension. There is no tenderness.  Lymphadenopathy:    He has no cervical adenopathy.  Neurological: He is alert and oriented to person, place, and time. He has normal reflexes.  Skin: Skin is warm and  dry.  Psychiatric: He has a normal mood and affect. His behavior is normal. Judgment and thought content normal.    Cesar Leon  Result Date: 05/02/2016 CLINICAL DATA:  Low back pain extending into both lower extremities for approximately 6 years. EXAM: MRI LUMBAR SPINE  WITHOUT Leon TECHNIQUE: Multiplanar, multisequence Cesar imaging of the lumbar spine was performed. No intravenous Leon was administered. COMPARISON:  CT the abdomen pelvis 03/22/2006. FINDINGS: Segmentation: 5 non rib-bearing lumbar type vertebral bodies are present. Alignment: Slight anterolisthesis is present at L2-3 and L4-5. AP alignment is otherwise anatomic. Leftward curvature of the lumbar spine is centered at L3-4. Vertebrae: Chronic endplate marrow changes at L5-S1 are more prominent on the left. Conus medullaris: Extends to the T12-L1 level and appears normal. Paraspinal and other soft tissues: Limited imaging of the abdomen is unremarkable. There is no significant adenopathy. The paraspinous musculature is within normal limits. Disc levels: L1-2: A broad-based disc protrusion is present. Mild facet hypertrophy is noted bilaterally. The central canal is preserved. Moderate foraminal narrowing is worse on the left. L2-3: A broad-based disc protrusion is present. Moderate facet hypertrophy is noted bilaterally. This results in moderate subarticular narrowing bilaterally, left greater than right. Severe left and moderate right foraminal stenosis is present. L3-4: A broad-based disc protrusion is present. A central annular tear is suggested. The central canal is preserved. Mild foraminal narrowing is worse on the right. L4-5: A broad-based disc protrusion is present. Moderate facet hypertrophy and ligamentum flavum thickening is present bilaterally. This results in severe central canal stenosis. Moderate foraminal stenosis is present bilaterally. L5-S1: A broad-based disc protrusion is present. Mild facet hypertrophy is noted. Mild subarticular and foraminal narrowing is slightly worse on the right. IMPRESSION: 1. Multilevel spondylosis of the lumbar spine is most severe at L2-3 and L4-5. 2. Severe central canal stenosis and moderate foraminal narrowing bilaterally at L4-5 secondary to a broad-based disc  protrusion and moderate facet hypertrophy. Given the degree of facet disease, dynamic anterolisthesis may be present. Flexion and extension views of the lumbar spine may be helpful. 3. Moderate subarticular stenosis at L2-3 is worse on the left. 4. Severe left and moderate right foraminal narrowing at L2-3. 5. Moderate foraminal stenosis at L1-2 is worse on the left. 6. Mild foraminal narrowing at L3-4 is worse on the right. 7. Mild subarticular and foraminal stenosis bilaterally at L5-S1 is worse on the right. Electronically Signed   By: Marin Roberts M.D.   On: 05/02/2016 09:38    Assessment & Plan:   Selma was seen today for medication refill.  Diagnoses and all orders for this visit:  Opioid type dependence, continuous (HCC)  Spinal stenosis of lumbar region, unspecified whether neurogenic claudication present  Chronic pain of left knee  Other orders -     testosterone cypionate (DEPOTESTOSTERONE CYPIONATE) 200 MG/ML injection; Inject 1 mL (200 mg total) into the muscle every 14 (fourteen) days. -     buprenorphine (BUTRANS) 10 MCG/HR PTWK patch; APPLY 1 PATCH EVERY WEEK      I am having Cesar Leon maintain his Calcium Carb-Cholecalciferol (CALCIUM + D3 PO), Methylcellulose (Laxative), Multiple Vitamins-Minerals (CENTRUM SILVER PO), cetirizine, clobetasol cream, montelukast, polyethylene glycol powder, fluticasone, omeprazole, meloxicam, atorvastatin, methocarbamol, testosterone cypionate, and buprenorphine. We will continue to administer testosterone cypionate.  Allergies as of 07/18/2016      Reactions   Codeine    Penicillins       Medication List       Accurate  as of 07/18/16 10:12 PM. Always use your most recent med list.          atorvastatin 40 MG tablet Commonly known as:  LIPITOR TAKE ONE (1) TABLET EACH DAY   buprenorphine 10 MCG/HR Ptwk patch Commonly known as:  BUTRANS APPLY 1 PATCH EVERY WEEK   CALCIUM + D3 PO Take by mouth.   CENTRUM SILVER  PO Take 1 tablet by mouth daily.   cetirizine 10 MG tablet Commonly known as:  ZYRTEC Take 1 tablet (10 mg total) by mouth daily.   clobetasol cream 0.05 % Commonly known as:  TEMOVATE Apply 1 application topically 2 (two) times daily.   fluticasone 50 MCG/ACT nasal spray Commonly known as:  FLONASE USE 2 SPRAYS IN EACH NOSTRIL AT BEDTIME   meloxicam 15 MG tablet Commonly known as:  MOBIC TAKE ONE (1) TABLET EACH DAY   methocarbamol 500 MG tablet Commonly known as:  ROBAXIN   Methylcellulose (Laxative) 500 MG Tabs Take 2 tablets by mouth daily.   montelukast 10 MG tablet Commonly known as:  SINGULAIR TAKE ONE TABLET DAILY AT BEDTIME   omeprazole 40 MG capsule Commonly known as:  PRILOSEC TAKE ONE (1) CAPSULE EACH DAY   polyethylene glycol powder powder Commonly known as:  GLYCOLAX/MIRALAX MIX 17 GRAMS INTO 8OZ OF WATER AND DRINK DAILY   testosterone cypionate 200 MG/ML injection Commonly known as:  DEPOTESTOSTERONE CYPIONATE Inject 1 mL (200 mg total) into the muscle every 14 (fourteen) days.        Follow-up: Return in about 3 months (around 10/16/2016).  Mechele ClaudeWarren Clorissa Gruenberg, M.D.

## 2016-07-19 ENCOUNTER — Ambulatory Visit (INDEPENDENT_AMBULATORY_CARE_PROVIDER_SITE_OTHER): Payer: Federal, State, Local not specified - PPO

## 2016-07-19 DIAGNOSIS — E291 Testicular hypofunction: Secondary | ICD-10-CM

## 2016-07-19 DIAGNOSIS — E349 Endocrine disorder, unspecified: Secondary | ICD-10-CM

## 2016-08-02 ENCOUNTER — Ambulatory Visit (INDEPENDENT_AMBULATORY_CARE_PROVIDER_SITE_OTHER): Payer: Federal, State, Local not specified - PPO | Admitting: *Deleted

## 2016-08-02 DIAGNOSIS — E349 Endocrine disorder, unspecified: Secondary | ICD-10-CM | POA: Diagnosis not present

## 2016-08-02 NOTE — Progress Notes (Signed)
Pt given testosterone inj Tolerated well 

## 2016-08-16 ENCOUNTER — Ambulatory Visit (INDEPENDENT_AMBULATORY_CARE_PROVIDER_SITE_OTHER): Payer: Federal, State, Local not specified - PPO | Admitting: *Deleted

## 2016-08-16 DIAGNOSIS — E291 Testicular hypofunction: Secondary | ICD-10-CM

## 2016-08-16 DIAGNOSIS — E349 Endocrine disorder, unspecified: Secondary | ICD-10-CM

## 2016-08-16 NOTE — Progress Notes (Signed)
Pt given Testosterone inj Tolerated well 

## 2016-08-30 ENCOUNTER — Ambulatory Visit (INDEPENDENT_AMBULATORY_CARE_PROVIDER_SITE_OTHER): Payer: Federal, State, Local not specified - PPO | Admitting: *Deleted

## 2016-08-30 DIAGNOSIS — E291 Testicular hypofunction: Secondary | ICD-10-CM | POA: Diagnosis not present

## 2016-08-30 DIAGNOSIS — E349 Endocrine disorder, unspecified: Secondary | ICD-10-CM

## 2016-08-30 NOTE — Progress Notes (Signed)
Pt given testosterone inj Tolerated well 

## 2016-09-13 ENCOUNTER — Ambulatory Visit (INDEPENDENT_AMBULATORY_CARE_PROVIDER_SITE_OTHER): Payer: Federal, State, Local not specified - PPO | Admitting: *Deleted

## 2016-09-13 DIAGNOSIS — E291 Testicular hypofunction: Secondary | ICD-10-CM

## 2016-09-13 DIAGNOSIS — E349 Endocrine disorder, unspecified: Secondary | ICD-10-CM

## 2016-09-13 NOTE — Progress Notes (Signed)
Pt given Testosterone inj Tolerated well 

## 2016-09-14 ENCOUNTER — Ambulatory Visit: Payer: Federal, State, Local not specified - PPO

## 2016-09-17 ENCOUNTER — Other Ambulatory Visit: Payer: Self-pay | Admitting: Family Medicine

## 2016-09-27 ENCOUNTER — Ambulatory Visit (INDEPENDENT_AMBULATORY_CARE_PROVIDER_SITE_OTHER): Payer: Federal, State, Local not specified - PPO | Admitting: *Deleted

## 2016-09-27 DIAGNOSIS — E349 Endocrine disorder, unspecified: Secondary | ICD-10-CM

## 2016-09-27 DIAGNOSIS — E291 Testicular hypofunction: Secondary | ICD-10-CM

## 2016-09-27 NOTE — Progress Notes (Signed)
Pt given Testosterone inj Tolerated well 

## 2016-10-11 ENCOUNTER — Ambulatory Visit (INDEPENDENT_AMBULATORY_CARE_PROVIDER_SITE_OTHER): Payer: Federal, State, Local not specified - PPO | Admitting: *Deleted

## 2016-10-11 ENCOUNTER — Other Ambulatory Visit: Payer: Federal, State, Local not specified - PPO

## 2016-10-11 DIAGNOSIS — M48061 Spinal stenosis, lumbar region without neurogenic claudication: Secondary | ICD-10-CM | POA: Diagnosis not present

## 2016-10-11 DIAGNOSIS — K219 Gastro-esophageal reflux disease without esophagitis: Secondary | ICD-10-CM

## 2016-10-11 DIAGNOSIS — E349 Endocrine disorder, unspecified: Secondary | ICD-10-CM

## 2016-10-11 DIAGNOSIS — E291 Testicular hypofunction: Secondary | ICD-10-CM

## 2016-10-11 DIAGNOSIS — E785 Hyperlipidemia, unspecified: Secondary | ICD-10-CM

## 2016-10-11 NOTE — Progress Notes (Signed)
Pt given Testosterone inj Tolerated well 

## 2016-10-12 ENCOUNTER — Other Ambulatory Visit: Payer: Federal, State, Local not specified - PPO

## 2016-10-12 DIAGNOSIS — E349 Endocrine disorder, unspecified: Secondary | ICD-10-CM

## 2016-10-12 DIAGNOSIS — E291 Testicular hypofunction: Secondary | ICD-10-CM

## 2016-10-12 LAB — TESTOSTERONE,FREE AND TOTAL
TESTOSTERONE FREE: 7.3 pg/mL (ref 7.2–24.0)
TESTOSTERONE: 345 ng/dL (ref 264–916)

## 2016-10-13 LAB — TESTOSTERONE,FREE AND TOTAL
TESTOSTERONE FREE: 10.7 pg/mL (ref 7.2–24.0)
Testosterone: 826 ng/dL (ref 264–916)

## 2016-10-14 ENCOUNTER — Other Ambulatory Visit: Payer: Self-pay | Admitting: Family Medicine

## 2016-10-24 ENCOUNTER — Encounter: Payer: Self-pay | Admitting: Family Medicine

## 2016-10-24 ENCOUNTER — Ambulatory Visit (INDEPENDENT_AMBULATORY_CARE_PROVIDER_SITE_OTHER): Payer: Federal, State, Local not specified - PPO | Admitting: Family Medicine

## 2016-10-24 VITALS — BP 134/71 | HR 57 | Temp 97.3°F | Ht 69.0 in | Wt 215.0 lb

## 2016-10-24 DIAGNOSIS — M48061 Spinal stenosis, lumbar region without neurogenic claudication: Secondary | ICD-10-CM

## 2016-10-24 DIAGNOSIS — E291 Testicular hypofunction: Secondary | ICD-10-CM | POA: Diagnosis not present

## 2016-10-24 DIAGNOSIS — F1120 Opioid dependence, uncomplicated: Secondary | ICD-10-CM

## 2016-10-24 DIAGNOSIS — E782 Mixed hyperlipidemia: Secondary | ICD-10-CM | POA: Diagnosis not present

## 2016-10-24 MED ORDER — TESTOSTERONE CYPIONATE 200 MG/ML IM SOLN: 200.0000 mg | INTRAMUSCULAR | 2 refills | Status: DC

## 2016-10-24 MED ORDER — BUPRENORPHINE 10 MCG/HR TD PTWK: MEDICATED_PATCH | TRANSDERMAL | 2 refills | Status: DC

## 2016-10-24 NOTE — Progress Notes (Signed)
Subjective:  Patient ID: Cesar Leon, male    DOB: [birth year]  Age: 56 y.o. MRN: [id number]  CC: Medication Refill (pt here today for refills on his pain patch)   HPI Cesar Leon presents for  Pain assessment: Cause of pain- herniated L1-2, L2-3, L3-4, L4-5, L5- S1 See MRI of 05/02/2016 Pain location- lower back Pain on scale of 1-10- 2-3/10 Frequency- What increases pain-bending What makes pain Better-nothing Effects on ADL - none as long as taking meds Any change in general medical condition-denies  Current medications- buprenophine patch Effectiveness of current meds-excellent Adverse reactions form pain meds- denies Morphine equivalent18  Pill count performed-No Urine drug screen- No Was the [place] reviewed- yes  If yes were their any concerning findings? - no   Follow up for testosterone deficiency: Pt. Using medication as directed. Denies any sx referrable to DVT such as edema or erythema of legs. No dyspnea or chest pain. Energy level reported as being good. Libido is normal and denies E.D. Feels strength is adequate and improved from baseline. Patient had peak and trough levels drawn several days ago showing excellent range within normal limits.   History Cesar Leon has a past medical history of Allergic rhinitis; Chronic pain; DDD (degenerative disc disease) (12/28/08); Femur fracture, right ([place]); GERD (gastroesophageal reflux disease); Hematuria; History of GI bleed; Hyperlipidemia; Hypogonadism male; Hypothyroidism; Internal hemorrhoid; and PVD (peripheral vascular disease) ([place]).   He has a past surgical history that includes Carpal tunnel release (8/11) and right ankle  (1976).   His family history includes Anxiety disorder in his father; Depression in his father; Diabetes in his brother and father; Lung cancer in his father; Peripheral vascular disease in his father.He reports that he has quit smoking. He has never used smokeless tobacco. He reports that he  drinks alcohol. He reports that he does not use drugs.    ROS Review of Systems  Constitutional: Negative for chills, diaphoresis and fever.  HENT: Negative for rhinorrhea and sore throat.   Respiratory: Negative for cough and shortness of breath.   Cardiovascular: Negative for chest pain.  Gastrointestinal: Negative for abdominal pain.  Musculoskeletal: Negative for arthralgias and myalgias.  Skin: Negative for rash.  Neurological: Negative for weakness and headaches.    Objective:  BP 134/71   Pulse (!) 57   Temp 97.3 F (36.3 C) (Oral)   Ht _0  (1.753 m)   Wt 215 lb (97.5 kg)   BMI 31.75 kg/m   BP Readings from Last 3 Encounters:  10/24/16 134/71  07/18/16 127/71  04/18/16 101/62    Wt Readings from Last 3 Encounters:  10/24/16 215 lb (97.5 kg)  07/18/16 240 lb (108.9 kg)  04/18/16 225 lb (102.1 kg)     Physical Exam  Constitutional: He appears well-developed and well-nourished.  HENT:  Head: Normocephalic and atraumatic.  Right Ear: Tympanic membrane and external ear normal. No decreased hearing is noted.  Left Ear: Tympanic membrane and external ear normal. No decreased hearing is noted.  Mouth/Throat: No oropharyngeal exudate or posterior oropharyngeal erythema.  Eyes: Pupils are equal, round, and reactive to light.  Neck: Normal range of motion. Neck supple.  Cardiovascular: Normal rate and regular rhythm.   No murmur heard. Pulmonary/Chest: Breath sounds normal. No respiratory distress.  Abdominal: Soft. Bowel sounds are normal. He exhibits no mass. There is no tenderness.  Skin: Skin is warm and dry.  Psychiatric: He has a normal mood and affect.  Vitals reviewed.  Assessment & Plan:   Vilas was seen today for medication refill.  Diagnoses and all orders for this visit:  Hypogonadism male  Mixed hyperlipidemia -     CMP14+EGFR -     Lipid panel  Spinal stenosis of lumbar region, unspecified whether neurogenic claudication  present  Opioid type dependence, continuous ([place])  Other orders -     buprenorphine (BUTRANS) 10 MCG/HR PTWK patch; APPLY 1 PATCH EVERY WEEK -     testosterone cypionate (DEPOTESTOSTERONE CYPIONATE) 200 MG/ML injection; Inject 1 mL (200 mg total) into the muscle every 14 (fourteen) days.       I am having Cesar Leon maintain his Calcium Carb-Cholecalciferol (CALCIUM + D3 PO), Methylcellulose (Laxative), Multiple Vitamins-Minerals (CENTRUM SILVER PO), cetirizine, clobetasol cream, montelukast, polyethylene glycol powder, omeprazole, meloxicam, atorvastatin, methocarbamol, fluticasone, buprenorphine, and testosterone cypionate. We will stop administering testosterone cypionate.  Allergies as of 10/24/2016      Reactions   Codeine    Penicillins       Medication List       Accurate as of 10/24/16  5:37 PM. Always use your most recent med list.          atorvastatin 40 MG tablet Commonly known as:  LIPITOR TAKE ONE (1) TABLET EACH DAY   buprenorphine 10 MCG/HR Ptwk patch Commonly known as:  BUTRANS APPLY 1 PATCH EVERY WEEK   CALCIUM + D3 PO Take by mouth.   CENTRUM SILVER PO Take 1 tablet by mouth daily.   cetirizine 10 MG tablet Commonly known as:  ZYRTEC Take 1 tablet (10 mg total) by mouth daily.   clobetasol cream 0.05 % Commonly known as:  TEMOVATE Apply 1 application topically 2 (two) times daily.   fluticasone 50 MCG/ACT nasal spray Commonly known as:  FLONASE USE 2 SPRAYS IN EACH NOSTRIL AT BEDTIME   meloxicam 15 MG tablet Commonly known as:  MOBIC TAKE ONE (1) TABLET EACH DAY   methocarbamol 500 MG tablet Commonly known as:  ROBAXIN   Methylcellulose (Laxative) 500 MG Tabs Take 2 tablets by mouth daily.   montelukast 10 MG tablet Commonly known as:  SINGULAIR TAKE ONE TABLET DAILY AT BEDTIME   omeprazole 40 MG capsule Commonly known as:  PRILOSEC TAKE ONE (1) CAPSULE EACH DAY   polyethylene glycol powder powder Commonly known as:   GLYCOLAX/MIRALAX MIX 17 GRAMS INTO 8OZ OF WATER AND DRINK DAILY   testosterone cypionate 200 MG/ML injection Commonly known as:  DEPOTESTOSTERONE CYPIONATE Inject 1 mL (200 mg total) into the muscle every 14 (fourteen) days.        Follow-up: No Follow-up on file.  Claretta Fraise, M.D.

## 2016-10-25 ENCOUNTER — Ambulatory Visit (INDEPENDENT_AMBULATORY_CARE_PROVIDER_SITE_OTHER): Payer: Federal, State, Local not specified - PPO | Admitting: *Deleted

## 2016-10-25 DIAGNOSIS — E349 Endocrine disorder, unspecified: Secondary | ICD-10-CM

## 2016-10-25 LAB — LIPID PANEL
CHOL/HDL RATIO: 2.7 ratio (ref 0.0–5.0)
Cholesterol, Total: 107 mg/dL (ref 100–199)
HDL: 40 mg/dL (ref >39)
LDL CALC: 50 mg/dL (ref 0–99)
Triglycerides: 84 mg/dL (ref 0–149)
VLDL CHOLESTEROL CAL: 17 mg/dL (ref 5–40)

## 2016-10-25 LAB — CMP14+EGFR
ALBUMIN: 3.8 g/dL (ref 3.5–5.5)
ALT: 14 IU/L (ref 0–44)
AST: 19 IU/L (ref 0–40)
Albumin/Globulin Ratio: 1.7 (ref 1.2–2.2)
Alkaline Phosphatase: 85 IU/L (ref 39–117)
BUN / CREAT RATIO: 16 (ref 9–20)
BUN: 15 mg/dL (ref 6–24)
Bilirubin Total: 0.8 mg/dL (ref 0.0–1.2)
CO2: 30 mmol/L — AB (ref 18–29)
CREATININE: 0.96 mg/dL (ref 0.76–1.27)
Calcium: 8.3 mg/dL — ABNORMAL LOW (ref 8.7–10.2)
Chloride: 100 mmol/L (ref 96–106)
GFR calc non Af Amer: 88 mL/min/{1.73_m2} (ref >59)
GFR, EST AFRICAN AMERICAN: 102 mL/min/{1.73_m2} (ref >59)
GLUCOSE: 93 mg/dL (ref 65–99)
Globulin, Total: 2.2 g/dL (ref 1.5–4.5)
Potassium: 4 mmol/L (ref 3.5–5.2)
Sodium: 141 mmol/L (ref 134–144)
TOTAL PROTEIN: 6 g/dL (ref 6.0–8.5)

## 2016-10-25 NOTE — Progress Notes (Signed)
Pt given Testosterone inj Tolerated well 

## 2016-10-26 MED ORDER — TESTOSTERONE CYPIONATE 200 MG/ML IM SOLN
200.0000 mg | INTRAMUSCULAR | Status: DC
  Administered 2016-10-25 – 2017-06-30 (×17): 200 mg via INTRAMUSCULAR

## 2016-10-26 NOTE — Addendum Note (Signed)
Addended by: Bearl Mulberry on: 10/26/2016 11:19 AM   Modules accepted: Orders

## 2016-11-08 ENCOUNTER — Ambulatory Visit: Payer: Federal, State, Local not specified - PPO

## 2016-11-09 ENCOUNTER — Ambulatory Visit (INDEPENDENT_AMBULATORY_CARE_PROVIDER_SITE_OTHER): Payer: Federal, State, Local not specified - PPO | Admitting: *Deleted

## 2016-11-09 DIAGNOSIS — E349 Endocrine disorder, unspecified: Secondary | ICD-10-CM | POA: Diagnosis not present

## 2016-11-09 NOTE — Progress Notes (Signed)
Testosterone inj given Pt tolerated well 

## 2016-11-15 ENCOUNTER — Other Ambulatory Visit: Payer: Self-pay | Admitting: Family Medicine

## 2016-11-23 ENCOUNTER — Ambulatory Visit: Payer: Federal, State, Local not specified - PPO

## 2016-11-24 ENCOUNTER — Ambulatory Visit (INDEPENDENT_AMBULATORY_CARE_PROVIDER_SITE_OTHER): Payer: Federal, State, Local not specified - PPO | Admitting: *Deleted

## 2016-11-24 DIAGNOSIS — E349 Endocrine disorder, unspecified: Secondary | ICD-10-CM

## 2016-11-24 NOTE — Progress Notes (Signed)
Pt given Testosterone inj Tolerated well 

## 2016-12-08 ENCOUNTER — Ambulatory Visit (INDEPENDENT_AMBULATORY_CARE_PROVIDER_SITE_OTHER): Payer: Federal, State, Local not specified - PPO | Admitting: *Deleted

## 2016-12-08 DIAGNOSIS — E349 Endocrine disorder, unspecified: Secondary | ICD-10-CM

## 2016-12-08 NOTE — Progress Notes (Signed)
Pt given Testosterone inj Tolerated well 

## 2016-12-15 ENCOUNTER — Other Ambulatory Visit: Payer: Self-pay | Admitting: Family Medicine

## 2016-12-22 ENCOUNTER — Ambulatory Visit (INDEPENDENT_AMBULATORY_CARE_PROVIDER_SITE_OTHER): Payer: Federal, State, Local not specified - PPO | Admitting: *Deleted

## 2016-12-22 DIAGNOSIS — E349 Endocrine disorder, unspecified: Secondary | ICD-10-CM

## 2016-12-22 NOTE — Progress Notes (Signed)
Pt given testosterone inj Tolerated well 

## 2016-12-29 DIAGNOSIS — K080 Exfoliation of teeth due to systemic causes: Secondary | ICD-10-CM | POA: Diagnosis not present

## 2017-01-05 ENCOUNTER — Ambulatory Visit (INDEPENDENT_AMBULATORY_CARE_PROVIDER_SITE_OTHER): Payer: Federal, State, Local not specified - PPO | Admitting: *Deleted

## 2017-01-05 DIAGNOSIS — E349 Endocrine disorder, unspecified: Secondary | ICD-10-CM

## 2017-01-05 NOTE — Progress Notes (Signed)
Pt given Testosterone inj Tolerated well 

## 2017-01-10 ENCOUNTER — Other Ambulatory Visit: Payer: Self-pay | Admitting: Family Medicine

## 2017-01-11 IMAGING — CR DG CERVICAL SPINE COMPLETE 4+V
5 series · 5 of 5 positions shown · non-contrast
Comparison: None.

CLINICAL DATA: Neck pain for 1 month

EXAM:
CERVICAL SPINE - COMPLETE 4+ VIEW

[view not recorded (1 of 5)]
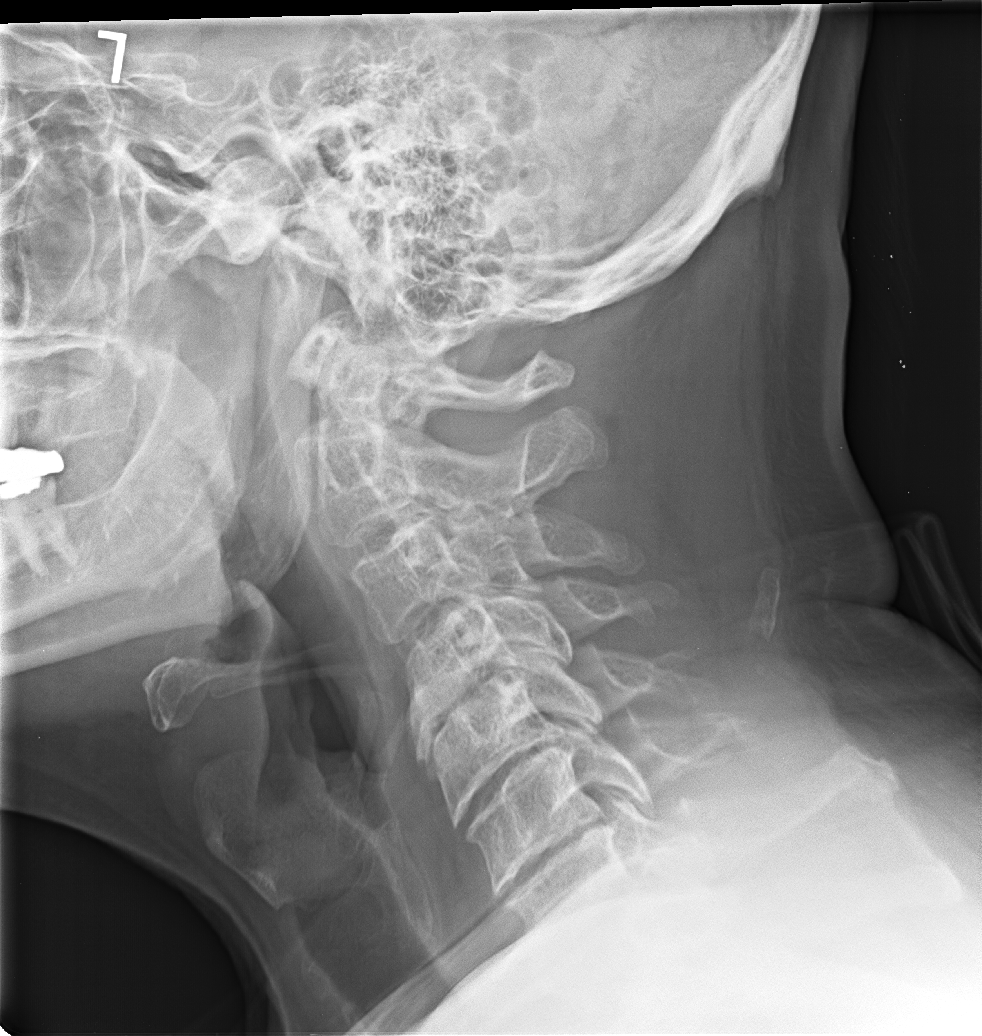

[view not recorded (2 of 5)]
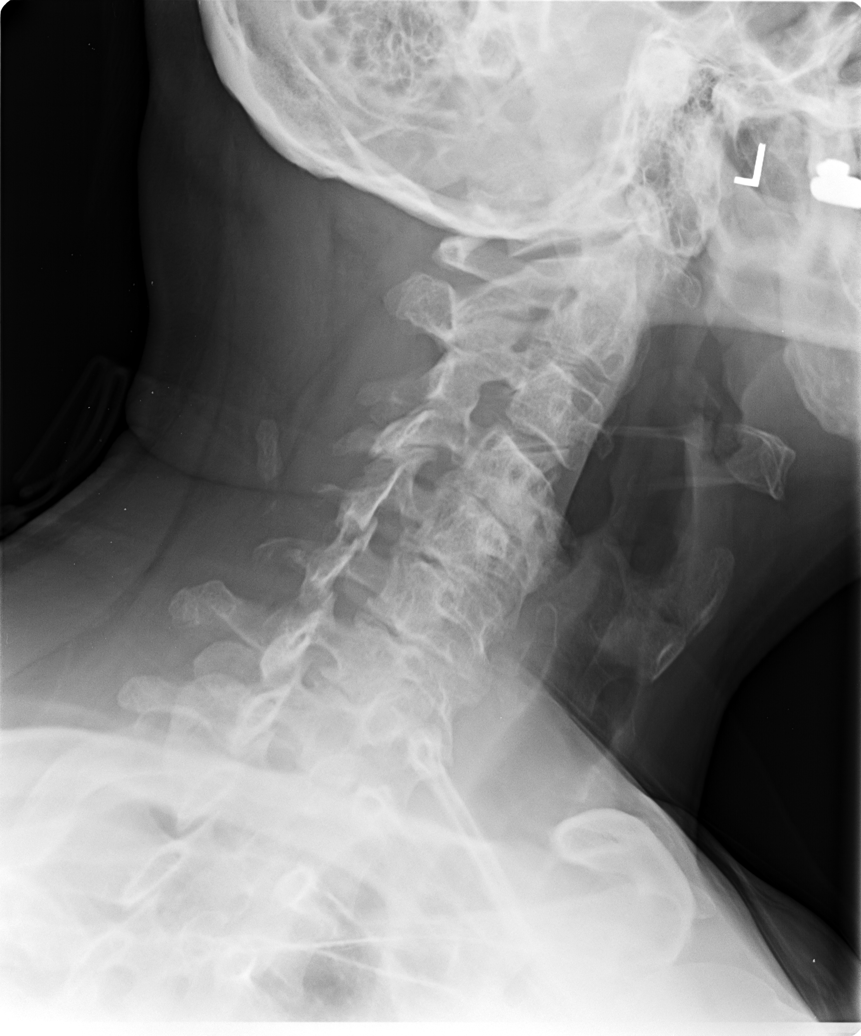

[view not recorded (3 of 5)]
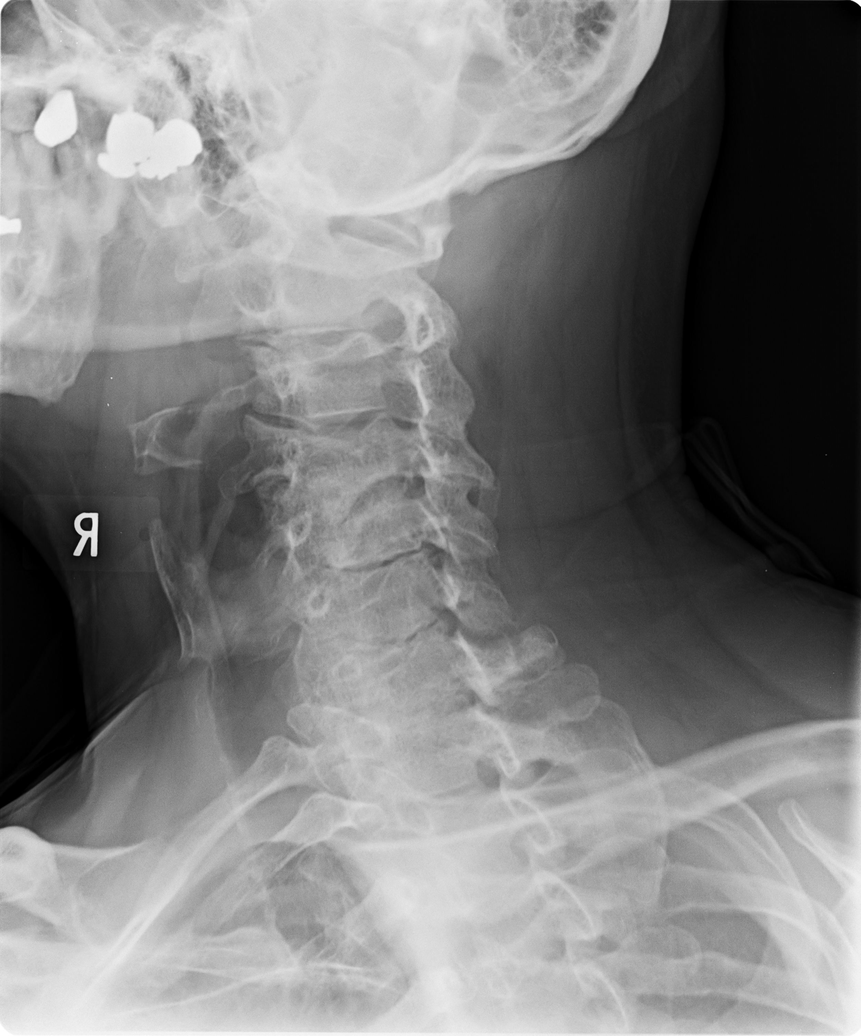

[view not recorded (4 of 5)]
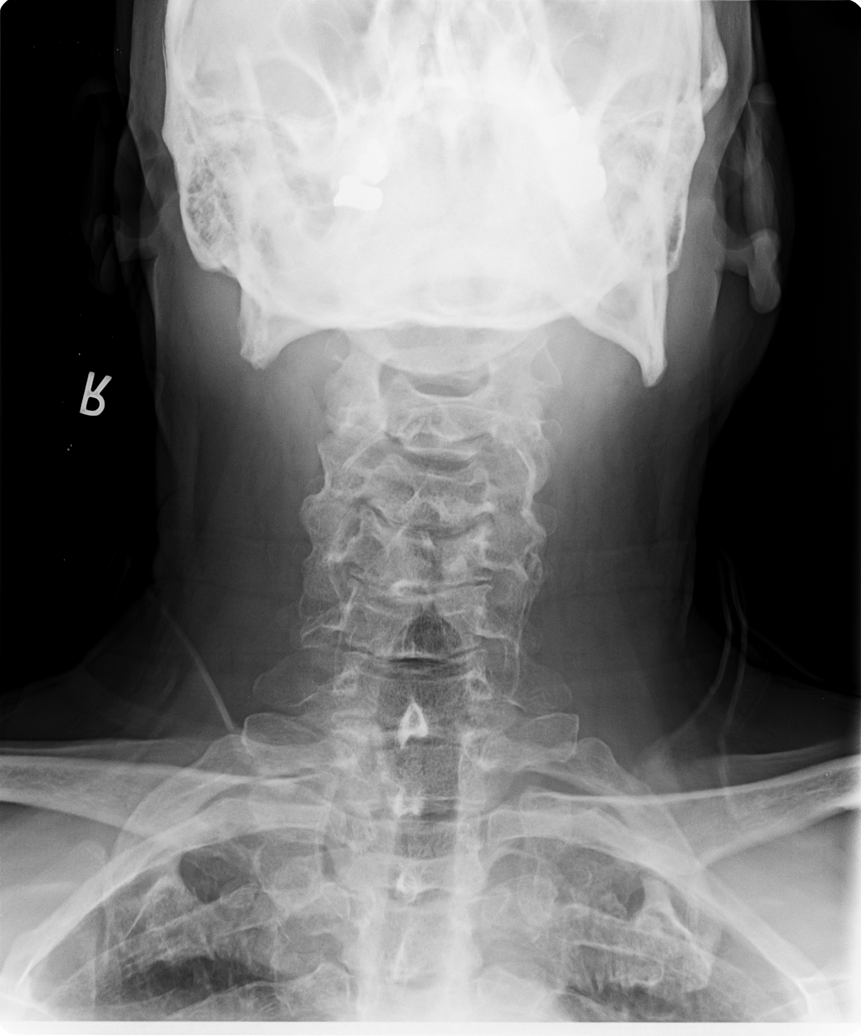

[view not recorded (5 of 5)]
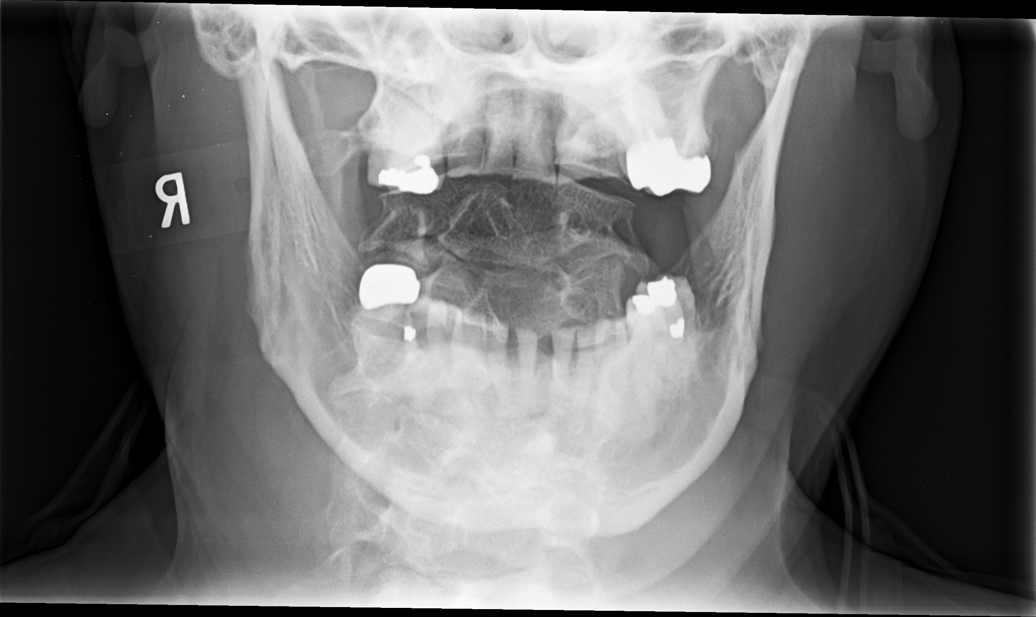

[5 of 5 positions shown; findings below may reference images not displayed]

FINDINGS: There is no evidence of cervical spine fracture or prevertebral soft
tissue swelling. Alignment is normal. There are degenerative joint
changes of cervical spine with narrowed joint space and osteophyte
formation from C4 through C7. There are narrowing of the right C2-3,
C4-5, C5-6, C7-T1, left C3-4, C4-5, C5-6, C6-7 neural foramina due
to osteophyte encroachment.
IMPRESSION: Degenerative joint changes of cervical spine as described.

## 2017-01-13 ENCOUNTER — Other Ambulatory Visit: Payer: Self-pay | Admitting: Family Medicine

## 2017-01-13 DIAGNOSIS — K080 Exfoliation of teeth due to systemic causes: Secondary | ICD-10-CM | POA: Diagnosis not present

## 2017-01-17 ENCOUNTER — Encounter: Payer: Self-pay | Admitting: Family Medicine

## 2017-01-17 ENCOUNTER — Ambulatory Visit (INDEPENDENT_AMBULATORY_CARE_PROVIDER_SITE_OTHER): Payer: Federal, State, Local not specified - PPO | Admitting: Family Medicine

## 2017-01-17 VITALS — BP 100/59 | HR 59 | Temp 97.6°F | Ht 69.0 in | Wt 211.0 lb

## 2017-01-17 DIAGNOSIS — F1120 Opioid dependence, uncomplicated: Secondary | ICD-10-CM | POA: Diagnosis not present

## 2017-01-17 DIAGNOSIS — M48061 Spinal stenosis, lumbar region without neurogenic claudication: Secondary | ICD-10-CM

## 2017-01-17 DIAGNOSIS — E782 Mixed hyperlipidemia: Secondary | ICD-10-CM

## 2017-01-17 DIAGNOSIS — E291 Testicular hypofunction: Secondary | ICD-10-CM

## 2017-01-17 MED ORDER — BUPRENORPHINE 10 MCG/HR TD PTWK: MEDICATED_PATCH | TRANSDERMAL | 2 refills | Status: DC

## 2017-01-17 NOTE — Progress Notes (Signed)
Subjective:  Patient ID: Cesar Leon, male    DOB: [birth year]  Age: 56 y.o. MRN: [id number]  CC: Medication Refill (pt here today for refills. No other concerns voiced.)   HPI Cesar Leon presents for Follow-up of chronic pain. His pain is under good control at 3-5/10 based on activity level. If he is active for an extended time with strenuous labor he'll have a 5. Baseline tends to be about 3. He never lets his medicine wear off since he is using the patch. This is applied weekly. He denies any side effects including constipation and sedation.   Follow up for testosterone deficiency: Pt. Using medication as directed. Denies any sx referrable to DVT such as edema or erythema of legs. No dyspnea or chest pain. Energy level reported as being good. Libido is normal and denies E.D. Feels strength is adequate and improved from baseline.   Depression screen [facility] 2/9 01/17/2017 10/24/2016 07/18/2016  Decreased Interest 0 0 0  Down, Depressed, Hopeless 0 0 0  PHQ - 2 Score 0 0 0    History Cesar Leon has a past medical history of Allergic rhinitis; Chronic pain; DDD (degenerative disc disease) (12/28/08); Femur fracture, right ([place]); GERD (gastroesophageal reflux disease); Hematuria; History of GI bleed; Hyperlipidemia; Hypogonadism male; Hypothyroidism; Internal hemorrhoid; and PVD (peripheral vascular disease) ([place]).   He has a past surgical history that includes Carpal tunnel release (8/11) and right ankle  (1976).   His family history includes Anxiety disorder in his father; Depression in his father; Diabetes in his brother and father; Lung cancer in his father; Peripheral vascular disease in his father.He reports that he has quit smoking. He has never used smokeless tobacco. He reports that he drinks alcohol. He reports that he does not use drugs.    ROS Review of Systems  Constitutional: Negative for chills, diaphoresis, fever and unexpected weight change.  HENT: Negative for  congestion, hearing loss, rhinorrhea and sore throat.   Eyes: Negative for visual disturbance.  Respiratory: Negative for cough and shortness of breath.   Cardiovascular: Negative for chest pain.  Gastrointestinal: Negative for abdominal pain, constipation and diarrhea.  Genitourinary: Negative for dysuria and flank pain.  Musculoskeletal: Negative for arthralgias and joint swelling.  Skin: Negative for rash.  Neurological: Negative for dizziness and headaches.  Psychiatric/Behavioral: Negative for dysphoric mood and sleep disturbance.    Objective:  BP (!) 100/59   Pulse (!) 59   Temp 97.6 F (36.4 C) (Oral)   Ht _0  (1.753 m)   Wt 211 lb (95.7 kg)   BMI 31.16 kg/m   BP Readings from Last 3 Encounters:  01/17/17 (!) 100/59  10/24/16 134/71  07/18/16 127/71    Wt Readings from Last 3 Encounters:  01/17/17 211 lb (95.7 kg)  10/24/16 215 lb (97.5 kg)  07/18/16 240 lb (108.9 kg)     Physical Exam  Constitutional: He is oriented to person, place, and time. He appears well-developed and well-nourished. No distress.  HENT:  Head: Normocephalic and atraumatic.  Right Ear: External ear normal.  Left Ear: External ear normal.  Nose: Nose normal.  Mouth/Throat: Oropharynx is clear and moist.  Eyes: Pupils are equal, round, and reactive to light. Conjunctivae and EOM are normal.  Neck: Normal range of motion. Neck supple. No thyromegaly present.  Cardiovascular: Normal rate, regular rhythm and normal heart sounds.   No murmur heard. Pulmonary/Chest: Effort normal and breath sounds normal. No respiratory distress. He has no wheezes. He has  no rales.  Abdominal: Soft. Bowel sounds are normal. He exhibits no distension. There is no tenderness.  Lymphadenopathy:    He has no cervical adenopathy.  Neurological: He is alert and oriented to person, place, and time. He has normal reflexes.  Skin: Skin is warm and dry.  Psychiatric: He has a normal mood and affect. His behavior is  normal. Judgment and thought content normal.      Assessment & Plan:   Cesar Leon was seen today for medication refill.  Diagnoses and all orders for this visit:  Hypogonadism male -     CBC with Differential/Platelet -     CMP14+EGFR -     Testosterone,Free and Total  Mixed hyperlipidemia -     CBC with Differential/Platelet -     CMP14+EGFR -     Lipid panel  Spinal stenosis of lumbar region, unspecified whether neurogenic claudication present -     CBC with Differential/Platelet -     CMP14+EGFR -     ToxASSURE Select 13 (MW), Urine  Opioid type dependence, continuous ([facility]) -     ToxASSURE Select 13 (MW), Urine  Other orders -     buprenorphine (BUTRANS) 10 MCG/HR PTWK patch; APPLY 1 PATCH EVERY WEEK       I am having Cesar Leon maintain his Calcium Carb-Cholecalciferol (CALCIUM + D3 PO), Methylcellulose (Laxative), Multiple Vitamins-Minerals (CENTRUM SILVER PO), cetirizine, clobetasol cream, polyethylene glycol powder, omeprazole, methocarbamol, fluticasone, testosterone cypionate, atorvastatin, meloxicam, montelukast, and buprenorphine. We will continue to administer testosterone cypionate.  Allergies as of 01/17/2017      Reactions   Codeine    Penicillins       Medication List       Accurate as of 01/17/17 11:59 PM. Always use your most recent med list.          atorvastatin 40 MG tablet Commonly known as:  LIPITOR TAKE ONE (1) TABLET EACH DAY   buprenorphine 10 MCG/HR Ptwk patch Commonly known as:  BUTRANS APPLY 1 PATCH EVERY WEEK   CALCIUM + D3 PO Take by mouth.   CENTRUM SILVER PO Take 1 tablet by mouth daily.   cetirizine 10 MG tablet Commonly known as:  ZYRTEC Take 1 tablet (10 mg total) by mouth daily.   clobetasol cream 0.05 % Commonly known as:  TEMOVATE Apply 1 application topically 2 (two) times daily.   fluticasone 50 MCG/ACT nasal spray Commonly known as:  FLONASE USE 2 SPRAYS IN EACH NOSTRIL AT BEDTIME   meloxicam 15 MG  tablet Commonly known as:  MOBIC TAKE ONE (1) TABLET EACH DAY   methocarbamol 500 MG tablet Commonly known as:  ROBAXIN   Methylcellulose (Laxative) 500 MG Tabs Take 2 tablets by mouth daily.   montelukast 10 MG tablet Commonly known as:  SINGULAIR TAKE ONE TABLET DAILY AT BEDTIME   omeprazole 40 MG capsule Commonly known as:  PRILOSEC TAKE ONE (1) CAPSULE EACH DAY   polyethylene glycol powder powder Commonly known as:  GLYCOLAX/MIRALAX MIX 17 GRAMS INTO 8OZ OF WATER AND DRINK DAILY   testosterone cypionate 200 MG/ML injection Commonly known as:  DEPOTESTOSTERONE CYPIONATE Inject 1 mL (200 mg total) into the muscle every 14 (fourteen) days.        Follow-up: Return in about 3 months (around 04/19/2017).  Claretta Fraise, M.D.

## 2017-01-19 ENCOUNTER — Ambulatory Visit (INDEPENDENT_AMBULATORY_CARE_PROVIDER_SITE_OTHER): Payer: Federal, State, Local not specified - PPO | Admitting: *Deleted

## 2017-01-19 ENCOUNTER — Other Ambulatory Visit: Payer: Self-pay | Admitting: Family Medicine

## 2017-01-19 DIAGNOSIS — E349 Endocrine disorder, unspecified: Secondary | ICD-10-CM | POA: Diagnosis not present

## 2017-01-19 DIAGNOSIS — E291 Testicular hypofunction: Secondary | ICD-10-CM

## 2017-01-19 NOTE — Patient Instructions (Signed)
Testosterone injection What is this medicine? TESTOSTERONE (tes TOS ter one) is the main male hormone. It supports normal male development such as muscle growth, facial hair, and deep voice. It is used in males to treat low testosterone levels. This medicine may be used for other purposes; ask your health care provider or pharmacist if you have questions. COMMON BRAND NAME(S): Andro-L.A., Aveed, Delatestryl, Depo-Testosterone, Virilon What should I tell my health care provider before I take this medicine? They need to know if you have any of these conditions: -cancer -diabetes -heart disease -kidney disease -liver disease -lung disease -prostate disease -an unusual or allergic reaction to testosterone, other medicines, foods, dyes, or preservatives -pregnant or trying to get pregnant -breast-feeding How should I use this medicine? This medicine is for injection into a muscle. It is usually given by a health care professional in a hospital or clinic setting. Contact your pediatrician regarding the use of this medicine in children. While this medicine may be prescribed for children as young as 12 years of age for selected conditions, precautions do apply. Overdosage: If you think you have taken too much of this medicine contact a poison control center or emergency room at once. NOTE: This medicine is only for you. Do not share this medicine with others. What if I miss a dose? Try not to miss a dose. Your doctor or health care professional will tell you when your next injection is due. Notify the office if you are unable to keep an appointment. What may interact with this medicine? -medicines for diabetes -medicines that treat or prevent blood clots like warfarin -oxyphenbutazone -propranolol -steroid medicines like prednisone or cortisone This list may not describe all possible interactions. Give your health care provider a list of all the medicines, herbs, non-prescription drugs, or  dietary supplements you use. Also tell them if you smoke, drink alcohol, or use illegal drugs. Some items may interact with your medicine. What should I watch for while using this medicine? Visit your doctor or health care professional for regular checks on your progress. They will need to check the level of testosterone in your blood. This medicine is only approved for use in men who have low levels of testosterone related to certain medical conditions. Heart attacks and strokes have been reported with the use of this medicine. Notify your doctor or health care professional and seek emergency treatment if you develop breathing problems; changes in vision; confusion; chest pain or chest tightness; sudden arm pain; severe, sudden headache; trouble speaking or understanding; sudden numbness or weakness of the face, arm or leg; loss of balance or coordination. Talk to your doctor about the risks and benefits of this medicine. This medicine may affect blood sugar levels. If you have diabetes, check with your doctor or health care professional before you change your diet or the dose of your diabetic medicine. Testosterone injections are not commonly used in women. Women should inform their doctor if they wish to become pregnant or think they might be pregnant. There is a potential for serious side effects to an unborn child. Talk to your health care professional or pharmacist for more information. Talk with your doctor or health care professional about your birth control options while taking this medicine. This drug is banned from use in athletes by most athletic organizations. What side effects may I notice from receiving this medicine? Side effects that you should report to your doctor or health care professional as soon as possible: -allergic reactions like skin rash,   itching or hives, swelling of the face, lips, or tongue -breast enlargement -breathing problems -changes in emotions or moods -deep or  hoarse voice -irregular menstrual periods -signs and symptoms of liver injury like dark yellow or brown urine; general ill feeling or flu-like symptoms; light-colored stools; loss of appetite; nausea; right upper belly pain; unusually weak or tired; yellowing of the eyes or skin -stomach pain -swelling of the ankles, feet, hands -too frequent or persistent erections -trouble passing urine or change in the amount of urine Side effects that usually do not require medical attention (report to your doctor or health care professional if they continue or are bothersome): -acne -change in sex drive or performance -facial hair growth -hair loss -headache This list may not describe all possible side effects. Call your doctor for medical advice about side effects. You may report side effects to FDA at 1-800-FDA-1088. Where should I keep my medicine? Keep out of the reach of children. This medicine can be abused. Keep your medicine in a safe place to protect it from theft. Do not share this medicine with anyone. Selling or giving away this medicine is dangerous and against the law. Store at room temperature between 20 and 25 degrees C (68 and 77 degrees F). Do not freeze. Protect from light. Follow the directions for the product you are prescribed. Throw away any unused medicine after the expiration date. NOTE: This sheet is a summary. It may not cover all possible information. If you have questions about this medicine, talk to your doctor, pharmacist, or health care provider.  2018 Elsevier/Gold Standard (2015-07-18 07:33:55)  

## 2017-01-19 NOTE — Progress Notes (Signed)
Patient is here today for a testosterone injection. Testosterone given and patient tolerated well.

## 2017-01-21 ENCOUNTER — Other Ambulatory Visit: Payer: Federal, State, Local not specified - PPO

## 2017-01-21 DIAGNOSIS — E291 Testicular hypofunction: Secondary | ICD-10-CM | POA: Diagnosis not present

## 2017-01-21 DIAGNOSIS — M48061 Spinal stenosis, lumbar region without neurogenic claudication: Secondary | ICD-10-CM

## 2017-01-21 DIAGNOSIS — K219 Gastro-esophageal reflux disease without esophagitis: Secondary | ICD-10-CM | POA: Diagnosis not present

## 2017-01-21 DIAGNOSIS — E785 Hyperlipidemia, unspecified: Secondary | ICD-10-CM

## 2017-01-21 LAB — TOXASSURE SELECT 13 (MW), URINE

## 2017-01-22 LAB — CBC WITH DIFFERENTIAL/PLATELET
BASOS: 1 %
Basophils Absolute: 0 10*3/uL (ref 0.0–0.2)
EOS (ABSOLUTE): 0.1 10*3/uL (ref 0.0–0.4)
EOS: 3 %
HEMATOCRIT: 48.4 % (ref 37.5–51.0)
HEMOGLOBIN: 15.8 g/dL (ref 13.0–17.7)
Immature Grans (Abs): 0 10*3/uL (ref 0.0–0.1)
Immature Granulocytes: 0 %
LYMPHS ABS: 1.6 10*3/uL (ref 0.7–3.1)
Lymphs: 37 %
MCH: 29.2 pg (ref 26.6–33.0)
MCHC: 32.6 g/dL (ref 31.5–35.7)
MCV: 89 fL (ref 79–97)
MONOCYTES: 10 %
MONOS ABS: 0.4 10*3/uL (ref 0.1–0.9)
Neutrophils Absolute: 2.2 10*3/uL (ref 1.4–7.0)
Neutrophils: 49 %
Platelets: 200 10*3/uL (ref 150–379)
RBC: 5.42 x10E6/uL (ref 4.14–5.80)
RDW: 13.6 % (ref 12.3–15.4)
WBC: 4.4 10*3/uL (ref 3.4–10.8)

## 2017-01-22 LAB — CMP14+EGFR
ALK PHOS: 86 IU/L (ref 39–117)
ALT: 11 IU/L (ref 0–44)
AST: 15 IU/L (ref 0–40)
Albumin/Globulin Ratio: 2.2 (ref 1.2–2.2)
Albumin: 4.1 g/dL (ref 3.5–5.5)
BUN/Creatinine Ratio: 16 (ref 9–20)
BUN: 16 mg/dL (ref 6–24)
Bilirubin Total: 0.9 mg/dL (ref 0.0–1.2)
CALCIUM: 8.7 mg/dL (ref 8.7–10.2)
CHLORIDE: 102 mmol/L (ref 96–106)
CO2: 27 mmol/L (ref 20–29)
CREATININE: 0.97 mg/dL (ref 0.76–1.27)
GFR calc Af Amer: 100 mL/min/{1.73_m2} (ref >59)
GFR, EST NON AFRICAN AMERICAN: 87 mL/min/{1.73_m2} (ref >59)
GLOBULIN, TOTAL: 1.9 g/dL (ref 1.5–4.5)
Glucose: 93 mg/dL (ref 65–99)
Potassium: 4.1 mmol/L (ref 3.5–5.2)
SODIUM: 141 mmol/L (ref 134–144)
Total Protein: 6 g/dL (ref 6.0–8.5)

## 2017-01-22 LAB — LIPID PANEL
CHOLESTEROL TOTAL: 97 mg/dL — AB (ref 100–199)
Chol/HDL Ratio: 2.4 ratio (ref 0.0–5.0)
HDL: 40 mg/dL (ref >39)
LDL Calculated: 46 mg/dL (ref 0–99)
TRIGLYCERIDES: 53 mg/dL (ref 0–149)
VLDL CHOLESTEROL CAL: 11 mg/dL (ref 5–40)

## 2017-01-22 LAB — TESTOSTERONE,FREE AND TOTAL
TESTOSTERONE FREE: 22.2 pg/mL (ref 7.2–24.0)
TESTOSTERONE: 965 ng/dL — AB (ref 264–916)

## 2017-02-01 ENCOUNTER — Other Ambulatory Visit: Payer: Self-pay | Admitting: Family Medicine

## 2017-02-02 ENCOUNTER — Ambulatory Visit (INDEPENDENT_AMBULATORY_CARE_PROVIDER_SITE_OTHER): Payer: Federal, State, Local not specified - PPO | Admitting: *Deleted

## 2017-02-02 DIAGNOSIS — E349 Endocrine disorder, unspecified: Secondary | ICD-10-CM

## 2017-02-02 NOTE — Progress Notes (Signed)
Pt given Depotestosterone inj Tolerated well

## 2017-02-02 NOTE — Telephone Encounter (Signed)
Last seen 01/17/17  Dr Darlyn ReadStacks  If approved route to nurse to call into  [facility]

## 2017-02-16 ENCOUNTER — Ambulatory Visit (INDEPENDENT_AMBULATORY_CARE_PROVIDER_SITE_OTHER): Payer: Federal, State, Local not specified - PPO | Admitting: *Deleted

## 2017-02-16 ENCOUNTER — Other Ambulatory Visit: Payer: Self-pay | Admitting: Family Medicine

## 2017-02-16 DIAGNOSIS — E349 Endocrine disorder, unspecified: Secondary | ICD-10-CM

## 2017-02-16 NOTE — Progress Notes (Signed)
Pt given Testosterone inj Pt tolerated well 

## 2017-03-02 ENCOUNTER — Ambulatory Visit (INDEPENDENT_AMBULATORY_CARE_PROVIDER_SITE_OTHER): Payer: Federal, State, Local not specified - PPO | Admitting: *Deleted

## 2017-03-02 DIAGNOSIS — E349 Endocrine disorder, unspecified: Secondary | ICD-10-CM

## 2017-03-02 NOTE — Progress Notes (Signed)
Pt given testosterone inj Tolerated well 

## 2017-03-16 ENCOUNTER — Ambulatory Visit (INDEPENDENT_AMBULATORY_CARE_PROVIDER_SITE_OTHER): Payer: Federal, State, Local not specified - PPO | Admitting: *Deleted

## 2017-03-16 ENCOUNTER — Other Ambulatory Visit: Payer: Self-pay | Admitting: Family Medicine

## 2017-03-16 DIAGNOSIS — E349 Endocrine disorder, unspecified: Secondary | ICD-10-CM

## 2017-03-16 NOTE — Progress Notes (Signed)
Pt given Testosterone inj Tolerated well 

## 2017-03-30 ENCOUNTER — Ambulatory Visit (INDEPENDENT_AMBULATORY_CARE_PROVIDER_SITE_OTHER): Payer: Federal, State, Local not specified - PPO | Admitting: *Deleted

## 2017-03-30 DIAGNOSIS — E349 Endocrine disorder, unspecified: Secondary | ICD-10-CM | POA: Diagnosis not present

## 2017-03-30 DIAGNOSIS — Z23 Encounter for immunization: Secondary | ICD-10-CM | POA: Diagnosis not present

## 2017-03-30 NOTE — Progress Notes (Signed)
Pt given testosterone inj Tolerated well 

## 2017-04-14 ENCOUNTER — Other Ambulatory Visit: Payer: Self-pay | Admitting: Family Medicine

## 2017-04-21 ENCOUNTER — Encounter: Payer: Self-pay | Admitting: Family Medicine

## 2017-04-21 ENCOUNTER — Ambulatory Visit: Payer: Federal, State, Local not specified - PPO | Admitting: Family Medicine

## 2017-04-21 ENCOUNTER — Ambulatory Visit (INDEPENDENT_AMBULATORY_CARE_PROVIDER_SITE_OTHER): Payer: Federal, State, Local not specified - PPO | Admitting: Family Medicine

## 2017-04-21 VITALS — BP 105/60 | HR 60 | Temp 97.4°F | Ht 69.0 in | Wt 207.0 lb

## 2017-04-21 DIAGNOSIS — K219 Gastro-esophageal reflux disease without esophagitis: Secondary | ICD-10-CM | POA: Diagnosis not present

## 2017-04-21 DIAGNOSIS — F1120 Opioid dependence, uncomplicated: Secondary | ICD-10-CM | POA: Diagnosis not present

## 2017-04-21 DIAGNOSIS — E349 Endocrine disorder, unspecified: Secondary | ICD-10-CM | POA: Diagnosis not present

## 2017-04-21 DIAGNOSIS — M48061 Spinal stenosis, lumbar region without neurogenic claudication: Secondary | ICD-10-CM

## 2017-04-21 DIAGNOSIS — E291 Testicular hypofunction: Secondary | ICD-10-CM | POA: Diagnosis not present

## 2017-04-21 MED ORDER — BUPRENORPHINE 10 MCG/HR TD PTWK: MEDICATED_PATCH | TRANSDERMAL | 2 refills | Status: DC

## 2017-04-21 MED ORDER — DICLOFENAC SODIUM 75 MG PO TBEC: 75.0000 mg | DELAYED_RELEASE_TABLET | Freq: Two times a day (BID) | ORAL | 2 refills | Status: DC

## 2017-04-21 MED ORDER — ATORVASTATIN CALCIUM 40 MG PO TABS: ORAL_TABLET | ORAL | 1 refills | Status: DC

## 2017-04-21 MED ORDER — OMEPRAZOLE 40 MG PO CPDR: DELAYED_RELEASE_CAPSULE | ORAL | 5 refills | Status: DC

## 2017-04-21 NOTE — Progress Notes (Signed)
Subjective:  Patient ID: Cesar Leon, male    DOB: [birth year]  Age: 56 y.o. MRN: [id number]  CC: Medication Refill (pt here today for refills on his pain patch and follow up of his chronic medical conditions)   HPI Cesar Leon presents for Pain assessment: Cause of pain- lumbar spondylosis Pain location- lumbar Pain on scale of 1-10- 3 Frequency-constant What increases pain-bending What makes pain Better-laying down. Frequent positionn change  Effects on ADL - none Any change in general medical condition-none  Current medications- buprenorphine Effectiveness of current meds-good Adverse reactions form pain meds-denies Morphine equivalent 18  Pill count performed-No Urine drug screen- Yes Was the NCCSR reviewed- yes  If yes were their any concerning findings? - no  Pain contract signed on:09/2015 Patient in for follow-up of GERD. Currently asymptomatic taking  PPI daily. There is no chest pain or heartburn. No hematemesis and no melena. No dysphagia or choking. Onset is remote. Progression is stable. Complicating factors, none.  Follow up for testosterone deficiency: Pt. Using medication as directed. Denies any sx referrable to DVT such as edema or erythema of legs. No dyspnea or chest pain. Energy level reported as being good. Libido is normal and denies E.D. Feels strength is adequate and improved from baseline.   Dep[facility] screen [facility] 2/9 04/21/2017 01/17/2017 10/24/2016  Decreased Interest 0 0 0  Down, Depressed, Hopeless 0 0 0  PHQ - 2 Score 0 0 0    History Cesar NeedleMichael has a past medical history of Allergic rhinitis; Chronic pain; DDD (degenerative disc disease) (12/28/08); Femur fracture, right (HCC); GERD (gastroesophageal reflux disease); Hematuria; History of GI bleed; Hyperlipidemia; Hypogonadism male; Hypothyroidism; Internal hemorrhoid; and PVD (peripheral vascular disease) (HCC).   He has a past surgical history that includes Carpal tunnel release (8/11)  and right ankle  (1976).   His family history includes Anxiety disorder in his father; Depression in his father; Diabetes in his brother and father; Lung cancer in his father; Peripheral vascular disease in his father.He reports that he has quit smoking. He has never used smokeless tobacco. He reports that he drinks alcohol. He reports that he does not use drugs.    ROS Review of Systems  Constitutional: Negative for chills, diaphoresis, fever and unexpected weight change.  HENT: Negative for congestion, hearing loss, rhinorrhea and sore throat.   Eyes: Negative for visual disturbance.  Respiratory: Negative for cough and shortness of breath.   Cardiovascular: Negative for chest pain.  Gastrointestinal: Negative for abdominal pain, constipation and diarrhea.  Genitourinary: Negative for dysuria and flank pain.  Musculoskeletal: Negative for arthralgias and joint swelling.  Skin: Negative for rash.  Neurological: Negative for dizziness and headaches.  Psychiatric/Behavioral: Negative for dysphoric mood and sleep disturbance.    Objective:  BP 105/60   Pulse 60   Temp (!) 97.4 F (36.3 C) (Oral)   Ht 5\' 9"  (1.753 m)   Wt 207 lb (93.9 kg)   BMI 30.57 kg/m   BP Readings from Last 3 Encounters:  04/21/17 105/60  01/17/17 (!) 100/59  10/24/16 134/71    Wt Readings from Last 3 Encounters:  04/21/17 207 lb (93.9 kg)  01/17/17 211 lb (95.7 kg)  10/24/16 215 lb (97.5 kg)     Physical Exam  Constitutional: He is oriented to person, place, and time. He appears well-developed and well-nourished. No distress.  HENT:  Head: Normocephalic.  Eyes: Pupils are equal, round, and reactive to light.  Neck: Normal range of motion.  Cardiovascular:  Normal rate, regular rhythm and normal heart sounds.   No murmur heard. Pulmonary/Chest: Effort normal and breath sounds normal.  Abdominal: There is no tenderness.  Musculoskeletal: He exhibits tenderness (midline lumbar).  Neurological: He  is alert and oriented to person, place, and time. He has normal reflexes.  Skin: Skin is warm and dry.  Psychiatric: His behavior is normal. Thought content normal.  Vitals reviewed.     Assessment & Plan:   There are no diagnoses linked to this encounter.     I am having Cesar Leon maintain his Calcium Carb-Cholecalciferol (CALCIUM + D3 PO), Methylcellulose (Laxative), Multiple Vitamins-Minerals (CENTRUM SILVER PO), cetirizine, clobetasol cream, omeprazole, methocarbamol, atorvastatin, buprenorphine, testosterone cypionate, montelukast, polyethylene glycol powder, meloxicam, and fluticasone. We will continue to administer testosterone cypionate.  Allergies as of 04/21/2017      Reactions   Codeine    Penicillins       Medication List       Accurate as of 04/21/17  2:14 PM. Always use your most recent med list.          atorvastatin 40 MG tablet Commonly known as:  LIPITOR TAKE ONE (1) TABLET EACH DAY   buprenorphine 10 MCG/HR Ptwk patch Commonly known as:  BUTRANS APPLY 1 PATCH EVERY WEEK   CALCIUM + D3 PO Take by mouth.   CENTRUM SILVER PO Take 1 tablet by mouth daily.   cetirizine 10 MG tablet Commonly known as:  ZYRTEC Take 1 tablet (10 mg total) by mouth daily.   clobetasol cream 0.05 % Commonly known as:  TEMOVATE Apply 1 application topically 2 (two) times daily.   fluticasone 50 MCG/ACT nasal spray Commonly known as:  FLONASE USE 2 SPRAYS IN EACH NOSTRIL AT BEDTIME   meloxicam 15 MG tablet Commonly known as:  MOBIC TAKE ONE (1) TABLET EACH DAY   methocarbamol 500 MG tablet Commonly known as:  ROBAXIN   Methylcellulose (Laxative) 500 MG Tabs Take 2 tablets by mouth daily.   montelukast 10 MG tablet Commonly known as:  SINGULAIR TAKE ONE TABLET DAILY AT BEDTIME   omeprazole 40 MG capsule Commonly known as:  PRILOSEC TAKE ONE (1) CAPSULE EACH DAY   polyethylene glycol powder powder Commonly known as:  GLYCOLAX/MIRALAX MIX 17 GRAMS  INTO 8OZ OF WATER AND DRINK DAILY   testosterone cypionate 200 MG/ML injection Commonly known as:  DEPOTESTOSTERONE CYPIONATE INJECT EVERY 2 WEEKS        Follow-up: No Follow-up on file.  Mechele Claude, M.D.

## 2017-04-28 LAB — TOXASSURE SELECT 13 (MW), URINE

## 2017-05-05 ENCOUNTER — Ambulatory Visit (INDEPENDENT_AMBULATORY_CARE_PROVIDER_SITE_OTHER): Payer: Federal, State, Local not specified - PPO | Admitting: *Deleted

## 2017-05-05 DIAGNOSIS — E349 Endocrine disorder, unspecified: Secondary | ICD-10-CM

## 2017-05-05 DIAGNOSIS — E291 Testicular hypofunction: Secondary | ICD-10-CM | POA: Diagnosis not present

## 2017-05-05 NOTE — Progress Notes (Signed)
Pt given Testosterone inj Tolerated well 

## 2017-05-15 ENCOUNTER — Other Ambulatory Visit: Payer: Self-pay | Admitting: Family Medicine

## 2017-05-19 ENCOUNTER — Ambulatory Visit (INDEPENDENT_AMBULATORY_CARE_PROVIDER_SITE_OTHER): Payer: Federal, State, Local not specified - PPO | Admitting: *Deleted

## 2017-05-19 DIAGNOSIS — E349 Endocrine disorder, unspecified: Secondary | ICD-10-CM | POA: Diagnosis not present

## 2017-05-19 DIAGNOSIS — E291 Testicular hypofunction: Secondary | ICD-10-CM

## 2017-05-19 NOTE — Progress Notes (Signed)
Pt given testosterone 200mg /231ml IM RUOQ and tolerated well.

## 2017-06-02 ENCOUNTER — Ambulatory Visit (INDEPENDENT_AMBULATORY_CARE_PROVIDER_SITE_OTHER): Payer: Federal, State, Local not specified - PPO

## 2017-06-02 DIAGNOSIS — E349 Endocrine disorder, unspecified: Secondary | ICD-10-CM | POA: Diagnosis not present

## 2017-06-02 DIAGNOSIS — E291 Testicular hypofunction: Secondary | ICD-10-CM

## 2017-06-02 NOTE — Progress Notes (Signed)
Testosterone injection given to left upper outer quadrant.  Patient tolerated well. 

## 2017-06-14 ENCOUNTER — Other Ambulatory Visit: Payer: Self-pay | Admitting: Family Medicine

## 2017-06-16 ENCOUNTER — Ambulatory Visit (INDEPENDENT_AMBULATORY_CARE_PROVIDER_SITE_OTHER): Payer: Federal, State, Local not specified - PPO | Admitting: *Deleted

## 2017-06-16 DIAGNOSIS — E349 Endocrine disorder, unspecified: Secondary | ICD-10-CM | POA: Diagnosis not present

## 2017-06-16 NOTE — Progress Notes (Signed)
Pt given Testosterone inj Tolerated well 

## 2017-06-30 ENCOUNTER — Ambulatory Visit (INDEPENDENT_AMBULATORY_CARE_PROVIDER_SITE_OTHER): Payer: Federal, State, Local not specified - PPO | Admitting: *Deleted

## 2017-06-30 DIAGNOSIS — E349 Endocrine disorder, unspecified: Secondary | ICD-10-CM | POA: Diagnosis not present

## 2017-06-30 NOTE — Progress Notes (Signed)
Pt given Testosterone inj Tolerated well 

## 2017-07-03 ENCOUNTER — Ambulatory Visit: Payer: Federal, State, Local not specified - PPO

## 2017-07-11 ENCOUNTER — Other Ambulatory Visit: Payer: Self-pay | Admitting: Family Medicine

## 2017-07-14 ENCOUNTER — Ambulatory Visit (INDEPENDENT_AMBULATORY_CARE_PROVIDER_SITE_OTHER): Payer: Federal, State, Local not specified - PPO | Admitting: *Deleted

## 2017-07-14 DIAGNOSIS — E349 Endocrine disorder, unspecified: Secondary | ICD-10-CM | POA: Diagnosis not present

## 2017-07-14 MED ORDER — TESTOSTERONE CYPIONATE 100 MG/ML IM SOLN
200.0000 mg | INTRAMUSCULAR | Status: DC
  Administered 2017-07-14 – 2017-11-17 (×10): 200 mg via INTRAMUSCULAR

## 2017-07-14 NOTE — Progress Notes (Signed)
Pt given Testosterone inj Tolerated well 

## 2017-07-21 ENCOUNTER — Encounter: Payer: Self-pay | Admitting: Family Medicine

## 2017-07-21 ENCOUNTER — Ambulatory Visit: Payer: Federal, State, Local not specified - PPO | Admitting: Family Medicine

## 2017-07-21 VITALS — BP 112/68 | HR 76 | Temp 97.1°F | Ht 69.0 in | Wt 213.0 lb

## 2017-07-21 DIAGNOSIS — M48061 Spinal stenosis, lumbar region without neurogenic claudication: Secondary | ICD-10-CM

## 2017-07-21 DIAGNOSIS — K080 Exfoliation of teeth due to systemic causes: Secondary | ICD-10-CM | POA: Diagnosis not present

## 2017-07-21 DIAGNOSIS — E782 Mixed hyperlipidemia: Secondary | ICD-10-CM | POA: Diagnosis not present

## 2017-07-21 DIAGNOSIS — E349 Endocrine disorder, unspecified: Secondary | ICD-10-CM | POA: Diagnosis not present

## 2017-07-21 DIAGNOSIS — E291 Testicular hypofunction: Secondary | ICD-10-CM | POA: Diagnosis not present

## 2017-07-21 MED ORDER — BUPRENORPHINE 10 MCG/HR TD PTWK: MEDICATED_PATCH | TRANSDERMAL | 2 refills | Status: DC

## 2017-07-21 MED ORDER — ATORVASTATIN CALCIUM 40 MG PO TABS: ORAL_TABLET | ORAL | 1 refills | Status: DC

## 2017-07-21 MED ORDER — MONTELUKAST SODIUM 10 MG PO TABS: 10.0000 mg | ORAL_TABLET | Freq: Every day | ORAL | 1 refills | Status: DC

## 2017-07-21 NOTE — Progress Notes (Signed)
Subjective:  Patient ID: Cesar Leon, male    DOB: 02-22-1961  Age: 57 y.o. MRN: [id number]  CC: Medication Refill (pt here today for pain patch refills and also c/o deep cough and some sinus stuff )   HPI [name] presents for recheck of his use of chronic pain medicine.  He says that his pain baseline is 4-5/10.  He tolerates it well.  He is gotten used to it and it does not bother him much.  The pain is nonradiating it occurs primarily at the midline lower back.  He expresses concern for a knot in the left inner thigh that cramps periodically.  Based on the use of the buprenorphine patch it is very difficult virtually impossible to predict the exact morphine milligram equivalent.  Review of the PMP aware site reveals no irregularities.  Follow up for testosterone deficiency: Pt. Using medication as directed. Denies any sx referrable to DVT such as edema or erythema of legs. No dyspnea or chest pain. Energy level reported as being good. Libido is normal and denies E.D. Feels strength is adequate and improved from baseline.  Last injection was 1 week ago.     Depression screen [facility] 2/9 07/21/2017 04/21/2017 01/17/2017  Decreased Interest 0 0 0  Down, Depressed, Hopeless 0 0 0  PHQ - 2 Score 0 0 0    History [name] has a past medical history of Allergic rhinitis, Chronic pain, DDD (degenerative disc disease) (12/28/08), Femur fracture, right ([place]), GERD (gastroesophageal reflux disease), Hematuria, History of GI bleed, Hyperlipidemia, Hypogonadism male, Hypothyroidism, Internal hemorrhoid, and PVD (peripheral vascular disease) ([place]).   He has a past surgical history that includes Carpal tunnel release (8/11) and right ankle  (1976).   His family history includes Anxiety disorder in his father; Depression in his father; Diabetes in his brother and father; Lung cancer in his father; Peripheral vascular disease in his father.He reports that he has quit smoking. he has never used  smokeless tobacco. He reports that he drinks alcohol. He reports that he does not use drugs.    ROS Review of Systems  Constitutional: Negative for chills, diaphoresis, fever and unexpected weight change.  HENT: Positive for congestion. Negative for hearing loss, rhinorrhea and sore throat.   Eyes: Negative for visual disturbance.  Respiratory: Positive for cough (For about a month but getting much better). Negative for shortness of breath.   Cardiovascular: Negative for chest pain.  Gastrointestinal: Negative for abdominal pain, constipation and diarrhea.  Genitourinary: Negative for dysuria and flank pain.  Musculoskeletal: Negative for arthralgias and joint swelling.  Skin: Negative for rash.  Neurological: Negative for dizziness and headaches.  Psychiatric/Behavioral: Negative for dysphoric mood and sleep disturbance.    Objective:  BP 112/68   Pulse 76   Temp (!) 97.1 F (36.2 C) (Oral)   Ht _0  (1.753 m)   Wt 213 lb (96.6 kg)   BMI 31.45 kg/m   BP Readings from Last 3 Encounters:  07/21/17 112/68  04/21/17 105/60  01/17/17 (!) 100/59    Wt Readings from Last 3 Encounters:  07/21/17 213 lb (96.6 kg)  04/21/17 207 lb (93.9 kg)  01/17/17 211 lb (95.7 kg)     Physical Exam  Constitutional: He is oriented to person, place, and time. He appears well-developed and well-nourished. No distress.  HENT:  Head: Normocephalic and atraumatic.  Right Ear: External ear normal.  Left Ear: External ear normal.  Nose: Nose normal.  Mouth/Throat: Oropharynx is clear  and moist.  Eyes: Conjunctivae and EOM are normal. Pupils are equal, round, and reactive to light.  Neck: Normal range of motion. Neck supple. No thyromegaly present.  Cardiovascular: Normal rate, regular rhythm and normal heart sounds.  No murmur heard. Pulmonary/Chest: Effort normal and breath sounds normal. No respiratory distress. He has no wheezes. He has no rales.  Abdominal: Soft. Bowel sounds are normal.  He exhibits no distension. There is no tenderness.  Lymphadenopathy:    He has no cervical adenopathy.  Neurological: He is alert and oriented to person, place, and time. He has normal reflexes.  Skin: Skin is warm and dry.  Psychiatric: He has a normal mood and affect. His behavior is normal. Judgment and thought content normal.      Assessment & Plan:   Datron was seen today for medication refill.  Diagnoses and all orders for this visit:  Hypogonadism male -     Testosterone,Free and Total  Mixed hyperlipidemia -     CMP14+EGFR -     Lipid panel  Spinal stenosis of lumbar region, unspecified whether neurogenic claudication present -     Ambulatory referral to Neurosurgery  Other orders -     atorvastatin (LIPITOR) 40 MG tablet; TAKE ONE (1) TABLET EACH DAY -     montelukast (SINGULAIR) 10 MG tablet; Take 1 tablet (10 mg total) by mouth at bedtime. -     buprenorphine (BUTRANS) 10 MCG/HR PTWK patch; APPLY 1 PATCH EVERY WEEK       I have changed Juanda Bond. Kangas's montelukast. I am also having him maintain his Calcium Carb-Cholecalciferol (CALCIUM + D3 PO), Methylcellulose (Laxative), Multiple Vitamins-Minerals (CENTRUM SILVER PO), cetirizine, clobetasol cream, methocarbamol, omeprazole, polyethylene glycol powder, testosterone cypionate, fluticasone, diclofenac, atorvastatin, and buprenorphine. We will continue to administer testosterone cypionate.  Allergies as of 07/21/2017      Reactions   Codeine    Penicillins       Medication List        Accurate as of 07/21/17  6:04 PM. Always use your most recent med list.          atorvastatin 40 MG tablet Commonly known as:  LIPITOR TAKE ONE (1) TABLET EACH DAY   buprenorphine 10 MCG/HR Ptwk patch Commonly known as:  BUTRANS APPLY 1 PATCH EVERY WEEK   CALCIUM + D3 PO Take by mouth.   CENTRUM SILVER PO Take 1 tablet by mouth daily.   cetirizine 10 MG tablet Commonly known as:  ZYRTEC Take 1 tablet (10 mg  total) by mouth daily.   clobetasol cream 0.05 % Commonly known as:  TEMOVATE Apply 1 application topically 2 (two) times daily.   diclofenac 75 MG EC tablet Commonly known as:  VOLTAREN TAKE ONE TABLET BY MOUTH TWICE DAILY   fluticasone 50 MCG/ACT nasal spray Commonly known as:  FLONASE USE 2 SPRAYS IN EACH NOSTRIL AT BEDTIME   methocarbamol 500 MG tablet Commonly known as:  ROBAXIN   Methylcellulose (Laxative) 500 MG Tabs Take 2 tablets by mouth daily.   montelukast 10 MG tablet Commonly known as:  SINGULAIR Take 1 tablet (10 mg total) by mouth at bedtime.   omeprazole 40 MG capsule Commonly known as:  PRILOSEC TAKE ONE (1) CAPSULE EACH DAY   polyethylene glycol powder powder Commonly known as:  GLYCOLAX/MIRALAX MIX 17 GRAMS INTO 8OZ OF WATER AND DRINK DAILY   testosterone cypionate 200 MG/ML injection Commonly known as:  DEPOTESTOSTERONE CYPIONATE INJECT 1ML EVERY 2 WEEKS  Patient reassured regarding the cough and congestion.  His exam appears benign.  He will let me know if symptoms take a turn for the worse.  Follow-up: Return in about 3 months (around 10/19/2017).  Claretta Fraise, M.D.

## 2017-07-22 LAB — CMP14+EGFR
A/G RATIO: 2.1 (ref 1.2–2.2)
ALT: 17 IU/L (ref 0–44)
AST: 16 IU/L (ref 0–40)
Albumin: 4.1 g/dL (ref 3.5–5.5)
Alkaline Phosphatase: 78 IU/L (ref 39–117)
BILIRUBIN TOTAL: 0.8 mg/dL (ref 0.0–1.2)
BUN/Creatinine Ratio: 15 (ref 9–20)
BUN: 14 mg/dL (ref 6–24)
CHLORIDE: 101 mmol/L (ref 96–106)
CO2: 26 mmol/L (ref 20–29)
Calcium: 8.8 mg/dL (ref 8.7–10.2)
Creatinine, Ser: 0.96 mg/dL (ref 0.76–1.27)
GFR calc non Af Amer: 87 mL/min/{1.73_m2} (ref >59)
GFR, EST AFRICAN AMERICAN: 101 mL/min/{1.73_m2} (ref >59)
GLOBULIN, TOTAL: 2 g/dL (ref 1.5–4.5)
Glucose: 105 mg/dL — ABNORMAL HIGH (ref 65–99)
POTASSIUM: 4.2 mmol/L (ref 3.5–5.2)
SODIUM: 141 mmol/L (ref 134–144)
TOTAL PROTEIN: 6.1 g/dL (ref 6.0–8.5)

## 2017-07-22 LAB — LIPID PANEL
CHOLESTEROL TOTAL: 111 mg/dL (ref 100–199)
Chol/HDL Ratio: 3 ratio (ref 0.0–5.0)
HDL: 37 mg/dL — ABNORMAL LOW (ref >39)
LDL CALC: 60 mg/dL (ref 0–99)
Triglycerides: 72 mg/dL (ref 0–149)
VLDL Cholesterol Cal: 14 mg/dL (ref 5–40)

## 2017-07-22 LAB — TESTOSTERONE,FREE AND TOTAL
Testosterone, Free: 20.1 pg/mL (ref 7.2–24.0)
Testosterone: 736 ng/dL (ref 264–916)

## 2017-07-24 ENCOUNTER — Ambulatory Visit: Payer: Federal, State, Local not specified - PPO | Admitting: Family Medicine

## 2017-07-28 ENCOUNTER — Ambulatory Visit (INDEPENDENT_AMBULATORY_CARE_PROVIDER_SITE_OTHER): Payer: Federal, State, Local not specified - PPO | Admitting: *Deleted

## 2017-07-28 DIAGNOSIS — E349 Endocrine disorder, unspecified: Secondary | ICD-10-CM

## 2017-07-28 NOTE — Progress Notes (Signed)
Pt given Testosterone inj Tolerated well 

## 2017-08-11 ENCOUNTER — Ambulatory Visit (INDEPENDENT_AMBULATORY_CARE_PROVIDER_SITE_OTHER): Payer: Federal, State, Local not specified - PPO | Admitting: *Deleted

## 2017-08-11 DIAGNOSIS — E349 Endocrine disorder, unspecified: Secondary | ICD-10-CM | POA: Diagnosis not present

## 2017-08-11 NOTE — Progress Notes (Signed)
Pt given Testosterone inj Tolerated well 

## 2017-08-14 DIAGNOSIS — M5410 Radiculopathy, site unspecified: Secondary | ICD-10-CM | POA: Diagnosis not present

## 2017-08-14 DIAGNOSIS — M5136 Other intervertebral disc degeneration, lumbar region: Secondary | ICD-10-CM | POA: Diagnosis not present

## 2017-08-14 DIAGNOSIS — M545 Low back pain: Secondary | ICD-10-CM | POA: Diagnosis not present

## 2017-08-14 DIAGNOSIS — M4716 Other spondylosis with myelopathy, lumbar region: Secondary | ICD-10-CM | POA: Diagnosis not present

## 2017-08-17 ENCOUNTER — Other Ambulatory Visit: Payer: Self-pay | Admitting: Orthopaedic Surgery

## 2017-08-17 DIAGNOSIS — M4716 Other spondylosis with myelopathy, lumbar region: Secondary | ICD-10-CM

## 2017-08-21 ENCOUNTER — Encounter: Payer: Self-pay | Admitting: Physical Therapy

## 2017-08-21 ENCOUNTER — Ambulatory Visit: Payer: Federal, State, Local not specified - PPO | Attending: Orthopedic Surgery | Admitting: Physical Therapy

## 2017-08-21 ENCOUNTER — Other Ambulatory Visit: Payer: Self-pay

## 2017-08-21 DIAGNOSIS — M5442 Lumbago with sciatica, left side: Secondary | ICD-10-CM | POA: Insufficient documentation

## 2017-08-21 DIAGNOSIS — G8929 Other chronic pain: Secondary | ICD-10-CM | POA: Insufficient documentation
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
## 2017-08-22 ENCOUNTER — Ambulatory Visit: Payer: Federal, State, Local not specified - PPO | Admitting: Physical Therapy

## 2017-08-25 ENCOUNTER — Ambulatory Visit (INDEPENDENT_AMBULATORY_CARE_PROVIDER_SITE_OTHER): Payer: Federal, State, Local not specified - PPO | Admitting: *Deleted

## 2017-08-25 DIAGNOSIS — E349 Endocrine disorder, unspecified: Secondary | ICD-10-CM

## 2017-08-25 NOTE — Progress Notes (Signed)
Pt given Testosterone inj Tolerated well 

## 2017-08-28 ENCOUNTER — Ambulatory Visit: Payer: Federal, State, Local not specified - PPO | Attending: Orthopedic Surgery | Admitting: Physical Therapy

## 2017-08-28 ENCOUNTER — Encounter: Payer: Self-pay | Admitting: Physical Therapy

## 2017-08-28 DIAGNOSIS — G8929 Other chronic pain: Secondary | ICD-10-CM | POA: Insufficient documentation

## 2017-08-28 DIAGNOSIS — M5442 Lumbago with sciatica, left side: Secondary | ICD-10-CM | POA: Insufficient documentation

## 2017-08-28 NOTE — Therapy (Signed)
[facility] [address] [place], [place], [address] Phone: [phone]   Fax:  [phone]  Physical Therapy Treatment  Patient Details  Name: Cesar Leon MRN: [id number] Date of Birth: 1960-11-11 Referring Provider: Sharolyn DouglasMax Cohen MD.   Encounter Date: 08/28/2017  PT End of Session - 08/28/17 0743    Visit Number  2    Number of Visits  12    Date for PT Re-Evaluation  10/02/17    PT Start Time  0731    PT Stop Time  0818    PT Time Calculation (min)  47 min    Activity Tolerance  Patient tolerated treatment well    Behavior During Therapy  [facility] for tasks assessed/performed       Past Medical History:  Diagnosis Date  . Allergic rhinitis   . Chronic pain   . DDD (degenerative disc disease) 12/28/08  . Femur fracture, right (HCC)   . GERD (gastroesophageal reflux disease)   . Hematuria   . History of GI bleed   . Hyperlipidemia   . Hypogonadism male   . Hypothyroidism   . Internal hemorrhoid   . PVD (peripheral vascular disease) (HCC)     Past Surgical History:  Procedure Laterality Date  . CARPAL TUNNEL RELEASE  8/11  . right ankle   1976    There were no vitals filed for this visit.  Subjective Assessment - 08/28/17 0742    Subjective  Reports he is soreness upon arrival. Reports he had cramps in LLE last night but has pain down RLE with walking a lot.    Pertinent History  DDD; right femoral fracture; CTS; right ankle surgery.    Limitations  Walking    How long can you walk comfortably?  Can walk throughout the day but his pain increases.    Diagnostic tests  XBM(8413RI(2017) and another scheduled for next month.  Please look under "imaging tab" for results.    Patient Stated Goals  Decrease pain and move better.    Currently in Pain?  Yes    Pain Score  4  "hard to tell" due to pain patch donned    Pain Location  Back    Pain Descriptors / Indicators  Sore    Pain Type  Chronic pain    Pain Radiating Towards  R knee     Pain Onset  More than a month ago         [facility] PT Assessment - 08/28/17 0001      Assessment   Medical Diagnosis  Spondylosis with myelopathy, lumbar region.    Next MD Visit  09/2017      Precautions   Precautions  None      Restrictions   Weight Bearing Restrictions  No                  [facility] Adult PT Treatment/Exercise - 08/28/17 0001      Exercises   Exercises  Lumbar      Lumbar Exercises: Stretches   Single Knee to Chest Stretch  Right;3 reps;30 seconds      Lumbar Exercises: Aerobic   Nustep  L4 x12 min      Lumbar Exercises: Supine   Ab Set  10 reps;5 seconds    Clam  15 reps;2 seconds    Bent Knee Raise  15 reps;2 seconds    Bridge  5 reps 3-4/10    Isometric Hip Flexion  10 reps;3 seconds;5 seconds  RLE into wall for pelvic alignment    Large Ball Abdominal Isometric  10 reps;5 seconds for pelvic alignment      Modalities   Modalities  Electrical Stimulation;Moist Heat      Moist Heat Therapy   Number Minutes Moist Heat  15 Minutes    Moist Heat Location  Lumbar Spine      Electrical Stimulation   Electrical Stimulation Location  B low back    Electrical Stimulation Action  IFC    Electrical Stimulation Parameters  80-150 hz x15 min    Electrical Stimulation Goals  Pain                  PT Long Term Goals - 08/28/17 0746      PT LONG TERM GOAL #1   Title  Independent with a HEP.    Time  6    Period  Weeks    Status  Achieved      PT LONG TERM GOAL #2   Title  Patient achieve active lumbar extension to 15 degrees.    Time  6    Period  Weeks    Status  On-going      PT LONG TERM GOAL #3   Title  Walk a community distance with pain not > 4/10.    Time  6    Period  Weeks    Status  On-going      PT LONG TERM GOAL #4   Title  Eliminate right LE symptoms.    Time  6    Period  Weeks    Status  On-going      PT LONG TERM GOAL #5   Title  Perform ADL's with pain not > 4/10.    Time  6    Period  Weeks     Status  On-going            Plan - 08/28/17 0809    Clinical Impression Statement  Patient presented in clinic with reports of low back soreness. Reports that his job requires sitting but also getting up and down a lot. Pelvic alignment correction exercises such as R SKTC, hip squeeze isometric as well as hip flexion isometric to correct R anterior pelvic rotation. Bridges attempted with few reps secondary to 3-4/10 low back soreness per patient report. Core activation exercises initiated today to assist with building of core strength with VCs and demo for proper technique. Patient encouraged to continue HEP even with patient reporting compliance with HEP since evaluation. Normal modalities response noted following removal of the modalities.    Rehab Potential  Good    Clinical Impairments Affecting Rehab Potential  Chronicity.    PT Frequency  2x / week    PT Duration  6 weeks    PT Treatment/Interventions  ADLs/Self Care Home Management;Electrical Stimulation;Moist Heat;Ultrasound;Traction;Therapeutic exercise;Therapeutic activities    PT Next Visit Plan  Right single knee to chest; draw-in-progression; hip bridges; standing extension. Core exercise progression.  Modalites.    Consulted and Agree with Plan of Care  Patient       Patient will benefit from skilled therapeutic intervention in order to improve the following deficits and impairments:  Abnormal gait, Decreased activity tolerance, Decreased mobility, Decreased range of motion, Decreased strength, Postural dysfunction, Pain  Visit Diagnosis: Chronic midline low back pain with left-sided sciatica     Problem List Patient Active Problem List   Diagnosis Date Noted  . Elevated fasting glucose 09/28/2015  .  Pain medication agreement completed 09/28/2015  . Opioid type dependence, continuous (HCC) 09/28/2015  . Gastroesophageal reflux disease without esophagitis 08/05/2015  . Spinal stenosis of lumbar region 04/29/2015  .  Hypogonadism male 09/22/2014  . Hyperlipemia 09/22/2014    Marvell Fuller, PTA 08/28/2017, 8:22 AM  [facility] [address] [place], [place], [address] Phone: [phone]   Fax:  [phone]  Name: KARIM AIELLO MRN: [id number] Date of Birth: 1960-09-14

## 2017-09-04 ENCOUNTER — Ambulatory Visit: Payer: Federal, State, Local not specified - PPO | Admitting: Physical Therapy

## 2017-09-04 DIAGNOSIS — M5442 Lumbago with sciatica, left side: Principal | ICD-10-CM

## 2017-09-04 DIAGNOSIS — G8929 Other chronic pain: Secondary | ICD-10-CM | POA: Diagnosis not present

## 2017-09-04 NOTE — Therapy (Signed)
[facility] [address] [place], [place], [address] Phone: [phone]   Fax:  [phone]  Physical Therapy Treatment  Patient Details  Name: Cesar Leon MRN: [id number] Date of Birth: October 24, 1960 Referring Provider: Sharolyn DouglasMax Cohen MD.   Encounter Date: 09/04/2017  PT End of Session - 09/04/17 0751    Visit Number  3    Number of Visits  12    Date for PT Re-Evaluation  10/02/17    PT Start Time  0732    PT Stop Time  0822    PT Time Calculation (min)  50 min    Activity Tolerance  Patient tolerated treatment well    Behavior During Therapy  [facility] for tasks assessed/performed       Past Medical History:  Diagnosis Date  . Allergic rhinitis   . Chronic pain   . DDD (degenerative disc disease) 12/28/08  . Femur fracture, right (HCC)   . GERD (gastroesophageal reflux disease)   . Hematuria   . History of GI bleed   . Hyperlipidemia   . Hypogonadism male   . Hypothyroidism   . Internal hemorrhoid   . PVD (peripheral vascular disease) (HCC)     Past Surgical History:  Procedure Laterality Date  . CARPAL TUNNEL RELEASE  8/11  . right ankle   1976    There were no vitals filed for this visit.  Subjective Assessment - 09/04/17 0752    Subjective  Patient reported back is feeling sore; 3-4/10 pain with pain going down R LE.    Pertinent History  DDD; right femoral fracture; CTS; right ankle surgery.    Limitations  Walking    How long can you walk comfortably?  Can walk throughout the day but his pain increases.    Diagnostic tests  XLK(4401RI(2017) and another scheduled for next month.  Please look under "imaging tab" for results.    Patient Stated Goals  Decrease pain and move better.    Currently in Pain?  Yes    Pain Score  4     Pain Location  Back    Pain Orientation  Mid    Pain Descriptors / Indicators  Sore    Pain Type  Chronic pain    Pain Onset  More than a month ago         [facility] PT Assessment - 09/04/17 0001       Assessment   Medical Diagnosis  Spondylosis with myelopathy, lumbar region.    Next MD Visit  09/2017      Precautions   Precautions  None      Restrictions   Weight Bearing Restrictions  No                  [facility] Adult PT Treatment/Exercise - 09/04/17 0001      Exercises   Exercises  Lumbar      Lumbar Exercises: Aerobic   Nustep  L4 x15 min      Lumbar Exercises: Supine   Ab Set  15 reps;5 seconds    Clam  20 reps;Other (comment) red theraband    Bent Knee Raise  15 reps    Other Supine Lumbar Exercises  supine ball squeeze, 5 second hold x15 with draw in      Modalities   Modalities  Electrical Stimulation;Moist Heat      Moist Heat Therapy   Number Minutes Moist Heat  15 Minutes    Moist Heat Location  Lumbar  Spine      Electrical Stimulation   Electrical Stimulation Location  B low back    Electrical Stimulation Action  IFC    Electrical Stimulation Parameters  80-150 hz x15    Electrical Stimulation Goals  Pain                  PT Long Term Goals - 08/28/17 0746      PT LONG TERM GOAL #1   Title  Independent with a HEP.    Time  6    Period  Weeks    Status  Achieved      PT LONG TERM GOAL #2   Title  Patient achieve active lumbar extension to 15 degrees.    Time  6    Period  Weeks    Status  On-going      PT LONG TERM GOAL #3   Title  Walk a community distance with pain not > 4/10.    Time  6    Period  Weeks    Status  On-going      PT LONG TERM GOAL #4   Title  Eliminate right LE symptoms.    Time  6    Period  Weeks    Status  On-going      PT LONG TERM GOAL #5   Title  Perform ADL's with pain not > 4/10.    Time  6    Period  Weeks    Status  On-going            Plan - 09/04/17 1610    Clinical Impression Statement  Patient was able to complete treatment well with no reports of increased pain. Patient noted just "the same soreness" with exercises. No adverse effects noted upon removal of modalities.     Clinical Presentation  Evolving    Clinical Decision Making  Moderate    Rehab Potential  Good    Clinical Impairments Affecting Rehab Potential  Chronicity.    PT Frequency  2x / week    PT Duration  6 weeks    PT Treatment/Interventions  ADLs/Self Care Home Management;Electrical Stimulation;Moist Heat;Ultrasound;Traction;Therapeutic exercise;Therapeutic activities    PT Next Visit Plan  Right single knee to chest; draw-in-progression; hip bridges; standing extension. Core exercise progression.  Modalites.    Consulted and Agree with Plan of Care  Patient       Patient will benefit from skilled therapeutic intervention in order to improve the following deficits and impairments:  Abnormal gait, Decreased activity tolerance, Decreased mobility, Decreased range of motion, Decreased strength, Postural dysfunction, Pain  Visit Diagnosis: Chronic midline low back pain with left-sided sciatica     Problem List Patient Active Problem List   Diagnosis Date Noted  . Elevated fasting glucose 09/28/2015  . Pain medication agreement completed 09/28/2015  . Opioid type dependence, continuous (HCC) 09/28/2015  . Gastroesophageal reflux disease without esophagitis 08/05/2015  . Spinal stenosis of lumbar region 04/29/2015  . Hypogonadism male 09/22/2014  . Hyperlipemia 09/22/2014    Guss Bunde, PT, DPT 09/04/2017, 8:20 AM  [facility] [address] [place], [place], [address] Phone: [phone]   Fax:  [phone]  Name: Cesar Leon MRN: [id number] Date of Birth: 28-Aug-1960

## 2017-09-07 DIAGNOSIS — H60501 Unspecified acute noninfective otitis externa, right ear: Secondary | ICD-10-CM | POA: Diagnosis not present

## 2017-09-08 ENCOUNTER — Ambulatory Visit (INDEPENDENT_AMBULATORY_CARE_PROVIDER_SITE_OTHER): Payer: Federal, State, Local not specified - PPO | Admitting: *Deleted

## 2017-09-08 DIAGNOSIS — E349 Endocrine disorder, unspecified: Secondary | ICD-10-CM | POA: Diagnosis not present

## 2017-09-08 NOTE — Progress Notes (Signed)
Pt given testosterone inj tolerated well 

## 2017-09-11 ENCOUNTER — Other Ambulatory Visit: Payer: Self-pay | Admitting: Family Medicine

## 2017-09-12 ENCOUNTER — Encounter: Payer: Self-pay | Admitting: Physical Therapy

## 2017-09-12 ENCOUNTER — Ambulatory Visit: Payer: Federal, State, Local not specified - PPO | Admitting: Physical Therapy

## 2017-09-12 DIAGNOSIS — M5442 Lumbago with sciatica, left side: Secondary | ICD-10-CM | POA: Diagnosis not present

## 2017-09-12 DIAGNOSIS — G8929 Other chronic pain: Secondary | ICD-10-CM

## 2017-09-12 NOTE — Therapy (Signed)
[facility] [address] [place], [place], [address] Phone: [phone]   Fax:  [phone]  Physical Therapy Treatment  Patient Details  Name: Cesar Leon MRN: [id number] Date of Birth: 1960/12/07 Referring Provider: Sharolyn Douglas MD.   Encounter Date: 09/12/2017  PT End of Session - 09/12/17 0736    Visit Number  4    Number of Visits  12    Date for PT Re-Evaluation  10/02/17    PT Start Time  0731    PT Stop Time  0823    PT Time Calculation (min)  52 min    Activity Tolerance  Patient tolerated treatment well    Behavior During Therapy  [facility] for tasks assessed/performed       Past Medical History:  Diagnosis Date  . Allergic rhinitis   . Chronic pain   . DDD (degenerative disc disease) 12/28/08  . Femur fracture, right (HCC)   . GERD (gastroesophageal reflux disease)   . Hematuria   . History of GI bleed   . Hyperlipidemia   . Hypogonadism male   . Hypothyroidism   . Internal hemorrhoid   . PVD (peripheral vascular disease) (HCC)     Past Surgical History:  Procedure Laterality Date  . CARPAL TUNNEL RELEASE  8/11  . right ankle   1976    There were no vitals filed for this visit.  Subjective Assessment - 09/12/17 0734    Subjective  Reports that his back is very sore and he works 6 days a week.    Pertinent History  DDD; right femoral fracture; CTS; right ankle surgery.    Limitations  Walking    How long can you walk comfortably?  Can walk throughout the day but his pain increases.    Diagnostic tests  UXL(2440) and another scheduled for next month.  Please look under "imaging tab" for results.    Patient Stated Goals  Decrease pain and move better.    Currently in Pain?  Yes    Pain Score  4     Pain Location  Back    Pain Orientation  Mid    Pain Descriptors / Indicators  Sore    Pain Type  Chronic pain    Pain Onset  More than a month ago    Pain Frequency  Constant         [facility] PT Assessment -  09/12/17 0001      Assessment   Medical Diagnosis  Spondylosis with myelopathy, lumbar region.    Next MD Visit  09/2017      Precautions   Precautions  None      Restrictions   Weight Bearing Restrictions  No                  [facility] Adult PT Treatment/Exercise - 09/12/17 0001      Lumbar Exercises: Aerobic   Nustep  L4 x16 min      Lumbar Exercises: Standing   Other Standing Lumbar Exercises  Lat pulldown pink XTS x20 reps with core VCs      Lumbar Exercises: Supine   Bent Knee Raise  15 reps    Isometric Hip Flexion  10 reps;5 seconds RLE for pelvic correction    Other Supine Lumbar Exercises  Supine ball squeeze x10 reps with 5 sec hold for pelvic correction      Modalities   Modalities  Traction      Traction   Type of  Traction  Lumbar    Min (lbs)  5    Max (lbs)  85    Hold Time  99    Rest Time  5    Time  15                  PT Long Term Goals - 08/28/17 0746      PT LONG TERM GOAL #1   Title  Independent with a HEP.    Time  6    Period  Weeks    Status  Achieved      PT LONG TERM GOAL #2   Title  Patient achieve active lumbar extension to 15 degrees.    Time  6    Period  Weeks    Status  On-going      PT LONG TERM GOAL #3   Title  Walk a community distance with pain not > 4/10.    Time  6    Period  Weeks    Status  On-going      PT LONG TERM GOAL #4   Title  Eliminate right LE symptoms.    Time  6    Period  Weeks    Status  On-going      PT LONG TERM GOAL #5   Title  Perform ADL's with pain not > 4/10.    Time  6    Period  Weeks    Status  On-going            Plan - 09/12/17 0808    Clinical Impression Statement  Patient tolerated today's treatment fairly well as he arrived to treatment with low back soreness. Patient arrived with pain descending to posterior R knee region especially with standing. Pelvic correction exercises completed as well as more core strengthening exercises without complaint of  pain. Patient very stiff and slow with all transition movements per patient report. Traction initiated per POC and per symptoms as well at 85#. Patient educated to monitor and assess his response to traction following today's session. Patient reported soreness following end of traction session.    Rehab Potential  Good    Clinical Impairments Affecting Rehab Potential  Chronicity.    PT Frequency  2x / week    PT Duration  6 weeks    PT Treatment/Interventions  ADLs/Self Care Home Management;Electrical Stimulation;Moist Heat;Ultrasound;Traction;Therapeutic exercise;Therapeutic activities    PT Next Visit Plan  Right single knee to chest; draw-in-progression; hip bridges; standing extension. Core exercise progression.  Modalites.    Consulted and Agree with Plan of Care  Patient       Patient will benefit from skilled therapeutic intervention in order to improve the following deficits and impairments:  Abnormal gait, Decreased activity tolerance, Decreased mobility, Decreased range of motion, Decreased strength, Postural dysfunction, Pain  Visit Diagnosis: Chronic midline low back pain with left-sided sciatica     Problem List Patient Active Problem List   Diagnosis Date Noted  . Elevated fasting glucose 09/28/2015  . Pain medication agreement completed 09/28/2015  . Opioid type dependence, continuous (HCC) 09/28/2015  . Gastroesophageal reflux disease without esophagitis 08/05/2015  . Spinal stenosis of lumbar region 04/29/2015  . Hypogonadism male 09/22/2014  . Hyperlipemia 09/22/2014    Marvell FullerKelsey P Cassondra Stachowski, PTA 09/12/2017, 8:26 AM  [facility] [address] [place], [place], [address] Phone: [phone]   Fax:  [phone]  Name: Cesar Leon MRN: [id number] Date of Birth: 29-Feb-1961

## 2017-09-13 NOTE — Telephone Encounter (Signed)
Patient aware he will need to be seen for refill on patches

## 2017-09-19 ENCOUNTER — Ambulatory Visit
Admission: RE | Admit: 2017-09-19 | Discharge: 2017-09-19 | Disposition: A | Discharge status: Home / Self Care | Payer: Federal, State, Local not specified - PPO | Source: Ambulatory Visit | Attending: Orthopaedic Surgery | Admitting: Orthopaedic Surgery

## 2017-09-19 DIAGNOSIS — M48061 Spinal stenosis, lumbar region without neurogenic claudication: Secondary | ICD-10-CM | POA: Diagnosis not present

## 2017-09-19 DIAGNOSIS — M4716 Other spondylosis with myelopathy, lumbar region: Secondary | ICD-10-CM

## 2017-09-20 ENCOUNTER — Encounter: Payer: Self-pay | Admitting: Physical Therapy

## 2017-09-20 ENCOUNTER — Ambulatory Visit: Payer: Federal, State, Local not specified - PPO | Admitting: Physical Therapy

## 2017-09-20 DIAGNOSIS — M5442 Lumbago with sciatica, left side: Principal | ICD-10-CM

## 2017-09-20 DIAGNOSIS — G8929 Other chronic pain: Secondary | ICD-10-CM

## 2017-09-20 NOTE — Therapy (Signed)
[facility] [address] [place], [place], [address] Phone: [phone]   Fax:  [phone]  Physical Therapy Treatment  Patient Details  Name: Cesar Leon MRN: [id number] Date of Birth: 20-Dec-1960 Referring Provider: Sharolyn Douglas MD.   Encounter Date: 09/20/2017  PT End of Session - 09/20/17 0737    Visit Number  5    Number of Visits  12    Date for PT Re-Evaluation  10/02/17    PT Start Time  0737    PT Stop Time  0819    PT Time Calculation (min)  42 min    Activity Tolerance  Patient limited by pain    Behavior During Therapy  [facility] for tasks assessed/performed       Past Medical History:  Diagnosis Date  . Allergic rhinitis   . Chronic pain   . DDD (degenerative disc disease) 12/28/08  . Femur fracture, right (HCC)   . GERD (gastroesophageal reflux disease)   . Hematuria   . History of GI bleed   . Hyperlipidemia   . Hypogonadism male   . Hypothyroidism   . Internal hemorrhoid   . PVD (peripheral vascular disease) (HCC)     Past Surgical History:  Procedure Laterality Date  . CARPAL TUNNEL RELEASE  8/11  . right ankle   1976    There were no vitals filed for this visit.  Subjective Assessment - 09/20/17 0736    Subjective  Reports that he has had increased R LBP since traction was completed. Reports constant R LBP and RLE.    Pertinent History  DDD; right femoral fracture; CTS; right ankle surgery.    Limitations  Walking    How long can you walk comfortably?  Can walk throughout the day but his pain increases.    Diagnostic tests  ONG(2952) and another scheduled for next month.  Please look under "imaging tab" for results.    Patient Stated Goals  Decrease pain and move better.    Currently in Pain?  Yes    Pain Score  6     Pain Location  Back    Pain Orientation  Right;Lower    Pain Descriptors / Indicators  Discomfort;Constant    Pain Type  Chronic pain    Pain Radiating Towards  to R Knee    Pain  Onset  More than a month ago    Pain Frequency  Constant         [facility] PT Assessment - 09/20/17 0001      Assessment   Medical Diagnosis  Spondylosis with myelopathy, lumbar region.    Next MD Visit  09/2017      Precautions   Precautions  None      Restrictions   Weight Bearing Restrictions  No            No data recorded       [facility] Adult PT Treatment/Exercise - 09/20/17 0001      Modalities   Modalities  Electrical Stimulation;Moist Heat;Ultrasound      Moist Heat Therapy   Number Minutes Moist Heat  15 Minutes    Moist Heat Location  Lumbar Spine      Electrical Stimulation   Electrical Stimulation Location  R lumbar paraspinals    Electrical Stimulation Action  Pre-Mod    Electrical Stimulation Parameters  80-150 hz x15 min    Electrical Stimulation Goals  Pain;Tone      Ultrasound   Ultrasound Location  R lumbar paraspinals    Ultrasound Parameters  1.5 w/cm2, 100%, 1 mhz x10 min    Ultrasound Goals  Pain      Manual Therapy   Manual Therapy  Soft tissue mobilization    Soft tissue mobilization  STW/M to R lumbar paraspinals and QL to reduce muscle guarding and pain                  PT Long Term Goals - 08/28/17 0746      PT LONG TERM GOAL #1   Title  Independent with a HEP.    Time  6    Period  Weeks    Status  Achieved      PT LONG TERM GOAL #2   Title  Patient achieve active lumbar extension to 15 degrees.    Time  6    Period  Weeks    Status  On-going      PT LONG TERM GOAL #3   Title  Walk a community distance with pain not > 4/10.    Time  6    Period  Weeks    Status  On-going      PT LONG TERM GOAL #4   Title  Eliminate right LE symptoms.    Time  6    Period  Weeks    Status  On-going      PT LONG TERM GOAL #5   Title  Perform ADL's with pain not > 4/10.    Time  6    Period  Weeks    Status  On-going            Plan - 09/20/17 [id number]    Clinical Impression Statement  Patient presented in clinic  with reports of pain since traction session. Patient has been experiencing R LBP and radiation of pain down to R knee. Patient able to tolerate manual therapy to R lumbar paraspinals which presented with muscle guarding secondary to pain with reports of only soreness. Normal modalities response noted following removal of the modalities. Goals remain on-going secondary to pain.    Rehab Potential  Good    Clinical Impairments Affecting Rehab Potential  Chronicity.    PT Frequency  2x / week    PT Duration  6 weeks    PT Treatment/Interventions  ADLs/Self Care Home Management;Electrical Stimulation;Moist Heat;Ultrasound;Traction;Therapeutic exercise;Therapeutic activities;Manual techniques Manual techniques added to POC per MPT cosign    PT Next Visit Plan  Right single knee to chest; draw-in-progression; hip bridges; standing extension. Core exercise progression.  Modalites.    Consulted and Agree with Plan of Care  Patient       Patient will benefit from skilled therapeutic intervention in order to improve the following deficits and impairments:  Abnormal gait, Decreased activity tolerance, Decreased mobility, Decreased range of motion, Decreased strength, Postural dysfunction, Pain  Visit Diagnosis: Chronic midline low back pain with left-sided sciatica     Problem List Patient Active Problem List   Diagnosis Date Noted  . Elevated fasting glucose 09/28/2015  . Pain medication agreement completed 09/28/2015  . Opioid type dependence, continuous (HCC) 09/28/2015  . Gastroesophageal reflux disease without esophagitis 08/05/2015  . Spinal stenosis of lumbar region 04/29/2015  . Hypogonadism male 09/22/2014  . Hyperlipemia 09/22/2014    Marvell FullerKelsey P Warren Kugelman, PTA 09/20/2017, 8:52 AM  [facility] [address] [place], [place], [address] Phone: [phone]   Fax:  [phone]  Name: Cesar AldoMichael D Leon MRN: [id number]  Date of Birth:  1961-01-08

## 2017-09-22 ENCOUNTER — Ambulatory Visit (INDEPENDENT_AMBULATORY_CARE_PROVIDER_SITE_OTHER): Payer: Federal, State, Local not specified - PPO | Admitting: *Deleted

## 2017-09-22 DIAGNOSIS — E349 Endocrine disorder, unspecified: Secondary | ICD-10-CM

## 2017-09-22 NOTE — Progress Notes (Signed)
Pt given Testosterone inj Tolerated well 

## 2017-09-27 ENCOUNTER — Encounter: Payer: Self-pay | Admitting: Physical Therapy

## 2017-09-27 ENCOUNTER — Ambulatory Visit: Payer: Federal, State, Local not specified - PPO | Attending: Orthopedic Surgery | Admitting: Physical Therapy

## 2017-09-27 DIAGNOSIS — G8929 Other chronic pain: Secondary | ICD-10-CM | POA: Diagnosis not present

## 2017-09-27 DIAGNOSIS — M5442 Lumbago with sciatica, left side: Secondary | ICD-10-CM | POA: Insufficient documentation

## 2017-09-27 NOTE — Therapy (Signed)
[facility] [address] [place], [place], [address] Phone: [phone]   Fax:  [phone]  Physical Therapy Treatment  Patient Details  Name: Cesar Leon MRN: [id number] Date of Birth: 1961-01-25 Referring Provider: Sharolyn Douglas MD.   Encounter Date: 09/27/2017  PT End of Session - 09/27/17 0745    Visit Number  6    Number of Visits  12    Date for PT Re-Evaluation  10/02/17    PT Start Time  0734    PT Stop Time  0826    PT Time Calculation (min)  52 min    Activity Tolerance  Patient tolerated treatment well    Behavior During Therapy  [facility] for tasks assessed/performed       Past Medical History:  Diagnosis Date  . Allergic rhinitis   . Chronic pain   . DDD (degenerative disc disease) 12/28/08  . Femur fracture, right (HCC)   . GERD (gastroesophageal reflux disease)   . Hematuria   . History of GI bleed   . Hyperlipidemia   . Hypogonadism male   . Hypothyroidism   . Internal hemorrhoid   . PVD (peripheral vascular disease) (HCC)     Past Surgical History:  Procedure Laterality Date  . CARPAL TUNNEL RELEASE  8/11  . right ankle   1976    There were no vitals filed for this visit.  Subjective Assessment - 09/27/17 0744    Subjective  Reports that his back is better from last week.    Pertinent History  DDD; right femoral fracture; CTS; right ankle surgery.    Limitations  Walking    How long can you walk comfortably?  Can walk throughout the day but his pain increases.    Diagnostic tests  ONG(2952) and another scheduled for next month.  Please look under "imaging tab" for results.    Patient Stated Goals  Decrease pain and move better.    Currently in Pain?  Yes    Pain Score  5     Pain Location  Back    Pain Orientation  Right;Lower    Pain Descriptors / Indicators  Discomfort    Pain Type  Chronic pain    Pain Radiating Towards  to R knee    Pain Onset  More than a month ago    Pain Frequency  Constant          [facility] PT Assessment - 09/27/17 0001      Assessment   Medical Diagnosis  Spondylosis with myelopathy, lumbar region.    Next MD Visit  10/05/2017      Precautions   Precautions  None      Restrictions   Weight Bearing Restrictions  No      ROM / Strength   AROM / PROM / Strength  AROM      AROM   Overall AROM   Deficits    AROM Assessment Site  Lumbar    Lumbar Extension  3                   [facility] Adult PT Treatment/Exercise - 09/27/17 0001      Lumbar Exercises: Stretches   Single Knee to Chest Stretch  Right;3 reps;30 seconds      Lumbar Exercises: Aerobic   Nustep  L4 x15 min      Lumbar Exercises: Supine   Clam  20 reps red theraband    Bent Knee Raise  20 reps;2  seconds    Isometric Hip Flexion  15 reps;5 seconds RLE into wall    Other Supine Lumbar Exercises  Supine ball squeeze x15 reps with 5 sec hold for pelvic correction      Modalities   Modalities  Electrical Stimulation;Moist Heat      Moist Heat Therapy   Number Minutes Moist Heat  15 Minutes    Moist Heat Location  Lumbar Spine      Electrical Stimulation   Electrical Stimulation Location  B low back    Electrical Stimulation Action  Pre-Mod    Electrical Stimulation Parameters  80-150 hz x15 min    Electrical Stimulation Goals  Pain;Tone                  PT Long Term Goals - 09/27/17 0750      PT LONG TERM GOAL #1   Title  Independent with a HEP.    Time  6    Period  Weeks    Status  Achieved      PT LONG TERM GOAL #2   Title  Patient achieve active lumbar extension to 15 degrees.    Time  6    Period  Weeks    Status  On-going 4 deg of ext 09/27/2017      PT LONG TERM GOAL #3   Title  Walk a community distance with pain not > 4/10.    Time  6    Period  Weeks    Status  On-going      PT LONG TERM GOAL #4   Title  Eliminate right LE symptoms.    Time  6    Period  Weeks    Status  On-going      PT LONG TERM GOAL #5   Title  Perform ADL's with  pain not > 4/10.    Time  6    Period  Weeks    Status  On-going            Plan - 09/27/17 0818    Clinical Impression Statement  Patient tolerated today's treatment fairly well as he still experiencing LBP and radiation down to R knee. Patient limited with stiffness as well. Goal progression limited secondary to pain with prolonged ambulation and standing as well as lumbar extension. Pelvic correction and stability exercises as well as core strengthening exercises completed today with no complaint of any increased pain. Normal modalities response noted following removal of the modalities.    Rehab Potential  Good    Clinical Impairments Affecting Rehab Potential  Chronicity.    PT Frequency  2x / week    PT Duration  6 weeks    PT Treatment/Interventions  ADLs/Self Care Home Management;Electrical Stimulation;Moist Heat;Ultrasound;Traction;Therapeutic exercise;Therapeutic activities;Manual techniques    PT Next Visit Plan  Continue or D/C per MD discretion.    Consulted and Agree with Plan of Care  Patient       Patient will benefit from skilled therapeutic intervention in order to improve the following deficits and impairments:  Abnormal gait, Decreased activity tolerance, Decreased mobility, Decreased range of motion, Decreased strength, Postural dysfunction, Pain  Visit Diagnosis: Chronic midline low back pain with left-sided sciatica     Problem List Patient Active Problem List   Diagnosis Date Noted  . Elevated fasting glucose 09/28/2015  . Pain medication agreement completed 09/28/2015  . Opioid type dependence, continuous (HCC) 09/28/2015  . Gastroesophageal reflux disease without esophagitis 08/05/2015  . Spinal  stenosis of lumbar region 04/29/2015  . Hypogonadism male 09/22/2014  . Hyperlipemia 09/22/2014    Marvell FullerKelsey P Madelyn Tlatelpa, PTA 09/27/17 8:46 AM   [facility] [address] [place], [place], [address] Phone:  [phone]   Fax:  [phone]  Name: Cesar Leon MRN: [id number] Date of Birth: [birth year]

## 2017-10-05 DIAGNOSIS — M545 Low back pain: Secondary | ICD-10-CM | POA: Diagnosis not present

## 2017-10-05 DIAGNOSIS — M5136 Other intervertebral disc degeneration, lumbar region: Secondary | ICD-10-CM | POA: Diagnosis not present

## 2017-10-06 ENCOUNTER — Ambulatory Visit (INDEPENDENT_AMBULATORY_CARE_PROVIDER_SITE_OTHER): Payer: Federal, State, Local not specified - PPO | Admitting: *Deleted

## 2017-10-06 DIAGNOSIS — E349 Endocrine disorder, unspecified: Secondary | ICD-10-CM | POA: Diagnosis not present

## 2017-10-06 NOTE — Progress Notes (Signed)
Testosterone inj given Pt tolerated well 

## 2017-10-11 ENCOUNTER — Other Ambulatory Visit: Payer: Self-pay | Admitting: Family Medicine

## 2017-10-12 ENCOUNTER — Ambulatory Visit: Payer: Federal, State, Local not specified - PPO | Admitting: Family Medicine

## 2017-10-12 ENCOUNTER — Encounter: Payer: Self-pay | Admitting: Family Medicine

## 2017-10-12 VITALS — BP 102/70 | HR 73 | Temp 96.8°F | Ht 69.0 in | Wt 209.0 lb

## 2017-10-12 DIAGNOSIS — M48061 Spinal stenosis, lumbar region without neurogenic claudication: Secondary | ICD-10-CM

## 2017-10-12 DIAGNOSIS — E291 Testicular hypofunction: Secondary | ICD-10-CM | POA: Diagnosis not present

## 2017-10-12 DIAGNOSIS — F1120 Opioid dependence, uncomplicated: Secondary | ICD-10-CM | POA: Diagnosis not present

## 2017-10-12 MED ORDER — BUPRENORPHINE 10 MCG/HR TD PTWK: MEDICATED_PATCH | TRANSDERMAL | 2 refills | Status: DC

## 2017-10-12 NOTE — Progress Notes (Signed)
Chief Complaint  Patient presents with  . Medication Refill    pt here today for pain management    HPI  Patient presents today for pain management evaluation.  Pain is 3-4/10.  It is increased when he does a lot of walking.  Pain radiates from the lower back into the right leg.  He is planning on having an ablation procedure on May 1 that is supposed to give him tremendous relief.  He has high hopes to wean off the patch after that time/procedure.  There was some discussion about the fact that he is on a 30-day cycle with regard to his prescription but since the patches only lasts 7 days he can only get 28 days worth of medicine per month   Follow up for testosterone deficiency: Pt. Using medication as directed. Denies any sx referrable to DVT such as edema or erythema of legs. No dyspnea or chest pain. Energy level reported as being good. Libido is normal and denies E.D. Feels strength is adequate and improved from baseline. Marland Kitchen.  PMH: Smoking status noted ZOX:WRUEAVROS:Review of Systems  Constitutional: Negative.   HENT: Negative.   Eyes: Negative for visual disturbance.  Respiratory: Negative for cough and shortness of breath.   Cardiovascular: Negative for chest pain and leg swelling.  Gastrointestinal: Negative for abdominal pain, diarrhea, nausea and vomiting.  Genitourinary: Negative for difficulty urinating.  Musculoskeletal: Positive for back pain. Negative for arthralgias and myalgias.  Skin: Negative for rash.  Neurological: Negative for headaches.  Psychiatric/Behavioral: Negative for sleep disturbance.     Objective: BP 102/70   Pulse 73   Temp (!) 96.8 F (36 C) (Oral)   Ht 5\' 9"  (1.753 m)   Wt 209 lb (94.8 kg)   BMI 30.86 kg/m  Gen: NAD, alert, cooperative with exam HEENT: NCAT, EOMI, PERRL CV: RRR, good S1/S2, no murmur Resp: CTABL, no wheezes, non-labored Abd: SNTND, BS present, no guarding or organomegaly Ext: No edema, warm Neuro: Alert and oriented, No gross  deficits Results for orders placed or performed in visit on 10/12/17  ToxASSURE Select 13 (MW), Urine  Result Value Ref Range   Summary FINAL     Assessment and plan:  1. Spinal stenosis of lumbar region, unspecified whether neurogenic claudication present   2. Hypogonadism male   3. Opioid type dependence, continuous (HCC)     Meds ordered this encounter  Medications  . buprenorphine (BUTRANS) 10 MCG/HR PTWK patch    Sig: APPLY 1 PATCH EVERY WEEK    Dispense:  4 patch    Refill:  2    Orders Placed This Encounter  Procedures  . ToxASSURE Select 13 (MW), Urine    Follow up 84 days (12 weeks)  Mechele ClaudeWarren Daelyn Pettaway, MD

## 2017-10-16 ENCOUNTER — Encounter: Payer: Self-pay | Admitting: Family Medicine

## 2017-10-16 LAB — TOXASSURE SELECT 13 (MW), URINE

## 2017-10-20 ENCOUNTER — Ambulatory Visit: Payer: Federal, State, Local not specified - PPO

## 2017-10-20 ENCOUNTER — Ambulatory Visit: Payer: Federal, State, Local not specified - PPO | Admitting: Family Medicine

## 2017-10-20 ENCOUNTER — Encounter: Payer: Self-pay | Admitting: Family Medicine

## 2017-10-20 VITALS — BP 104/61 | HR 64 | Temp 97.4°F | Ht 69.0 in | Wt 213.5 lb

## 2017-10-20 DIAGNOSIS — K219 Gastro-esophageal reflux disease without esophagitis: Secondary | ICD-10-CM | POA: Diagnosis not present

## 2017-10-20 DIAGNOSIS — E782 Mixed hyperlipidemia: Secondary | ICD-10-CM | POA: Diagnosis not present

## 2017-10-20 DIAGNOSIS — E349 Endocrine disorder, unspecified: Secondary | ICD-10-CM | POA: Diagnosis not present

## 2017-10-20 DIAGNOSIS — E291 Testicular hypofunction: Secondary | ICD-10-CM | POA: Diagnosis not present

## 2017-10-20 LAB — URINALYSIS
BILIRUBIN UA: NEGATIVE
Glucose, UA: NEGATIVE
Leukocytes, UA: NEGATIVE
NITRITE UA: NEGATIVE
PH UA: 5.5 (ref 5.0–7.5)
PROTEIN UA: NEGATIVE
RBC UA: NEGATIVE
SPEC GRAV UA: 1.025 (ref 1.005–1.030)
UUROB: 0.2 mg/dL (ref 0.2–1.0)

## 2017-10-20 MED ORDER — OMEPRAZOLE 40 MG PO CPDR: DELAYED_RELEASE_CAPSULE | ORAL | 5 refills | Status: DC

## 2017-10-20 MED ORDER — ATORVASTATIN CALCIUM 40 MG PO TABS: ORAL_TABLET | ORAL | 1 refills | Status: DC

## 2017-10-20 MED ORDER — MONTELUKAST SODIUM 10 MG PO TABS: 10.0000 mg | ORAL_TABLET | Freq: Every day | ORAL | 1 refills | Status: DC

## 2017-10-20 NOTE — Progress Notes (Signed)
Subjective:  Patient ID: Cesar Leon, male    DOB: Dec 27, 1960  Age: 57 y.o. MRN: [id number]  CC: Follow-up (pt here today for follow up on his chronic medical conditions)   HPI [name] presents for patient in for follow-up of GERD. Currently asymptomatic taking  PPI daily. There is no chest pain or heartburn. No hematemesis and no melena. No dysphagia or choking. Onset is remote. Progression is stable. Complicating factors, none.  Follow up for testosterone deficiency: Pt. Using medication as directed. Denies any sx referrable to DVT such as edema or erythema of legs. No dyspnea or chest pain. Energy level reported as being good. Libido is normal and denies E.D. Feels strength is adequate and improved from baseline. Patient in for follow-up of elevated cholesterol. Doing well without complaints on current medication. Denies side effects of statin including myalgia and arthralgia and nausea. Also in today for liver function testing. Currently no chest pain, shortness of breath or other cardiovascular related symptoms noted.  Allergy symptoms under good control with cetirizine and Singulair.  We discussed his results from his toxassure test.  He denies side effects such as constipation and anxiety from his pain meds.  That medication is managed separately. Depression screen [facility] 2/9 10/20/2017 10/12/2017 07/21/2017  Decreased Interest 0 0 0  Down, Depressed, Hopeless 0 0 0  PHQ - 2 Score 0 0 0    History Jaben has a past medical history of Allergic rhinitis, Chronic pain, DDD (degenerative disc disease) (12/28/08), Femur fracture, right ([place]), GERD (gastroesophageal reflux disease), Hematuria, History of GI bleed, Hyperlipidemia, Hypogonadism male, Hypothyroidism, Internal hemorrhoid, and PVD (peripheral vascular disease) ([place]).   He has a past surgical history that includes Carpal tunnel release (8/11) and right ankle  (1976).   His family history includes Anxiety disorder in  his father; Depression in his father; Diabetes in his brother and father; Lung cancer in his father; Peripheral vascular disease in his father.He reports that he has quit smoking. He has never used smokeless tobacco. He reports that he drinks alcohol. He reports that he does not use drugs.    ROS Review of Systems  Constitutional: Negative.   HENT: Negative.   Eyes: Negative for visual disturbance.  Respiratory: Negative for cough and shortness of breath.   Cardiovascular: Negative for chest pain and leg swelling.  Gastrointestinal: Negative for abdominal pain, diarrhea, nausea and vomiting.  Genitourinary: Negative for difficulty urinating.  Musculoskeletal: Negative for arthralgias and myalgias.  Skin: Negative for rash.  Neurological: Negative for headaches.  Psychiatric/Behavioral: Negative for sleep disturbance.    Objective:  BP 104/61   Pulse 64   Temp (!) 97.4 F (36.3 C) (Oral)   Ht '5\' 9"'$  (1.753 m)   Wt 213 lb 8 oz (96.8 kg)   BMI 31.53 kg/m   BP Readings from Last 3 Encounters:  10/20/17 104/61  10/12/17 102/70  07/21/17 112/68    Wt Readings from Last 3 Encounters:  10/20/17 213 lb 8 oz (96.8 kg)  10/12/17 209 lb (94.8 kg)  07/21/17 213 lb (96.6 kg)     Physical Exam  Constitutional: He is oriented to person, place, and time. He appears well-developed and well-nourished. No distress.  HENT:  Head: Normocephalic and atraumatic.  Right Ear: External ear normal.  Left Ear: External ear normal.  Nose: Nose normal.  Mouth/Throat: Oropharynx is clear and moist.  Eyes: Pupils are equal, round, and reactive to light. Conjunctivae and EOM are normal.  Neck: Normal  range of motion. Neck supple. No thyromegaly present.  Cardiovascular: Normal rate, regular rhythm and normal heart sounds.  No murmur heard. Pulmonary/Chest: Effort normal and breath sounds normal. No respiratory distress. He has no wheezes. He has no rales.  Abdominal: Soft. Bowel sounds are  normal. He exhibits no distension. There is no tenderness.  Lymphadenopathy:    He has no cervical adenopathy.  Neurological: He is alert and oriented to person, place, and time. He has normal reflexes.  Skin: Skin is warm and dry.  Psychiatric: He has a normal mood and affect. His behavior is normal. Judgment and thought content normal.      Assessment & Plan:   Curlie was seen today for follow-up.  Diagnoses and all orders for this visit:  Hypogonadism male -     Testosterone,Free and Total  Mixed hyperlipidemia -     CBC with Differential/Platelet -     CMP14+EGFR -     Lipid panel -     Urinalysis  Gastroesophageal reflux disease without esophagitis  Other orders -     atorvastatin (LIPITOR) 40 MG tablet; TAKE ONE (1) TABLET EACH DAY -     montelukast (SINGULAIR) 10 MG tablet; Take 1 tablet (10 mg total) by mouth at bedtime. -     omeprazole (PRILOSEC) 40 MG capsule; TAKE ONE (1) CAPSULE EACH DAY       I have discontinued Duell Holdren. Hardenbrook's clobetasol cream. I am also having him maintain his Calcium Carb-Cholecalciferol (CALCIUM + D3 PO), Methylcellulose (Laxative), Multiple Vitamins-Minerals (CENTRUM SILVER PO), cetirizine, methocarbamol, polyethylene glycol powder, testosterone cypionate, fluticasone, diclofenac, buprenorphine, atorvastatin, montelukast, and omeprazole. We will continue to administer testosterone cypionate.  Allergies as of 10/20/2017      Reactions   Codeine    Penicillins       Medication List        Accurate as of 10/20/17  9:03 AM. Always use your most recent med list.          atorvastatin 40 MG tablet Commonly known as:  LIPITOR TAKE ONE (1) TABLET EACH DAY   buprenorphine 10 MCG/HR Ptwk patch Commonly known as:  BUTRANS APPLY 1 PATCH EVERY WEEK   CALCIUM + D3 PO Take by mouth.   CENTRUM SILVER PO Take 1 tablet by mouth daily.   cetirizine 10 MG tablet Commonly known as:  ZYRTEC Take 1 tablet (10 mg total) by mouth  daily.   diclofenac 75 MG EC tablet Commonly known as:  VOLTAREN TAKE ONE TABLET BY MOUTH TWICE DAILY   fluticasone 50 MCG/ACT nasal spray Commonly known as:  FLONASE USE 2 SPRAYS IN EACH NOSTRIL AT BEDTIME   methocarbamol 500 MG tablet Commonly known as:  ROBAXIN   Methylcellulose (Laxative) 500 MG Tabs Take 2 tablets by mouth daily.   montelukast 10 MG tablet Commonly known as:  SINGULAIR Take 1 tablet (10 mg total) by mouth at bedtime.   omeprazole 40 MG capsule Commonly known as:  PRILOSEC TAKE ONE (1) CAPSULE EACH DAY   polyethylene glycol powder powder Commonly known as:  GLYCOLAX/MIRALAX MIX 17 GRAMS INTO 8OZ OF WATER AND DRINK DAILY   testosterone cypionate 200 MG/ML injection Commonly known as:  DEPOTESTOSTERONE CYPIONATE INJECT 1ML EVERY 2 WEEKS        Follow-up: Return in about 6 months (around 04/21/2018) for Wellness.  For wellness CPE.  3 months for his buprenorphine Claretta Fraise, M.D.

## 2017-10-21 LAB — CMP14+EGFR
A/G RATIO: 2 (ref 1.2–2.2)
ALBUMIN: 4.2 g/dL (ref 3.5–5.5)
ALT: 63 IU/L — ABNORMAL HIGH (ref 0–44)
AST: 44 IU/L — ABNORMAL HIGH (ref 0–40)
Alkaline Phosphatase: 79 IU/L (ref 39–117)
BILIRUBIN TOTAL: 0.6 mg/dL (ref 0.0–1.2)
BUN / CREAT RATIO: 18 (ref 9–20)
BUN: 17 mg/dL (ref 6–24)
CHLORIDE: 101 mmol/L (ref 96–106)
CO2: 26 mmol/L (ref 20–29)
Calcium: 9 mg/dL (ref 8.7–10.2)
Creatinine, Ser: 0.97 mg/dL (ref 0.76–1.27)
GFR, EST AFRICAN AMERICAN: 100 mL/min/{1.73_m2} (ref >59)
GFR, EST NON AFRICAN AMERICAN: 86 mL/min/{1.73_m2} (ref >59)
Globulin, Total: 2.1 g/dL (ref 1.5–4.5)
Glucose: 114 mg/dL — ABNORMAL HIGH (ref 65–99)
Potassium: 4.8 mmol/L (ref 3.5–5.2)
Sodium: 141 mmol/L (ref 134–144)
TOTAL PROTEIN: 6.3 g/dL (ref 6.0–8.5)

## 2017-10-21 LAB — CBC WITH DIFFERENTIAL/PLATELET
BASOS: 0 %
Basophils Absolute: 0 10*3/uL (ref 0.0–0.2)
EOS (ABSOLUTE): 0.1 10*3/uL (ref 0.0–0.4)
Eos: 3 %
HEMOGLOBIN: 16.8 g/dL (ref 13.0–17.7)
Hematocrit: 47.9 % (ref 37.5–51.0)
IMMATURE GRANS (ABS): 0 10*3/uL (ref 0.0–0.1)
Immature Granulocytes: 0 %
Lymphocytes Absolute: 1.6 10*3/uL (ref 0.7–3.1)
Lymphs: 34 %
MCH: 29.8 pg (ref 26.6–33.0)
MCHC: 35.1 g/dL (ref 31.5–35.7)
MCV: 85 fL (ref 79–97)
MONOCYTES: 9 %
Monocytes Absolute: 0.4 10*3/uL (ref 0.1–0.9)
NEUTROS ABS: 2.7 10*3/uL (ref 1.4–7.0)
Neutrophils: 54 %
Platelets: 184 10*3/uL (ref 150–379)
RBC: 5.63 x10E6/uL (ref 4.14–5.80)
RDW: 13.7 % (ref 12.3–15.4)
WBC: 4.9 10*3/uL (ref 3.4–10.8)

## 2017-10-21 LAB — LIPID PANEL
CHOL/HDL RATIO: 3.3 ratio (ref 0.0–5.0)
Cholesterol, Total: 150 mg/dL (ref 100–199)
HDL: 46 mg/dL (ref >39)
LDL CALC: 80 mg/dL (ref 0–99)
Triglycerides: 119 mg/dL (ref 0–149)
VLDL CHOLESTEROL CAL: 24 mg/dL (ref 5–40)

## 2017-10-21 LAB — TESTOSTERONE,FREE AND TOTAL
Testosterone, Free: 4.3 pg/mL — ABNORMAL LOW (ref 7.2–24.0)
Testosterone: 189 ng/dL — ABNORMAL LOW (ref 264–916)

## 2017-10-25 DIAGNOSIS — M5136 Other intervertebral disc degeneration, lumbar region: Secondary | ICD-10-CM | POA: Diagnosis not present

## 2017-11-03 ENCOUNTER — Ambulatory Visit (INDEPENDENT_AMBULATORY_CARE_PROVIDER_SITE_OTHER): Payer: Federal, State, Local not specified - PPO | Admitting: *Deleted

## 2017-11-03 DIAGNOSIS — E349 Endocrine disorder, unspecified: Secondary | ICD-10-CM

## 2017-11-03 NOTE — Progress Notes (Signed)
Pt given Testosterone inj Tolerated well 

## 2017-11-11 ENCOUNTER — Other Ambulatory Visit: Payer: Self-pay | Admitting: Family Medicine

## 2017-11-14 DIAGNOSIS — M5410 Radiculopathy, site unspecified: Secondary | ICD-10-CM | POA: Diagnosis not present

## 2017-11-14 DIAGNOSIS — M5136 Other intervertebral disc degeneration, lumbar region: Secondary | ICD-10-CM | POA: Diagnosis not present

## 2017-11-17 ENCOUNTER — Ambulatory Visit (INDEPENDENT_AMBULATORY_CARE_PROVIDER_SITE_OTHER): Payer: Federal, State, Local not specified - PPO | Admitting: *Deleted

## 2017-11-17 DIAGNOSIS — E349 Endocrine disorder, unspecified: Secondary | ICD-10-CM

## 2017-11-17 NOTE — Progress Notes (Signed)
Testosterone injection given and patient tolerated well.  

## 2017-11-17 NOTE — Patient Instructions (Signed)
Testosterone injection What is this medicine? TESTOSTERONE (tes TOS ter one) is the main male hormone. It supports normal male development such as muscle growth, facial hair, and deep voice. It is used in males to treat low testosterone levels. This medicine may be used for other purposes; ask your health care provider or pharmacist if you have questions. COMMON BRAND NAME(S): Andro-L.A., Aveed, Delatestryl, Depo-Testosterone, Virilon What should I tell my health care provider before I take this medicine? They need to know if you have any of these conditions: -cancer -diabetes -heart disease -kidney disease -liver disease -lung disease -prostate disease -an unusual or allergic reaction to testosterone, other medicines, foods, dyes, or preservatives -pregnant or trying to get pregnant -breast-feeding How should I use this medicine? This medicine is for injection into a muscle. It is usually given by a health care professional in a hospital or clinic setting. Contact your pediatrician regarding the use of this medicine in children. While this medicine may be prescribed for children as young as 12 years of age for selected conditions, precautions do apply. Overdosage: If you think you have taken too much of this medicine contact a poison control center or emergency room at once. NOTE: This medicine is only for you. Do not share this medicine with others. What if I miss a dose? Try not to miss a dose. Your doctor or health care professional will tell you when your next injection is due. Notify the office if you are unable to keep an appointment. What may interact with this medicine? -medicines for diabetes -medicines that treat or prevent blood clots like warfarin -oxyphenbutazone -propranolol -steroid medicines like prednisone or cortisone This list may not describe all possible interactions. Give your health care provider a list of all the medicines, herbs, non-prescription drugs, or  dietary supplements you use. Also tell them if you smoke, drink alcohol, or use illegal drugs. Some items may interact with your medicine. What should I watch for while using this medicine? Visit your doctor or health care professional for regular checks on your progress. They will need to check the level of testosterone in your blood. This medicine is only approved for use in men who have low levels of testosterone related to certain medical conditions. Heart attacks and strokes have been reported with the use of this medicine. Notify your doctor or health care professional and seek emergency treatment if you develop breathing problems; changes in vision; confusion; chest pain or chest tightness; sudden arm pain; severe, sudden headache; trouble speaking or understanding; sudden numbness or weakness of the face, arm or leg; loss of balance or coordination. Talk to your doctor about the risks and benefits of this medicine. This medicine may affect blood sugar levels. If you have diabetes, check with your doctor or health care professional before you change your diet or the dose of your diabetic medicine. Testosterone injections are not commonly used in women. Women should inform their doctor if they wish to become pregnant or think they might be pregnant. There is a potential for serious side effects to an unborn child. Talk to your health care professional or pharmacist for more information. Talk with your doctor or health care professional about your birth control options while taking this medicine. This drug is banned from use in athletes by most athletic organizations. What side effects may I notice from receiving this medicine? Side effects that you should report to your doctor or health care professional as soon as possible: -allergic reactions like skin rash,   itching or hives, swelling of the face, lips, or tongue -breast enlargement -breathing problems -changes in emotions or moods -deep or  hoarse voice -irregular menstrual periods -signs and symptoms of liver injury like dark yellow or brown urine; general ill feeling or flu-like symptoms; light-colored stools; loss of appetite; nausea; right upper belly pain; unusually weak or tired; yellowing of the eyes or skin -stomach pain -swelling of the ankles, feet, hands -too frequent or persistent erections -trouble passing urine or change in the amount of urine Side effects that usually do not require medical attention (report to your doctor or health care professional if they continue or are bothersome): -acne -change in sex drive or performance -facial hair growth -hair loss -headache This list may not describe all possible side effects. Call your doctor for medical advice about side effects. You may report side effects to FDA at 1-800-FDA-1088. Where should I keep my medicine? Keep out of the reach of children. This medicine can be abused. Keep your medicine in a safe place to protect it from theft. Do not share this medicine with anyone. Selling or giving away this medicine is dangerous and against the law. Store at room temperature between 20 and 25 degrees C (68 and 77 degrees F). Do not freeze. Protect from light. Follow the directions for the product you are prescribed. Throw away any unused medicine after the expiration date. NOTE: This sheet is a summary. It may not cover all possible information. If you have questions about this medicine, talk to your doctor, pharmacist, or health care provider.  2018 Elsevier/Gold Standard (2015-07-18 07:33:55)  

## 2017-12-01 ENCOUNTER — Ambulatory Visit (INDEPENDENT_AMBULATORY_CARE_PROVIDER_SITE_OTHER): Payer: Federal, State, Local not specified - PPO | Admitting: *Deleted

## 2017-12-01 DIAGNOSIS — E349 Endocrine disorder, unspecified: Secondary | ICD-10-CM

## 2017-12-01 MED ORDER — TESTOSTERONE CYPIONATE 200 MG/ML IM SOLN
200.0000 mg | INTRAMUSCULAR | Status: AC
  Administered 2017-12-01 – 2018-10-23 (×24): 200 mg via INTRAMUSCULAR

## 2017-12-01 NOTE — Progress Notes (Signed)
Pt given Testosterone inj Tolerated well 

## 2017-12-05 DIAGNOSIS — M5136 Other intervertebral disc degeneration, lumbar region: Secondary | ICD-10-CM | POA: Diagnosis not present

## 2017-12-08 ENCOUNTER — Other Ambulatory Visit: Payer: Self-pay | Admitting: Family Medicine

## 2017-12-15 ENCOUNTER — Ambulatory Visit (INDEPENDENT_AMBULATORY_CARE_PROVIDER_SITE_OTHER): Payer: Federal, State, Local not specified - PPO

## 2017-12-15 DIAGNOSIS — E349 Endocrine disorder, unspecified: Secondary | ICD-10-CM | POA: Diagnosis not present

## 2017-12-22 DIAGNOSIS — M4726 Other spondylosis with radiculopathy, lumbar region: Secondary | ICD-10-CM | POA: Diagnosis not present

## 2017-12-22 DIAGNOSIS — M48062 Spinal stenosis, lumbar region with neurogenic claudication: Secondary | ICD-10-CM | POA: Diagnosis not present

## 2017-12-22 DIAGNOSIS — M4316 Spondylolisthesis, lumbar region: Secondary | ICD-10-CM | POA: Diagnosis not present

## 2017-12-22 DIAGNOSIS — M5410 Radiculopathy, site unspecified: Secondary | ICD-10-CM | POA: Diagnosis not present

## 2017-12-29 ENCOUNTER — Ambulatory Visit (INDEPENDENT_AMBULATORY_CARE_PROVIDER_SITE_OTHER): Payer: Federal, State, Local not specified - PPO | Admitting: *Deleted

## 2017-12-29 DIAGNOSIS — E349 Endocrine disorder, unspecified: Secondary | ICD-10-CM | POA: Diagnosis not present

## 2018-01-04 ENCOUNTER — Ambulatory Visit: Payer: Federal, State, Local not specified - PPO | Admitting: Family Medicine

## 2018-01-04 ENCOUNTER — Encounter: Payer: Self-pay | Admitting: Family Medicine

## 2018-01-04 VITALS — BP 124/66 | HR 83 | Temp 98.0°F | Ht 69.0 in | Wt 215.5 lb

## 2018-01-04 DIAGNOSIS — E291 Testicular hypofunction: Secondary | ICD-10-CM | POA: Diagnosis not present

## 2018-01-04 DIAGNOSIS — M48061 Spinal stenosis, lumbar region without neurogenic claudication: Secondary | ICD-10-CM

## 2018-01-04 DIAGNOSIS — F1120 Opioid dependence, uncomplicated: Secondary | ICD-10-CM

## 2018-01-04 MED ORDER — BUPRENORPHINE 10 MCG/HR TD PTWK: MEDICATED_PATCH | TRANSDERMAL | 2 refills | Status: DC

## 2018-01-04 NOTE — Progress Notes (Signed)
Subjective:  Patient ID: Cesar Leon, male    DOB: 1961-05-24  Age: 57 y.o. MRN: [id number]  CC: pain management   HPI KRISTAIN HU presents for lumbar radiculopathy pain. Planning neurosurgery in 2 weeks. Continues to use the buprenorphine patch. 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 Pain radiates down right posterior thigh. PMP Aware unremarkable.  Still getting testosterone injection biweekly. Bloodwork reviewed from last time shows low trough, but pt denies weakness, impotence, depression. Also denies leg swelling, pain.  Depression screen [facility] 2/9 01/04/2018 10/20/2017 10/12/2017  Decreased Interest 0 0 0  Down, Depressed, Hopeless 0 0 0  PHQ - 2 Score 0 0 0    History Darvin has a past medical history of Allergic rhinitis, Chronic pain, DDD (degenerative disc disease) (12/28/08), Femur fracture, right (HCC), GERD (gastroesophageal reflux disease), Hematuria, History of GI bleed, Hyperlipidemia, Hypogonadism male, Hypothyroidism, Internal hemorrhoid, and PVD (peripheral vascular disease) (HCC).   He has a past surgical history that includes Carpal tunnel release (8/11) and right ankle  (1976).   His family history includes Anxiety disorder in his father; Depression in his father; Diabetes in his brother and father; Lung cancer in his father; Peripheral vascular disease in his father.He reports that he has quit smoking. He has never used smokeless tobacco. He reports that he drinks alcohol. He reports that he does not use drugs.    ROS Review of Systems  Constitutional: Negative for fever.  Respiratory: Negative for shortness of breath.   Cardiovascular: Negative for chest pain.  Musculoskeletal: Negative for arthralgias.  Skin: Negative for rash.    Objective:  BP 124/66   Pulse 83   Temp 98 F (36.7 C) (Oral)   Ht 5\' 9"  (1.753 m)   Wt 215 lb 8 oz (97.8 kg)   BMI 31.82 kg/m   BP  Readings from Last 3 Encounters:  01/04/18 124/66  10/20/17 104/61  10/12/17 102/70    Wt Readings from Last 3 Encounters:  01/04/18 215 lb 8 oz (97.8 kg)  10/20/17 213 lb 8 oz (96.8 kg)  10/12/17 209 lb (94.8 kg)     Physical Exam  Constitutional: He is oriented to person, place, and time. He appears well-developed and well-nourished. No distress.  HENT:  Head: Normocephalic and atraumatic.  Right Ear: External ear normal.  Left Ear: External ear normal.  Nose: Nose normal.  Mouth/Throat: Oropharynx is clear and moist.  Eyes: Pupils are equal, round, and reactive to light. Conjunctivae and EOM are normal.  Neck: Normal range of motion. Neck supple.  Cardiovascular: Normal rate, regular rhythm and normal heart sounds.  No murmur heard. Pulmonary/Chest: Effort normal and breath sounds normal. No respiratory distress. He has no wheezes. He has no rales.  Abdominal: Soft. There is no tenderness.  Musculoskeletal: Normal range of motion.  Neurological: He is alert and oriented to person, place, and time. He has normal reflexes.  Skin: Skin is warm and dry.  Psychiatric: He has a normal mood and affect. His behavior is normal. Judgment and thought content normal.      Assessment & Plan:   Johnavon was seen today for pain management.  Diagnoses and all orders for this visit:  Spinal stenosis of lumbar region, unspecified whether neurogenic claudication present  Opioid type dependence, continuous (HCC) -     ToxASSURE Select 13 (MW), Urine  Hypogonadism male  Other orders -  buprenorphine (BUTRANS - DOSED MCG/HR) 10 MCG/HR PTWK patch; APPLY 1 PATCH EVERY WEEK       I am having Veverly FellsMichael D. Kloeppel maintain his Calcium Carb-Cholecalciferol (CALCIUM + D3 PO), Methylcellulose (Laxative), Multiple Vitamins-Minerals (CENTRUM SILVER PO), cetirizine, methocarbamol, diclofenac, atorvastatin, montelukast, omeprazole, GAVILAX, fluticasone, and buprenorphine. We will continue to  administer testosterone cypionate.  Allergies as of 01/04/2018      Reactions   Codeine    Penicillins       Medication List        Accurate as of 01/04/18  8:24 AM. Always use your most recent med list.          atorvastatin 40 MG tablet Commonly known as:  LIPITOR TAKE ONE (1) TABLET EACH DAY   buprenorphine 10 MCG/HR Ptwk patch Commonly known as:  BUTRANS - dosed mcg/hr APPLY 1 PATCH EVERY WEEK   CALCIUM + D3 PO Take by mouth.   CENTRUM SILVER PO Take 1 tablet by mouth daily.   cetirizine 10 MG tablet Commonly known as:  ZYRTEC Take 1 tablet (10 mg total) by mouth daily.   diclofenac 75 MG EC tablet Commonly known as:  VOLTAREN TAKE ONE TABLET BY MOUTH TWICE DAILY   fluticasone 50 MCG/ACT nasal spray Commonly known as:  FLONASE USE 2 SPRAYS IN EACH NOSTRIL AT BEDTIME   GAVILAX powder Generic drug:  polyethylene glycol powder MIX 17 GRAMS INTO 8OZ OF WATER AND DRINKDAILY   methocarbamol 500 MG tablet Commonly known as:  ROBAXIN   Methylcellulose (Laxative) 500 MG Tabs Take 2 tablets by mouth daily.   montelukast 10 MG tablet Commonly known as:  SINGULAIR Take 1 tablet (10 mg total) by mouth at bedtime.   omeprazole 40 MG capsule Commonly known as:  PRILOSEC TAKE ONE (1) CAPSULE EACH DAY        Follow-up: Return in about 3 months (around 04/06/2018).  Mechele ClaudeWarren Dunya Meiners, M.D.

## 2018-01-04 NOTE — Patient Instructions (Signed)

## 2018-01-08 ENCOUNTER — Other Ambulatory Visit: Payer: Self-pay | Admitting: Family Medicine

## 2018-01-09 DIAGNOSIS — M4316 Spondylolisthesis, lumbar region: Secondary | ICD-10-CM | POA: Diagnosis not present

## 2018-01-09 DIAGNOSIS — M48061 Spinal stenosis, lumbar region without neurogenic claudication: Secondary | ICD-10-CM | POA: Diagnosis not present

## 2018-01-09 DIAGNOSIS — R9431 Abnormal electrocardiogram [ECG] [EKG]: Secondary | ICD-10-CM | POA: Diagnosis not present

## 2018-01-09 DIAGNOSIS — I4510 Unspecified right bundle-branch block: Secondary | ICD-10-CM | POA: Diagnosis not present

## 2018-01-09 DIAGNOSIS — Z01810 Encounter for preprocedural cardiovascular examination: Secondary | ICD-10-CM | POA: Diagnosis not present

## 2018-01-11 DIAGNOSIS — M4726 Other spondylosis with radiculopathy, lumbar region: Secondary | ICD-10-CM | POA: Diagnosis not present

## 2018-01-11 DIAGNOSIS — M4316 Spondylolisthesis, lumbar region: Secondary | ICD-10-CM | POA: Diagnosis not present

## 2018-01-11 DIAGNOSIS — M5410 Radiculopathy, site unspecified: Secondary | ICD-10-CM | POA: Diagnosis not present

## 2018-01-11 DIAGNOSIS — M48062 Spinal stenosis, lumbar region with neurogenic claudication: Secondary | ICD-10-CM | POA: Diagnosis not present

## 2018-01-11 DIAGNOSIS — M545 Low back pain: Secondary | ICD-10-CM | POA: Diagnosis not present

## 2018-01-11 DIAGNOSIS — Z4689 Encounter for fitting and adjustment of other specified devices: Secondary | ICD-10-CM | POA: Diagnosis not present

## 2018-01-11 LAB — TOXASSURE SELECT 13 (MW), URINE

## 2018-01-12 ENCOUNTER — Ambulatory Visit (INDEPENDENT_AMBULATORY_CARE_PROVIDER_SITE_OTHER): Payer: Federal, State, Local not specified - PPO | Admitting: *Deleted

## 2018-01-12 DIAGNOSIS — E349 Endocrine disorder, unspecified: Secondary | ICD-10-CM | POA: Diagnosis not present

## 2018-01-12 NOTE — Progress Notes (Signed)
Pt given Testosterone inj Tolerated well 

## 2018-01-15 DIAGNOSIS — M4316 Spondylolisthesis, lumbar region: Secondary | ICD-10-CM | POA: Insufficient documentation

## 2018-01-15 DIAGNOSIS — Z79899 Other long term (current) drug therapy: Secondary | ICD-10-CM | POA: Diagnosis not present

## 2018-01-15 DIAGNOSIS — Z981 Arthrodesis status: Secondary | ICD-10-CM | POA: Diagnosis not present

## 2018-01-15 DIAGNOSIS — G4733 Obstructive sleep apnea (adult) (pediatric): Secondary | ICD-10-CM | POA: Diagnosis not present

## 2018-01-15 DIAGNOSIS — J439 Emphysema, unspecified: Secondary | ICD-10-CM | POA: Diagnosis not present

## 2018-01-15 DIAGNOSIS — M48062 Spinal stenosis, lumbar region with neurogenic claudication: Secondary | ICD-10-CM | POA: Diagnosis not present

## 2018-01-15 DIAGNOSIS — M5432 Sciatica, left side: Secondary | ICD-10-CM | POA: Diagnosis not present

## 2018-01-15 DIAGNOSIS — Z87891 Personal history of nicotine dependence: Secondary | ICD-10-CM | POA: Diagnosis not present

## 2018-01-15 DIAGNOSIS — M4726 Other spondylosis with radiculopathy, lumbar region: Secondary | ICD-10-CM | POA: Diagnosis not present

## 2018-01-15 DIAGNOSIS — M4326 Fusion of spine, lumbar region: Secondary | ICD-10-CM | POA: Diagnosis not present

## 2018-01-15 DIAGNOSIS — K219 Gastro-esophageal reflux disease without esophagitis: Secondary | ICD-10-CM | POA: Diagnosis not present

## 2018-01-15 DIAGNOSIS — M5431 Sciatica, right side: Secondary | ICD-10-CM | POA: Diagnosis not present

## 2018-01-16 DIAGNOSIS — M4726 Other spondylosis with radiculopathy, lumbar region: Secondary | ICD-10-CM | POA: Diagnosis not present

## 2018-01-16 DIAGNOSIS — M48062 Spinal stenosis, lumbar region with neurogenic claudication: Secondary | ICD-10-CM | POA: Diagnosis not present

## 2018-01-17 DIAGNOSIS — M4316 Spondylolisthesis, lumbar region: Secondary | ICD-10-CM | POA: Diagnosis not present

## 2018-01-17 DIAGNOSIS — M5136 Other intervertebral disc degeneration, lumbar region: Secondary | ICD-10-CM | POA: Diagnosis not present

## 2018-01-17 DIAGNOSIS — Z981 Arthrodesis status: Secondary | ICD-10-CM | POA: Diagnosis not present

## 2018-01-18 ENCOUNTER — Other Ambulatory Visit: Payer: Self-pay | Admitting: Family Medicine

## 2018-01-18 ENCOUNTER — Telehealth: Payer: Self-pay | Admitting: Family Medicine

## 2018-01-18 MED ORDER — ACETAMINOPHEN 325 MG PO TABS
650.00 | ORAL_TABLET | ORAL | Status: DC
Start: ? — End: 2018-01-18

## 2018-01-18 MED ORDER — SENNOSIDES-DOCUSATE SODIUM 8.6-50 MG PO TABS
1.00 | ORAL_TABLET | ORAL | Status: DC
Start: 2018-01-17 — End: 2018-01-18

## 2018-01-18 MED ORDER — PHENOL 1.4 % MT LIQD
1.00 | OROMUCOSAL | Status: DC
Start: ? — End: 2018-01-18

## 2018-01-18 MED ORDER — DIPHENHYDRAMINE HCL 25 MG PO CAPS
25.00 | ORAL_CAPSULE | ORAL | Status: DC
Start: ? — End: 2018-01-18

## 2018-01-18 MED ORDER — SIMETHICONE 80 MG PO CHEW
80.00 | CHEWABLE_TABLET | ORAL | Status: DC
Start: ? — End: 2018-01-18

## 2018-01-18 MED ORDER — ATORVASTATIN CALCIUM 40 MG PO TABS
40.00 | ORAL_TABLET | ORAL | Status: DC
Start: 2018-01-17 — End: 2018-01-18

## 2018-01-18 MED ORDER — DIPHENHYDRAMINE HCL 50 MG/ML IJ SOLN
25.00 | INTRAMUSCULAR | Status: DC
Start: ? — End: 2018-01-18

## 2018-01-18 MED ORDER — OXYCODONE HCL 10 MG PO TABS: 10.0000 mg | ORAL_TABLET | Freq: Four times a day (QID) | ORAL | 0 refills | Status: DC | PRN

## 2018-01-18 MED ORDER — POTASSIUM CHLORIDE IN NACL 20-0.9 MEQ/L-% IV SOLN
100.00 | INTRAVENOUS | Status: DC
Start: ? — End: 2018-01-18

## 2018-01-18 MED ORDER — FLUTICASONE PROPIONATE 50 MCG/ACT NA SUSP
2.00 | NASAL | Status: DC
Start: 2018-01-17 — End: 2018-01-18

## 2018-01-18 MED ORDER — DOCUSATE SODIUM 100 MG PO CAPS
100.00 | ORAL_CAPSULE | ORAL | Status: DC
Start: 2018-01-17 — End: 2018-01-18

## 2018-01-18 MED ORDER — PANTOPRAZOLE SODIUM 40 MG PO TBEC
40.00 | DELAYED_RELEASE_TABLET | ORAL | Status: DC
Start: 2018-01-18 — End: 2018-01-18

## 2018-01-18 MED ORDER — OXYCODONE-ACETAMINOPHEN 5-325 MG PO TABS
1.00 | ORAL_TABLET | ORAL | Status: DC
Start: ? — End: 2018-01-18

## 2018-01-18 MED ORDER — METHOCARBAMOL 500 MG PO TABS
500.00 | ORAL_TABLET | ORAL | Status: DC
Start: 2018-01-17 — End: 2018-01-18

## 2018-01-18 MED ORDER — BUPRENORPHINE 10 MCG/HR TD PTWK
1.00 | MEDICATED_PATCH | TRANSDERMAL | Status: DC
Start: 2018-01-22 — End: 2018-01-18

## 2018-01-18 MED ORDER — MORPHINE SULFATE 2 MG/ML IJ SOLN
2.00 | INTRAMUSCULAR | Status: DC
Start: ? — End: 2018-01-18

## 2018-01-18 MED ORDER — KETOROLAC TROMETHAMINE 15 MG/ML IJ SOLN
15.00 | INTRAMUSCULAR | Status: DC
Start: ? — End: 2018-01-18

## 2018-01-18 MED ORDER — LORATADINE 10 MG PO TABS
10.00 | ORAL_TABLET | ORAL | Status: DC
Start: 2018-01-18 — End: 2018-01-18

## 2018-01-18 MED ORDER — POLYETHYLENE GLYCOL 3350 17 G PO PACK
17.00 g | PACK | ORAL | Status: DC
Start: 2018-01-18 — End: 2018-01-18

## 2018-01-18 NOTE — Telephone Encounter (Signed)
Pt's wife called stating pt had his back surgery and is home but the surgeon wouldn't give him any pain medications since Dr Darlyn ReadStacks does his [place] pain patch Dr Darlyn ReadStacks would need to do pain meds for increased pain due to surgery. In the notes from surgery it does state Continue Post-Op pain management with Dr Darlyn ReadStacks. Can you send in anything for this pt? You don't have an opening tomorrow and are out all next week.

## 2018-01-26 ENCOUNTER — Telehealth: Payer: Self-pay | Admitting: *Deleted

## 2018-01-26 ENCOUNTER — Ambulatory Visit (INDEPENDENT_AMBULATORY_CARE_PROVIDER_SITE_OTHER): Payer: Federal, State, Local not specified - PPO | Admitting: *Deleted

## 2018-01-26 DIAGNOSIS — E349 Endocrine disorder, unspecified: Secondary | ICD-10-CM

## 2018-01-26 NOTE — Telephone Encounter (Signed)
Patient aware of recommendations.  

## 2018-01-26 NOTE — Telephone Encounter (Signed)
Not necessarily for diarrhea, if he develops any blood or worsening abdominal pain that he needs to come in and be seen but if not keep doing the Imodium and it should get better in a couple days.

## 2018-01-26 NOTE — Progress Notes (Signed)
Pt given Testosterone injection IM LUOQ and tolerated well. ?

## 2018-02-07 ENCOUNTER — Encounter: Payer: Self-pay | Admitting: Family Medicine

## 2018-02-07 ENCOUNTER — Other Ambulatory Visit: Payer: Self-pay | Admitting: Family Medicine

## 2018-02-07 ENCOUNTER — Ambulatory Visit: Payer: Federal, State, Local not specified - PPO | Admitting: Family Medicine

## 2018-02-07 VITALS — BP 111/73 | HR 88 | Temp 97.5°F | Ht 69.0 in | Wt 222.1 lb

## 2018-02-07 DIAGNOSIS — G8918 Other acute postprocedural pain: Secondary | ICD-10-CM

## 2018-02-07 DIAGNOSIS — M48061 Spinal stenosis, lumbar region without neurogenic claudication: Secondary | ICD-10-CM | POA: Diagnosis not present

## 2018-02-07 DIAGNOSIS — F1120 Opioid dependence, uncomplicated: Secondary | ICD-10-CM | POA: Diagnosis not present

## 2018-02-07 MED ORDER — OXYCODONE HCL 10 MG PO TABS: 10.0000 mg | ORAL_TABLET | Freq: Four times a day (QID) | ORAL | 0 refills | Status: DC | PRN

## 2018-02-07 NOTE — Progress Notes (Signed)
Subjective:  Patient ID: Cesar Leon, male    DOB: [birth year]  Age: 57 y.o. MRN: [id number]  CC: pain management   HPI Cesar Leon presents for post op pain management. Had lumbar fusion about 3 weeks ago. Record reviewed. Had L2-3 and L4-5 laminectomies plus L4-5 fusion. Using buprenorphine patch.  Pain relief is inadequate. Still ranges up to 8/10. Feels it is limiting his recovery by slowing his ability to perform activities recommended for rehab. Feels progress is too slow.He was given oxycodone at hospital with better relief, while wearing the buprenorphine patch.   [facility] 02/07/2018 01/04/2018 10/20/2017  Decreased Interest 0 0 0  Down, Depressed, Hopeless 0 0 0  PHQ - 2 Score 0 0 0    History Cesar Leon has a past medical history of Allergic rhinitis, Chronic pain, DDD (degenerative disc disease) (12/28/08), Femur fracture, right (HCC), GERD (gastroesophageal reflux disease), Hematuria, History of GI bleed, Hyperlipidemia, Hypogonadism male, Hypothyroidism, Internal hemorrhoid, and PVD (peripheral vascular disease) (HCC).   He has a past surgical history that includes Carpal tunnel release (8/11) and right ankle  (1976).   His family history includes Anxiety disorder in his father; Depression in his father; Diabetes in his brother and father; Lung cancer in his father; Peripheral vascular disease in his father.He reports that he has quit smoking. He has never used smokeless tobacco. He reports that he drinks alcohol. He reports that he does not use drugs.    ROS Review of Systems  Constitutional: Positive for activity change, appetite change and fatigue. Negative for fever.  HENT: Negative.   Respiratory: Negative for shortness of breath.   Cardiovascular: Negative for chest pain.  Musculoskeletal: Positive for arthralgias, back pain and myalgias. Negative for neck stiffness.  Skin: Negative for rash.  Psychiatric/Behavioral: Positive for sleep  disturbance.    Objective:  BP 111/73   Pulse 88   Temp (!) 97.5 F (36.4 C) (Oral)   Ht 5\' 9"  (1.753 m)   Wt 222 lb 2 oz (100.8 kg)   BMI 32.80 kg/m   BP Readings from Last 3 Encounters:  02/07/18 111/73  01/04/18 124/66  10/20/17 104/61    Wt Readings from Last 3 Encounters:  02/07/18 222 lb 2 oz (100.8 kg)  01/04/18 215 lb 8 oz (97.8 kg)  10/20/17 213 lb 8 oz (96.8 kg)     Physical Exam  Constitutional: He is oriented to person, place, and time. He appears well-developed and well-nourished. He appears distressed (due to intensity of pain).  HENT:  Head: Normocephalic and atraumatic.  Right Ear: External ear normal.  Left Ear: External ear normal.  Mouth/Throat: No oropharyngeal exudate or posterior oropharyngeal erythema.  Eyes: Pupils are equal, round, and reactive to light.  Neck: Normal range of motion. Neck supple.  Cardiovascular: Normal rate and regular rhythm.  No murmur heard. Pulmonary/Chest: Breath sounds normal. No respiratory distress.  Abdominal: Soft. There is no tenderness.  Musculoskeletal: He exhibits tenderness (lower back, with surgical wound appearing to be healing well.).  Neurological: He is alert and oriented to person, place, and time.  Skin: Skin is warm and dry.  Vitals reviewed.     Assessment & Plan:   Cesar Leon was seen today for pain management.  Diagnoses and all orders for this visit:  Spinal stenosis of lumbar region, unspecified whether neurogenic claudication present  Opioid type dependence, continuous (HCC) -     ToxASSURE Select 13 (MW), Urine  Acute post-operative pain  Other  orders -     Oxycodone HCl 10 MG TABS; Take 1 tablet (10 mg total) by mouth 4 (four) times daily as needed (for severe pain only).       I am having Cesar Leon maintain his Calcium Carb-Cholecalciferol (CALCIUM + D3 PO), Methylcellulose (Laxative), Multiple Vitamins-Minerals (CENTRUM SILVER PO), cetirizine, methocarbamol,  atorvastatin, montelukast, omeprazole, buprenorphine, GAVILAX, diclofenac, and Oxycodone HCl. We will continue to administer testosterone cypionate.  Allergies as of 02/07/2018      Reactions   Codeine    Penicillins       Medication List        Accurate as of 02/07/18 11:59 PM. Always use your most recent med list.          atorvastatin 40 MG tablet Commonly known as:  LIPITOR TAKE ONE (1) TABLET EACH DAY   buprenorphine 10 MCG/HR Ptwk patch Commonly known as:  BUTRANS - dosed mcg/hr APPLY 1 PATCH EVERY WEEK   CALCIUM + D3 PO Take by mouth.   CENTRUM SILVER PO Take 1 tablet by mouth daily.   cetirizine 10 MG tablet Commonly known as:  ZYRTEC Take 1 tablet (10 mg total) by mouth daily.   diclofenac 75 MG EC tablet Commonly known as:  VOLTAREN TAKE ONE TABLET BY MOUTH TWICE DAILY   fluticasone 50 MCG/ACT nasal spray Commonly known as:  FLONASE USE 2 SPRAYS IN EACH NOSTRIL AT BEDTIME   GAVILAX powder Generic drug:  polyethylene glycol powder MIX 17 GRAMS INTO 8OZ OF WATER AND DRINKDAILY   methocarbamol 500 MG tablet Commonly known as:  ROBAXIN   Methylcellulose (Laxative) 500 MG Tabs Take 2 tablets by mouth daily.   montelukast 10 MG tablet Commonly known as:  SINGULAIR Take 1 tablet (10 mg total) by mouth at bedtime.   omeprazole 40 MG capsule Commonly known as:  PRILOSEC TAKE ONE (1) CAPSULE EACH DAY   Oxycodone HCl 10 MG Tabs Take 1 tablet (10 mg total) by mouth 4 (four) times daily as needed (for severe pain only).        Follow-up: Return in about 2 weeks (around 02/21/2018).  Mechele ClaudeWarren Diara Chaudhari, M.D.

## 2018-02-07 NOTE — Patient Instructions (Signed)
Ok to resume Diclofenac 75mg  twice daily!

## 2018-02-09 ENCOUNTER — Ambulatory Visit (INDEPENDENT_AMBULATORY_CARE_PROVIDER_SITE_OTHER): Payer: Federal, State, Local not specified - PPO | Admitting: *Deleted

## 2018-02-09 DIAGNOSIS — E349 Endocrine disorder, unspecified: Secondary | ICD-10-CM

## 2018-02-09 NOTE — Progress Notes (Signed)
Pt given Testosterone inj Tolerated well 

## 2018-02-12 ENCOUNTER — Encounter: Payer: Self-pay | Admitting: Family Medicine

## 2018-02-12 LAB — TOXASSURE SELECT 13 (MW), URINE

## 2018-02-13 DIAGNOSIS — M545 Low back pain: Secondary | ICD-10-CM | POA: Diagnosis not present

## 2018-02-23 ENCOUNTER — Ambulatory Visit (INDEPENDENT_AMBULATORY_CARE_PROVIDER_SITE_OTHER): Payer: Federal, State, Local not specified - PPO | Admitting: *Deleted

## 2018-02-23 DIAGNOSIS — E349 Endocrine disorder, unspecified: Secondary | ICD-10-CM

## 2018-02-23 NOTE — Progress Notes (Signed)
Pt given Testterone inj Tolerated well

## 2018-02-28 ENCOUNTER — Ambulatory Visit: Payer: Federal, State, Local not specified - PPO | Admitting: Family Medicine

## 2018-03-01 DIAGNOSIS — M1712 Unilateral primary osteoarthritis, left knee: Secondary | ICD-10-CM | POA: Insufficient documentation

## 2018-03-01 DIAGNOSIS — M25562 Pain in left knee: Secondary | ICD-10-CM | POA: Diagnosis not present

## 2018-03-07 ENCOUNTER — Other Ambulatory Visit: Payer: Self-pay | Admitting: Family Medicine

## 2018-03-08 ENCOUNTER — Ambulatory Visit (INDEPENDENT_AMBULATORY_CARE_PROVIDER_SITE_OTHER): Payer: Federal, State, Local not specified - PPO | Admitting: *Deleted

## 2018-03-08 DIAGNOSIS — E349 Endocrine disorder, unspecified: Secondary | ICD-10-CM

## 2018-03-08 DIAGNOSIS — M1712 Unilateral primary osteoarthritis, left knee: Secondary | ICD-10-CM | POA: Diagnosis not present

## 2018-03-08 NOTE — Progress Notes (Signed)
Testosterone inj given Pt tolerated well 

## 2018-03-15 DIAGNOSIS — M1712 Unilateral primary osteoarthritis, left knee: Secondary | ICD-10-CM | POA: Diagnosis not present

## 2018-03-22 DIAGNOSIS — M1712 Unilateral primary osteoarthritis, left knee: Secondary | ICD-10-CM | POA: Diagnosis not present

## 2018-03-23 ENCOUNTER — Ambulatory Visit (INDEPENDENT_AMBULATORY_CARE_PROVIDER_SITE_OTHER): Payer: Federal, State, Local not specified - PPO | Admitting: *Deleted

## 2018-03-23 DIAGNOSIS — E349 Endocrine disorder, unspecified: Secondary | ICD-10-CM | POA: Diagnosis not present

## 2018-03-23 NOTE — Progress Notes (Signed)
Pt given Testosterone inj Tolerated well 

## 2018-03-29 ENCOUNTER — Encounter: Payer: Self-pay | Admitting: Family Medicine

## 2018-03-29 ENCOUNTER — Ambulatory Visit: Payer: Federal, State, Local not specified - PPO | Admitting: Family Medicine

## 2018-03-29 VITALS — BP 129/89 | HR 92 | Temp 98.0°F | Ht 69.0 in | Wt 238.0 lb

## 2018-03-29 DIAGNOSIS — E291 Testicular hypofunction: Secondary | ICD-10-CM

## 2018-03-29 DIAGNOSIS — F1120 Opioid dependence, uncomplicated: Secondary | ICD-10-CM | POA: Diagnosis not present

## 2018-03-29 MED ORDER — BUPRENORPHINE 5 MCG/HR TD PTWK: 5.0000 ug | MEDICATED_PATCH | TRANSDERMAL | 0 refills | Status: DC

## 2018-03-29 NOTE — Progress Notes (Signed)
Subjective:  Patient ID: Cesar Leon, male    DOB: [birth year]  Age: 57 y.o. MRN: [id number]  CC: Follow-up (pain management)   HPI Cesar Leon presents for follow-up of his pain management.  H finished the oxycodone a few weeks ago and did not need any more.  Today he says his pain is down to 3-4/10 and he is getting better.  He still rehabbing from his recent surgery but the pain is doing so much better he would like to taper down on his buprenorphine.  He still cannot bend or lift or twist.  He is walking a lot for rehab he has not started formal physical therapy.  He is doing well with his testosterone.  He is due for follow-up on that in a few weeks and asked that I go ahead and put in his blood work so it can be drawn prior to that visit.  His next shot is due in 2 days.  He is due for peak and trough levels. Depression screen [facility] 2/9 03/29/2018 02/07/2018 01/04/2018  Decreased Interest 1 0 0  Down, Depressed, Hopeless 0 0 0  PHQ - 2 Score 1 0 0    History Saturnino has a past medical history of Allergic rhinitis, Chronic pain, DDD (degenerative disc disease) (12/28/08), Femur fracture, right (HCC), GERD (gastroesophageal reflux disease), Hematuria, History of GI bleed, Hyperlipidemia, Hypogonadism male, Hypothyroidism, Internal hemorrhoid, and PVD (peripheral vascular disease) (HCC).   He has a past surgical history that includes Carpal tunnel release (8/11) and right ankle  (1976).   His family history includes Anxiety disorder in his father; Depression in his father; Diabetes in his brother and father; Lung cancer in his father; Peripheral vascular disease in his father.He reports that he has quit smoking. He has never used smokeless tobacco. He reports that he drinks alcohol. He reports that he does not use drugs.    ROS Review of Systems  Constitutional: Negative for fever.  Respiratory: Negative for shortness of breath.   Cardiovascular: Negative for chest pain.    Musculoskeletal: Negative for arthralgias.  Skin: Negative for rash.    Objective:  BP 129/89   Pulse 92   Temp 98 F (36.7 C)   Ht 5\' 9"  (1.753 m)   Wt 238 lb (108 kg)   BMI 35.15 kg/m   BP Readings from Last 3 Encounters:  03/29/18 129/89  02/07/18 111/73  01/04/18 124/66    Wt Readings from Last 3 Encounters:  03/29/18 238 lb (108 kg)  02/07/18 222 lb 2 oz (100.8 kg)  01/04/18 215 lb 8 oz (97.8 kg)     Physical Exam  Constitutional: He is oriented to person, place, and time. He appears well-developed and well-nourished.  HENT:  Head: Normocephalic and atraumatic.  Right Ear: External ear normal.  Left Ear: External ear normal.  Mouth/Throat: No oropharyngeal exudate or posterior oropharyngeal erythema.  Eyes: Pupils are equal, round, and reactive to light.  Neck: Normal range of motion. Neck supple.  Cardiovascular: Normal rate and regular rhythm.  No murmur heard. Pulmonary/Chest: Breath sounds normal. No respiratory distress.  Neurological: He is alert and oriented to person, place, and time.  Vitals reviewed.     Assessment & Plan:   Criston was seen today for follow-up.  Diagnoses and all orders for this visit:  Opioid type dependence, continuous (HCC)  Hypogonadism male -     Testosterone,Free and Total; Standing  Other orders -     buprenorphine (BUTRANS -  DOSED MCG/HR) 5 MCG/HR PTWK patch; Place 1 patch (5 mcg total) onto the skin once a week.       I have discontinued Swanson Farnell. Clipper's buprenorphine. I am also having him start on buprenorphine. Additionally, I am having him maintain his Calcium Carb-Cholecalciferol (CALCIUM + D3 PO), Methylcellulose (Laxative), Multiple Vitamins-Minerals (CENTRUM SILVER PO), cetirizine, methocarbamol, atorvastatin, montelukast, omeprazole, diclofenac, Oxycodone HCl, fluticasone, and GAVILAX. We will continue to administer testosterone cypionate.  Allergies as of 03/29/2018      Reactions   Codeine     Penicillins       Medication List        Accurate as of 03/29/18  9:36 AM. Always use your most recent med list.          atorvastatin 40 MG tablet Commonly known as:  LIPITOR TAKE ONE (1) TABLET EACH DAY   buprenorphine 5 MCG/HR Ptwk patch Commonly known as:  BUTRANS - dosed mcg/hr Place 1 patch (5 mcg total) onto the skin once a week.   CALCIUM + D3 PO Take by mouth.   CENTRUM SILVER PO Take 1 tablet by mouth daily.   cetirizine 10 MG tablet Commonly known as:  ZYRTEC Take 1 tablet (10 mg total) by mouth daily.   diclofenac 75 MG EC tablet Commonly known as:  VOLTAREN TAKE ONE TABLET BY MOUTH TWICE DAILY   fluticasone 50 MCG/ACT nasal spray Commonly known as:  FLONASE USE 2 SPRAYS IN EACH NOSTRIL AT BEDTIME   GAVILAX powder Generic drug:  polyethylene glycol powder MIX 17 GRAMS INTO 8OZ OF WATER AND DRINKDAILY   methocarbamol 500 MG tablet Commonly known as:  ROBAXIN   Methylcellulose (Laxative) 500 MG Tabs Take 2 tablets by mouth daily.   montelukast 10 MG tablet Commonly known as:  SINGULAIR Take 1 tablet (10 mg total) by mouth at bedtime.   omeprazole 40 MG capsule Commonly known as:  PRILOSEC TAKE ONE (1) CAPSULE EACH DAY   Oxycodone HCl 10 MG Tabs Take 1 tablet (10 mg total) by mouth 4 (four) times daily as needed (for severe pain only).        Follow-up: Return in about 6 weeks (around 05/10/2018) for Pain.   Mechele Claude, M.D.

## 2018-04-05 ENCOUNTER — Other Ambulatory Visit: Payer: Self-pay | Admitting: Family Medicine

## 2018-04-05 NOTE — Telephone Encounter (Signed)
Last seen 03/29/18

## 2018-04-06 ENCOUNTER — Ambulatory Visit (INDEPENDENT_AMBULATORY_CARE_PROVIDER_SITE_OTHER): Payer: Federal, State, Local not specified - PPO | Admitting: *Deleted

## 2018-04-06 ENCOUNTER — Other Ambulatory Visit: Payer: Federal, State, Local not specified - PPO

## 2018-04-06 DIAGNOSIS — E349 Endocrine disorder, unspecified: Secondary | ICD-10-CM | POA: Diagnosis not present

## 2018-04-06 DIAGNOSIS — E291 Testicular hypofunction: Secondary | ICD-10-CM

## 2018-04-06 NOTE — Progress Notes (Signed)
Pt given Testosterone inj Tolerated well 

## 2018-04-07 ENCOUNTER — Other Ambulatory Visit: Payer: Federal, State, Local not specified - PPO

## 2018-04-07 DIAGNOSIS — E291 Testicular hypofunction: Secondary | ICD-10-CM | POA: Diagnosis not present

## 2018-04-08 LAB — TESTOSTERONE,FREE AND TOTAL
TESTOSTERONE FREE: 8 pg/mL (ref 7.2–24.0)
Testosterone: 317 ng/dL (ref 264–916)

## 2018-04-09 LAB — TESTOSTERONE,FREE AND TOTAL
TESTOSTERONE FREE: 12.2 pg/mL (ref 7.2–24.0)
Testosterone: 524 ng/dL (ref 264–916)

## 2018-04-10 ENCOUNTER — Encounter: Payer: Self-pay | Admitting: *Deleted

## 2018-04-17 DIAGNOSIS — M4326 Fusion of spine, lumbar region: Secondary | ICD-10-CM | POA: Diagnosis not present

## 2018-04-20 ENCOUNTER — Ambulatory Visit (INDEPENDENT_AMBULATORY_CARE_PROVIDER_SITE_OTHER): Payer: Federal, State, Local not specified - PPO | Admitting: *Deleted

## 2018-04-20 ENCOUNTER — Ambulatory Visit: Payer: Federal, State, Local not specified - PPO | Admitting: Family Medicine

## 2018-04-20 DIAGNOSIS — E349 Endocrine disorder, unspecified: Secondary | ICD-10-CM | POA: Diagnosis not present

## 2018-04-23 ENCOUNTER — Ambulatory Visit: Payer: Federal, State, Local not specified - PPO | Attending: Physician Assistant | Admitting: Physical Therapy

## 2018-04-23 ENCOUNTER — Encounter: Payer: Self-pay | Admitting: Physical Therapy

## 2018-04-23 DIAGNOSIS — G8929 Other chronic pain: Secondary | ICD-10-CM | POA: Diagnosis not present

## 2018-04-23 DIAGNOSIS — R262 Difficulty in walking, not elsewhere classified: Secondary | ICD-10-CM | POA: Insufficient documentation

## 2018-04-23 DIAGNOSIS — M545 Low back pain: Secondary | ICD-10-CM | POA: Diagnosis not present

## 2018-04-23 DIAGNOSIS — M6281 Muscle weakness (generalized): Secondary | ICD-10-CM | POA: Diagnosis not present
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
## 2018-04-26 ENCOUNTER — Ambulatory Visit: Payer: Federal, State, Local not specified - PPO | Admitting: Physical Therapy

## 2018-04-26 ENCOUNTER — Encounter: Payer: Self-pay | Admitting: Physical Therapy

## 2018-04-26 DIAGNOSIS — M545 Low back pain: Principal | ICD-10-CM

## 2018-04-26 DIAGNOSIS — M6281 Muscle weakness (generalized): Secondary | ICD-10-CM | POA: Diagnosis not present

## 2018-04-26 DIAGNOSIS — R262 Difficulty in walking, not elsewhere classified: Secondary | ICD-10-CM

## 2018-04-26 DIAGNOSIS — G8929 Other chronic pain: Secondary | ICD-10-CM

## 2018-04-26 NOTE — Therapy (Signed)
[facility] [address] [place], [place], [address] Phone: [phone]   Fax:  [phone]  Physical Therapy Treatment  Patient Details  Name: Cesar Leon MRN: [id number] Date of Birth: Dec 25, 1960 Referring Provider (PT): Bolivar Haw PA   Encounter Date: 04/26/2018  PT End of Session - 04/26/18 0858    Visit Number  2    Number of Visits  16    Date for PT Re-Evaluation  07/16/18    Authorization Type  BCBS    PT Start Time  0903    PT Stop Time  0952    PT Time Calculation (min)  49 min    Activity Tolerance  Patient tolerated treatment well    Behavior During Therapy  [facility] for tasks assessed/performed       Past Medical History:  Diagnosis Date  . Allergic rhinitis   . Chronic pain   . DDD (degenerative disc disease) 12/28/08  . Femur fracture, right (HCC)   . GERD (gastroesophageal reflux disease)   . Hematuria   . History of GI bleed   . Hyperlipidemia   . Hypogonadism male   . Hypothyroidism   . Internal hemorrhoid   . PVD (peripheral vascular disease) (HCC)     Past Surgical History:  Procedure Laterality Date  . CARPAL TUNNEL RELEASE  8/11  . right ankle   1976    There were no vitals filed for this visit.  Subjective Assessment - 04/26/18 0858    Subjective  Reports a little soreness in low back upon arrival.    Pertinent History  DDD; right femoral fracture; CTS; right ankle surgery, s/p lumbar fusion on 01/15/18    Limitations  House hold activities;Other (comment);Lifting    How long can you walk comfortably?  I'm walking about 2 miles each day, pt reporting his left knee has been surgery,     Diagnostic tests  X-rays following surgery last one on 04/17/18.     Patient Stated Goals  Get back to work    Currently in Pain?  Yes    Pain Score  1     Pain Location  Back    Pain Orientation  Lower    Pain Descriptors / Indicators  Sore    Pain Type  Surgical pain    Pain Onset  More than a  month ago         [facility] PT Assessment - 04/26/18 0001      Assessment   Medical Diagnosis  s/p lumbar fusion    Referring Provider (PT)  Bolivar Haw PA    Onset Date/Surgical Date  01/15/18    Next MD Visit  January 2020      Precautions   Precautions  Back    Precaution Comments  no lifting over 35#    Required Braces or Orthoses  Other Brace/Splint    Other Brace/Splint  lumbar corset as needed      Restrictions   Weight Bearing Restrictions  No                   [facility] Adult PT Treatment/Exercise - 04/26/18 0001      Exercises   Exercises  Lumbar      Lumbar Exercises: Stretches   Single Knee to Chest Stretch  Right;Left;3 reps;30 seconds      Lumbar Exercises: Aerobic   Nustep  L4 x15 min with core      Lumbar Exercises: Standing   Scapular Retraction  Strengthening;Both;20 reps;Limitations    Scapular Retraction Limitations  Pink XTS      Lumbar Exercises: Supine   Ab Set  10 reps;5 seconds    Glut Set  10 reps;5 seconds    Heel Slides  20 reps    Bent Knee Raise  20 reps      Modalities   Modalities  Electrical Stimulation;Moist Heat      Moist Heat Therapy   Number Minutes Moist Heat  15 Minutes    Moist Heat Location  Lumbar Spine      Electrical Stimulation   Electrical Stimulation Location  B low back    Electrical Stimulation Action  IFC    Electrical Stimulation Parameters  80-150 hz x15 min    Electrical Stimulation Goals  Pain               PT Short Term Goals - 04/23/18 0930      PT SHORT TERM GOAL #1   Title  Pt will be independent with proper sitting and standing posture.     Baseline  pt currently wearing a lumbar corset to maintain proper back posture.     Time  3    Period  Weeks    Status  New    Target Date  05/14/18      PT SHORT TERM GOAL #2   Title  Pt will be independent in his initial HEP.     Baseline  issued sit to stand exercise on 04/23/18.     Time  4    Period  Weeks    Status  New     Target Date  05/21/18        PT Long Term Goals - 04/23/18 0933      PT LONG TERM GOAL #1   Title  Pt will be independent in HEP and progression    Time  8    Period  Weeks    Status  New      PT LONG TERM GOAL #2   Title  Pt able to demonstrate proper lifting body mechanics when lifting >/= 25# from floor.     Time  8    Period  Weeks    Status  New      PT LONG TERM GOAL #3   Title  Pt will improve his FOTO score from 74% limiation to 54% limitation.     Time  8    Period  Weeks    Status  New      PT LONG TERM GOAL #4   Title  Pt will improve his 5 time sit to stand </= 15 seconds in order to improve functional mobility.     Baseline  37.12 seconds with UE support on 04/23/18    Time  8    Period  Weeks    Status  New    Target Date  06/18/18      PT LONG TERM GOAL #5   Title  Pt to report no pain when amb for 20 minutes community surfaces.     Time  8    Period  Weeks    Status  New            Plan - 04/26/18 1040    Clinical Impression Statement  Patient tolerated today's treatment fairly well as he was guided through light core and lumbar strengthening. Patient educated in proper core activation technique and instructed in core activation throughout the treatment. VCs  to avoid valsalva maneuver today. Patient able to demonstrate proper log rolling technique which he says it is compliant with at home as well. No complaints of any increased pain with any exercises. Patient interested purchasing TENS unit for home. Normal modalities response noted following removal of the modalities. Patient compliant with lumbar corset use.    Rehab Potential  Good    PT Frequency  2x / week    PT Duration  8 weeks    PT Treatment/Interventions  ADLs/Self Care Home Management;Cryotherapy;Electrical Stimulation;Gait training;Ultrasound;Moist Heat;Stair training;Functional mobility training;Therapeutic activities;Therapeutic exercise;Balance training;Neuromuscular  re-education;Manual techniques;Patient/family education;Passive range of motion;Taping    PT Next Visit Plan  core strengthening, lumbar stretching, LE strengthening, sit to stand, modaliites as needed. No lifting over 35# per MD (gradual progression), wean lumbar corset over the next 3-4 weeks    PT Home Exercise Plan  sit to stand, walking progression    Consulted and Agree with Plan of Care  Patient       Patient will benefit from skilled therapeutic intervention in order to improve the following deficits and impairments:  Abnormal gait, Decreased activity tolerance, Decreased mobility, Decreased range of motion, Decreased strength, Postural dysfunction, Pain, Improper body mechanics, Difficulty walking  Visit Diagnosis: Chronic bilateral low back pain without sciatica  Muscle weakness (generalized)  Difficulty in walking, not elsewhere classified     Problem List Patient Active Problem List   Diagnosis Date Noted  . Elevated fasting glucose 09/28/2015  . Pain medication agreement completed 09/28/2015  . Opioid type dependence, continuous (HCC) 09/28/2015  . Gastroesophageal reflux disease without esophagitis 08/05/2015  . Spinal stenosis of lumbar region 04/29/2015  . Hypogonadism male 09/22/2014  . Hyperlipemia 09/22/2014    Marvell Fuller, PTA 04/26/2018, 11:07 AM  [facility] [address] [place], [place], [address] Phone: [phone]   Fax:  [phone]  Name: Cesar Leon MRN: [id number] Date of Birth: [birth year]

## 2018-04-30 ENCOUNTER — Ambulatory Visit: Payer: Federal, State, Local not specified - PPO | Attending: Physician Assistant | Admitting: Physical Therapy

## 2018-04-30 ENCOUNTER — Encounter: Payer: Self-pay | Admitting: Physical Therapy

## 2018-04-30 DIAGNOSIS — M545 Low back pain: Secondary | ICD-10-CM | POA: Diagnosis not present

## 2018-04-30 DIAGNOSIS — M5442 Lumbago with sciatica, left side: Secondary | ICD-10-CM | POA: Insufficient documentation

## 2018-04-30 DIAGNOSIS — M6281 Muscle weakness (generalized): Secondary | ICD-10-CM | POA: Insufficient documentation

## 2018-04-30 DIAGNOSIS — R262 Difficulty in walking, not elsewhere classified: Secondary | ICD-10-CM | POA: Insufficient documentation

## 2018-04-30 DIAGNOSIS — G8929 Other chronic pain: Secondary | ICD-10-CM | POA: Insufficient documentation

## 2018-04-30 NOTE — Therapy (Signed)
[facility] [address] [place], [place], [address] Phone: [phone]   Fax:  [phone]  Physical Therapy Treatment  Patient Details  Name: Cesar Leon MRN: [id number] Date of Birth: 1961/01/30 Referring Provider (PT): Bolivar Haw PA   Encounter Date: 04/30/2018  PT End of Session - 04/30/18 0932    Visit Number  3    Number of Visits  16    Date for PT Re-Evaluation  07/16/18    Authorization Type  BCBS    PT Start Time  0854    PT Stop Time  0944    PT Time Calculation (min)  50 min    Activity Tolerance  Patient tolerated treatment well    Behavior During Therapy  [facility] for tasks assessed/performed       Past Medical History:  Diagnosis Date  . Allergic rhinitis   . Chronic pain   . DDD (degenerative disc disease) 12/28/08  . Femur fracture, right (HCC)   . GERD (gastroesophageal reflux disease)   . Hematuria   . History of GI bleed   . Hyperlipidemia   . Hypogonadism male   . Hypothyroidism   . Internal hemorrhoid   . PVD (peripheral vascular disease) (HCC)     Past Surgical History:  Procedure Laterality Date  . CARPAL TUNNEL RELEASE  8/11  . right ankle   1976    There were no vitals filed for this visit.  Subjective Assessment - 04/30/18 0935    Subjective  Still a little sore.  Been weaning myself from the brace at home.    Patient is accompained by:  Family member    Pertinent History  DDD; right femoral fracture; CTS; right ankle surgery, s/p lumbar fusion on 01/15/18    Limitations  House hold activities;Other (comment);Lifting    How long can you walk comfortably?  I'm walking about 2 miles each day, pt reporting his left knee has been surgery,     Diagnostic tests  X-rays following surgery last one on 04/17/18.     Patient Stated Goals  Get back to work    Currently in Pain?  Yes    Pain Score  1     Pain Location  Back    Pain Orientation  Lower    Pain Descriptors / Indicators  Sore     Pain Type  Surgical pain    Pain Onset  More than a month ago                       [facility] Adult PT Treatment/Exercise - 04/30/18 0001      Exercises   Exercises  Lumbar;Knee/Hip      Lumbar Exercises: Aerobic   Nustep  Level 3 x 15 minutes.      Lumbar Exercises: Standing   Scapular Retraction Limitations  Green XTS scap retraction with elbows low, high and in full extension and forward punches, each motion performed one minute each.      Lumbar Exercises: Supine   Other Supine Lumbar Exercises  SKTC 90 sec each side, DKTC x one minute and 2 sets to fatigue 50% range hip bridges.      Modalities   Modalities  Electrical Stimulation;Moist Heat      Moist Heat Therapy   Number Minutes Moist Heat  20 Minutes    Moist Heat Location  Lumbar Spine      Electrical Stimulation   Electrical Stimulation Location  Bilateral low  back.    Electrical Stimulation Action  IFC    Electrical Stimulation Parameters  80-150 Hz x 20 minutes.    Electrical Stimulation Goals  Pain               PT Short Term Goals - 04/23/18 0930      PT SHORT TERM GOAL #1   Title  Pt will be independent with proper sitting and standing posture.     Baseline  pt currently wearing a lumbar corset to maintain proper back posture.     Time  3    Period  Weeks    Status  New    Target Date  05/14/18      PT SHORT TERM GOAL #2   Title  Pt will be independent in his initial HEP.     Baseline  issued sit to stand exercise on 04/23/18.     Time  4    Period  Weeks    Status  New    Target Date  05/21/18        PT Long Term Goals - 04/23/18 0933      PT LONG TERM GOAL #1   Title  Pt will be independent in HEP and progression    Time  8    Period  Weeks    Status  New      PT LONG TERM GOAL #2   Title  Pt able to demonstrate proper lifting body mechanics when lifting >/= 25# from floor.     Time  8    Period  Weeks    Status  New      PT LONG TERM GOAL #3   Title  Pt  will improve his FOTO score from 74% limiation to 54% limitation.     Time  8    Period  Weeks    Status  New      PT LONG TERM GOAL #4   Title  Pt will improve his 5 time sit to stand </= 15 seconds in order to improve functional mobility.     Baseline  37.12 seconds with UE support on 04/23/18    Time  8    Period  Weeks    Status  New    Target Date  06/18/18      PT LONG TERM GOAL #5   Title  Pt to report no pain when amb for 20 minutes community surfaces.     Time  8    Period  Weeks    Status  New            Plan - 04/30/18 1005    Clinical Impression Statement  Patient did great today.  He has been weaning himself from the brace at home.  He is doing an excellent log roll technique as well.      PT Treatment/Interventions  ADLs/Self Care Home Management;Cryotherapy;Electrical Stimulation;Gait training;Ultrasound;Moist Heat;Stair training;Functional mobility training;Therapeutic activities;Therapeutic exercise;Balance training;Neuromuscular re-education;Manual techniques;Patient/family education;Passive range of motion;Taping    PT Next Visit Plan  core strengthening, lumbar stretching, LE strengthening, sit to stand, modaliites as needed. No lifting over 35# per MD (gradual progression), wean lumbar corset over the next 3-4 weeks    PT Home Exercise Plan  sit to stand, walking progression    Consulted and Agree with Plan of Care  Patient       Patient will benefit from skilled therapeutic intervention in order to improve the following deficits and impairments:  Visit Diagnosis: Chronic bilateral low back pain without sciatica  Muscle weakness (generalized)  Difficulty in walking, not elsewhere classified  Chronic midline low back pain with left-sided sciatica     Problem List Patient Active Problem List   Diagnosis Date Noted  . Elevated fasting glucose 09/28/2015  . Pain medication agreement completed 09/28/2015  . Opioid type dependence, continuous  (HCC) 09/28/2015  . Gastroesophageal reflux disease without esophagitis 08/05/2015  . Spinal stenosis of lumbar region 04/29/2015  . Hypogonadism male 09/22/2014  . Hyperlipemia 09/22/2014    Nalin Mazzocco, [place] MPT 04/30/2018, 10:10 AM  [facility] [address] [place], [place], [address] Phone: [phone]   Fax:  [phone]  Name: MAREK NGHIEM MRN: [id number] Date of Birth: March 31, 1961

## 2018-05-02 ENCOUNTER — Other Ambulatory Visit: Payer: Self-pay | Admitting: Family Medicine

## 2018-05-02 DIAGNOSIS — M25562 Pain in left knee: Secondary | ICD-10-CM | POA: Diagnosis not present

## 2018-05-02 DIAGNOSIS — M1712 Unilateral primary osteoarthritis, left knee: Secondary | ICD-10-CM | POA: Diagnosis not present

## 2018-05-03 ENCOUNTER — Encounter: Payer: Self-pay | Admitting: Physical Therapy

## 2018-05-03 ENCOUNTER — Encounter: Payer: Self-pay | Admitting: *Deleted

## 2018-05-03 ENCOUNTER — Ambulatory Visit: Payer: Federal, State, Local not specified - PPO | Admitting: Physical Therapy

## 2018-05-03 DIAGNOSIS — G8929 Other chronic pain: Secondary | ICD-10-CM | POA: Diagnosis not present

## 2018-05-03 DIAGNOSIS — M6281 Muscle weakness (generalized): Secondary | ICD-10-CM | POA: Diagnosis not present

## 2018-05-03 DIAGNOSIS — M545 Low back pain: Principal | ICD-10-CM

## 2018-05-03 DIAGNOSIS — R262 Difficulty in walking, not elsewhere classified: Secondary | ICD-10-CM

## 2018-05-03 DIAGNOSIS — M5442 Lumbago with sciatica, left side: Secondary | ICD-10-CM | POA: Diagnosis not present

## 2018-05-03 NOTE — Therapy (Signed)
[facility] [address] [place], [place], [address] Phone: [phone]   Fax:  [phone]  Physical Therapy Treatment  Patient Details  Name: Cesar Leon MRN: [id number] Date of Birth: 12-05-1960 Referring Provider (PT): Bolivar Haw PA   Encounter Date: 05/03/2018  PT End of Session - 05/03/18 0913    Visit Number  4    Number of Visits  16    Date for PT Re-Evaluation  07/16/18    Authorization Type  BCBS    PT Start Time  0900    PT Stop Time  0954    PT Time Calculation (min)  54 min    Activity Tolerance  Patient tolerated treatment well    Behavior During Therapy  [facility] for tasks assessed/performed       Past Medical History:  Diagnosis Date  . Allergic rhinitis   . Chronic pain   . DDD (degenerative disc disease) 12/28/08  . Femur fracture, right (HCC)   . GERD (gastroesophageal reflux disease)   . Hematuria   . History of GI bleed   . Hyperlipidemia   . Hypogonadism male   . Hypothyroidism   . Internal hemorrhoid   . PVD (peripheral vascular disease) (HCC)     Past Surgical History:  Procedure Laterality Date  . CARPAL TUNNEL RELEASE  8/11  . right ankle   1976    There were no vitals filed for this visit.  Subjective Assessment - 05/03/18 0908    Subjective  Patient reports feeling "alright" reports 1/10 pain today.    Patient is accompained by:  Family member    Pertinent History  DDD; right femoral fracture; CTS; right ankle surgery, s/p lumbar fusion on 01/15/18    Limitations  House hold activities;Other (comment);Lifting    How long can you walk comfortably?  I'm walking about 2 miles each day, pt reporting his left knee has been surgery,     Diagnostic tests  X-rays following surgery last one on 04/17/18.     Patient Stated Goals  Get back to work    Currently in Pain?  Yes    Pain Score  1     Pain Location  Back    Pain Orientation  Lower    Pain Descriptors / Indicators  Sore    Pain  Type  Surgical pain    Pain Onset  More than a month ago    Pain Frequency  Constant         [facility] PT Assessment - 05/03/18 0001      Assessment   Medical Diagnosis  s/p lumbar fusion    Referring Provider (PT)  Bolivar Haw PA    Onset Date/Surgical Date  01/15/18    Next MD Visit  January 2020      Precautions   Precautions  Back    Precaution Comments  no lifting over 35#    Required Braces or Orthoses  Other Brace/Splint    Other Brace/Splint  lumbar corset as needed      Restrictions   Weight Bearing Restrictions  No                   [facility] Adult PT Treatment/Exercise - 05/03/18 0001      Exercises   Exercises  Lumbar;Knee/Hip      Lumbar Exercises: Aerobic   Nustep  Level 3 x 15 minutes.      Lumbar Exercises: Standing   Scapular Retraction  Strengthening;Both;20 reps;Limitations  Scapular Retraction Limitations  Pink XTS    Shoulder Extension  Strengthening;Both;20 reps    Shoulder Extension Limitations  Pink XTS      Lumbar Exercises: Supine   Ab Set  20 reps;5 seconds    Glut Set  20 reps;5 seconds    Clam  20 reps    Bent Knee Raise  20 reps    Other Supine Lumbar Exercises  hip adduction 5" hold x20      Modalities   Modalities  Electrical Stimulation;Moist Heat      Moist Heat Therapy   Number Minutes Moist Heat  15 Minutes    Moist Heat Location  Lumbar Spine      Electrical Stimulation   Electrical Stimulation Location  Bilateral low back.    Electrical Stimulation Action  IFC    Electrical Stimulation Parameters  80-150 hz x15 min    Electrical Stimulation Goals  Pain               PT Short Term Goals - 04/23/18 0930      PT SHORT TERM GOAL #1   Title  Pt will be independent with proper sitting and standing posture.     Baseline  pt currently wearing a lumbar corset to maintain proper back posture.     Time  3    Period  Weeks    Status  New    Target Date  05/14/18      PT SHORT TERM GOAL #2   Title   Pt will be independent in his initial HEP.     Baseline  issued sit to stand exercise on 04/23/18.     Time  4    Period  Weeks    Status  New    Target Date  05/21/18        PT Long Term Goals - 04/23/18 0933      PT LONG TERM GOAL #1   Title  Pt will be independent in HEP and progression    Time  8    Period  Weeks    Status  New      PT LONG TERM GOAL #2   Title  Pt able to demonstrate proper lifting body mechanics when lifting >/= 25# from floor.     Time  8    Period  Weeks    Status  New      PT LONG TERM GOAL #3   Title  Pt will improve his FOTO score from 74% limiation to 54% limitation.     Time  8    Period  Weeks    Status  New      PT LONG TERM GOAL #4   Title  Pt will improve his 5 time sit to stand </= 15 seconds in order to improve functional mobility.     Baseline  37.12 seconds with UE support on 04/23/18    Time  8    Period  Weeks    Status  New    Target Date  06/18/18      PT LONG TERM GOAL #5   Title  Pt to report no pain when amb for 20 minutes community surfaces.     Time  8    Period  Weeks    Status  New            Plan - 05/03/18 0941    Clinical Impression Statement  Patient was able to tolerate treatment well with  no reports of increased pain. Patient was able to demonstrate good form with all exercises as well as demonstrate proper technique with bed mobility and log rolling. Normal response to modalities upon removal. Patient demonstrated independence with donning and doffing brace.    Clinical Decision Making  Low    Rehab Potential  Good    PT Frequency  2x / week    PT Duration  8 weeks    PT Treatment/Interventions  ADLs/Self Care Home Management;Cryotherapy;Electrical Stimulation;Gait training;Ultrasound;Moist Heat;Stair training;Functional mobility training;Therapeutic activities;Therapeutic exercise;Balance training;Neuromuscular re-education;Manual techniques;Patient/family education;Passive range of motion;Taping    PT  Next Visit Plan  core strengthening, lumbar stretching, LE strengthening, sit to stand, modaliites as needed. No lifting over 35# per MD (gradual progression), wean lumbar corset over the next 3-4 weeks    PT Home Exercise Plan  sit to stand, walking progression    Consulted and Agree with Plan of Care  Patient       Patient will benefit from skilled therapeutic intervention in order to improve the following deficits and impairments:  Abnormal gait, Decreased activity tolerance, Decreased mobility, Decreased range of motion, Decreased strength, Postural dysfunction, Pain, Improper body mechanics, Difficulty walking  Visit Diagnosis: Chronic bilateral low back pain without sciatica  Muscle weakness (generalized)  Difficulty in walking, not elsewhere classified     Problem List Patient Active Problem List   Diagnosis Date Noted  . Elevated fasting glucose 09/28/2015  . Pain medication agreement completed 09/28/2015  . Opioid type dependence, continuous (HCC) 09/28/2015  . Gastroesophageal reflux disease without esophagitis 08/05/2015  . Spinal stenosis of lumbar region 04/29/2015  . Hypogonadism male 09/22/2014  . Hyperlipemia 09/22/2014   Guss Bunde, PT, DPT 05/03/2018, 10:00 AM  [facility] [address] [place], [place], [address] Phone: [phone]   Fax:  [phone]  Name: Cesar Leon MRN: [id number] Date of Birth: 1961-06-24

## 2018-05-04 ENCOUNTER — Ambulatory Visit: Payer: Federal, State, Local not specified - PPO | Admitting: Family Medicine

## 2018-05-04 ENCOUNTER — Encounter: Payer: Self-pay | Admitting: Family Medicine

## 2018-05-04 VITALS — BP 112/78 | HR 81 | Temp 97.9°F | Ht 69.0 in | Wt 241.0 lb

## 2018-05-04 DIAGNOSIS — M48061 Spinal stenosis, lumbar region without neurogenic claudication: Secondary | ICD-10-CM | POA: Diagnosis not present

## 2018-05-04 DIAGNOSIS — E782 Mixed hyperlipidemia: Secondary | ICD-10-CM | POA: Diagnosis not present

## 2018-05-04 DIAGNOSIS — K219 Gastro-esophageal reflux disease without esophagitis: Secondary | ICD-10-CM | POA: Diagnosis not present

## 2018-05-04 DIAGNOSIS — E349 Endocrine disorder, unspecified: Secondary | ICD-10-CM

## 2018-05-04 DIAGNOSIS — R21 Rash and other nonspecific skin eruption: Secondary | ICD-10-CM

## 2018-05-04 DIAGNOSIS — J301 Allergic rhinitis due to pollen: Secondary | ICD-10-CM

## 2018-05-04 DIAGNOSIS — E291 Testicular hypofunction: Secondary | ICD-10-CM | POA: Diagnosis not present

## 2018-05-04 DIAGNOSIS — R7301 Impaired fasting glucose: Secondary | ICD-10-CM

## 2018-05-04 MED ORDER — ATORVASTATIN CALCIUM 40 MG PO TABS: ORAL_TABLET | ORAL | 1 refills | Status: DC

## 2018-05-04 MED ORDER — CETIRIZINE HCL 10 MG PO TABS: 10.0000 mg | ORAL_TABLET | Freq: Every day | ORAL | 1 refills | Status: DC

## 2018-05-04 MED ORDER — OMEPRAZOLE 40 MG PO CPDR: DELAYED_RELEASE_CAPSULE | ORAL | 1 refills | Status: DC

## 2018-05-04 MED ORDER — FLUTICASONE PROPIONATE 50 MCG/ACT NA SUSP: NASAL | 5 refills | Status: DC

## 2018-05-04 MED ORDER — DICLOFENAC SODIUM 75 MG PO TBEC: 75.0000 mg | DELAYED_RELEASE_TABLET | Freq: Two times a day (BID) | ORAL | 1 refills | Status: DC

## 2018-05-04 MED ORDER — MONTELUKAST SODIUM 10 MG PO TABS: 10.0000 mg | ORAL_TABLET | Freq: Every day | ORAL | 1 refills | Status: DC

## 2018-05-04 NOTE — Patient Instructions (Signed)
Follow up with Dr. Stacks in 6 months.  

## 2018-05-04 NOTE — Progress Notes (Signed)
Subjective:  Patient ID: Cesar Leon, male    DOB: 02/10/1961  Age: 57 y.o. MRN: [id number]  CC: Follow-up   HPI Cesar Leon presents for follow up of Follow up for testosterone deficiency: Pt. Using medication as directed. Denies any sx referrable to DVT such as edema or erythema of legs. No dyspnea or chest pain. Energy level reported as being good. Libido is normal and denies E.D. Feels strength is adequate and improved from baseline.  Patient in for follow-up of elevated cholesterol. Doing well without complaints on current medication. Denies side effects of statin including myalgia and arthralgia and nausea. Also in today for liver function testing. Currently no chest pain, shortness of breath or other cardiovascular related symptoms noted.  Patient in for follow-up of GERD. Currently asymptomatic taking  PPI daily. There is no chest pain or heartburn. No hematemesis and no melena. No dysphagia or choking. Onset is remote. Progression is stable. Complicating factors, none.  Patient says pain is much better.  He has 3 weeks left of his current check which time he wants to start tapering further.  Depression screen [facility] 2/9 05/04/2018 03/29/2018 02/07/2018  Decreased Interest 0 1 0  Down, Depressed, Hopeless 0 0 0  PHQ - 2 Score 0 1 0    History Cesar Leon has a past medical history of Allergic rhinitis, Chronic pain, DDD (degenerative disc disease) (12/28/08), Femur fracture, right (Cesar Leon), GERD (gastroesophageal reflux disease), Hematuria, History of GI bleed, Hyperlipidemia, Hypogonadism male, Hypothyroidism, Internal hemorrhoid, and PVD (peripheral vascular disease) ([place]).   He has a past surgical history that includes Carpal tunnel release (8/11) and right ankle  (1976).   His family history includes Anxiety disorder in his father; Depression in his father; Diabetes in his brother and father; Lung cancer in his father; Peripheral vascular disease in his father.He reports that he  quit smoking about 23 years ago. He has a 30.00 pack-year smoking history. He has never used smokeless tobacco. He reports that he drinks alcohol. He reports that he does not use drugs.    ROS Review of Systems  Constitutional: Negative.   HENT: Negative.   Eyes: Negative for visual disturbance.  Respiratory: Negative for cough and shortness of breath.   Cardiovascular: Negative for chest pain and leg swelling.  Gastrointestinal: Negative for abdominal pain, diarrhea, nausea and vomiting.  Genitourinary: Negative for difficulty urinating.  Musculoskeletal: Negative for arthralgias and myalgias.  Skin: Negative for rash.  Neurological: Negative for headaches.  Psychiatric/Behavioral: Negative for sleep disturbance.    Objective:  BP 112/78   Pulse 81   Temp 97.9 F (36.6 C)   Ht _0  (1.753 m)   Wt 241 lb (109.3 kg)   BMI 35.59 kg/m   BP Readings from Last 3 Encounters:  05/04/18 112/78  03/29/18 129/89  02/07/18 111/73    Wt Readings from Last 3 Encounters:  05/04/18 241 lb (109.3 kg)  03/29/18 238 lb (108 kg)  02/07/18 222 lb 2 oz (100.8 kg)     Physical Exam  Constitutional: He is oriented to person, place, and time. He appears well-developed and well-nourished. No distress.  HENT:  Head: Normocephalic and atraumatic.  Right Ear: External ear normal.  Left Ear: External ear normal.  Nose: Nose normal.  Mouth/Throat: Oropharynx is clear and moist.  Eyes: Pupils are equal, round, and reactive to light. Conjunctivae and EOM are normal.  Neck: Normal range of motion. Neck supple.  Cardiovascular: Normal rate, regular rhythm and normal heart  sounds.  No murmur heard. Pulmonary/Chest: Effort normal and breath sounds normal. No respiratory distress. He has no wheezes. He has no rales.  Abdominal: Soft. There is no tenderness.  Musculoskeletal: Normal range of motion.  Neurological: He is alert and oriented to person, place, and time. He has normal reflexes.    Skin: Skin is warm and dry.  Psychiatric: He has a normal mood and affect. His behavior is normal. Judgment and thought content normal.      Assessment & Plan:   Cesar Leon was seen today for follow-up.  Diagnoses and all orders for this visit:  Mixed hyperlipidemia -     CBC with Differential -     CMP14+EGFR -     Lipid panel  Hypogonadism male  Spinal stenosis of lumbar region, unspecified whether neurogenic claudication present  Gastroesophageal reflux disease without esophagitis  Elevated fasting glucose -     CBC with Differential -     CMP14+EGFR -     Lipid panel  Non-seasonal allergic rhinitis due to pollen -     Discontinue: cetirizine (ZYRTEC) 10 MG tablet; Take 1 tablet (10 mg total) by mouth daily. -     cetirizine (ZYRTEC) 10 MG tablet; Take 1 tablet (10 mg total) by mouth daily.  Rash and nonspecific skin eruption -     Discontinue: cetirizine (ZYRTEC) 10 MG tablet; Take 1 tablet (10 mg total) by mouth daily. -     cetirizine (ZYRTEC) 10 MG tablet; Take 1 tablet (10 mg total) by mouth daily.  Other orders -     atorvastatin (LIPITOR) 40 MG tablet; TAKE ONE (1) TABLET EACH DAY -     diclofenac (VOLTAREN) 75 MG EC tablet; Take 1 tablet (75 mg total) by mouth 2 (two) times daily. -     fluticasone (FLONASE) 50 MCG/ACT nasal spray; USE 2 SPRAYS IN EACH NOSTRIL AT BEDTIME -     montelukast (SINGULAIR) 10 MG tablet; Take 1 tablet (10 mg total) by mouth at bedtime. -     omeprazole (PRILOSEC) 40 MG capsule; TAKE ONE (1) CAPSULE EACH DAY       I have changed Cesar Leon. [name] diclofenac. I am also having him maintain his Calcium Carb-Cholecalciferol (CALCIUM + D3 PO), Methylcellulose (Laxative), Multiple Vitamins-Minerals (CENTRUM SILVER PO), methocarbamol, Oxycodone HCl, buprenorphine, GAVILAX, atorvastatin, fluticasone, montelukast, omeprazole, and cetirizine. We administered testosterone cypionate. We will continue to administer testosterone  cypionate.  Allergies as of 05/04/2018      Reactions   Codeine    Penicillins       Medication List        Accurate as of 05/04/18  9:52 PM. Always use your most recent med list.          atorvastatin 40 MG tablet Commonly known as:  LIPITOR TAKE ONE (1) TABLET EACH DAY   buprenorphine 5 MCG/HR Ptwk patch Commonly known as:  BUTRANS - dosed mcg/hr APPLY 1 PATCH WEEKLY   CALCIUM + D3 PO Take by mouth.   CENTRUM SILVER PO Take 1 tablet by mouth daily.   cetirizine 10 MG tablet Commonly known as:  ZYRTEC Take 1 tablet (10 mg total) by mouth daily.   diclofenac 75 MG EC tablet Commonly known as:  VOLTAREN Take 1 tablet (75 mg total) by mouth 2 (two) times daily.   fluticasone 50 MCG/ACT nasal spray Commonly known as:  FLONASE USE 2 SPRAYS IN EACH NOSTRIL AT BEDTIME   GAVILAX powder Generic drug:  polyethylene glycol powder  MIX 17 GRAMS INTO 8OZ OF WATER AND DRINKDAILY   methocarbamol 500 MG tablet Commonly known as:  ROBAXIN   Methylcellulose (Laxative) 500 MG Tabs Take 2 tablets by mouth daily.   montelukast 10 MG tablet Commonly known as:  SINGULAIR Take 1 tablet (10 mg total) by mouth at bedtime.   omeprazole 40 MG capsule Commonly known as:  PRILOSEC TAKE ONE (1) CAPSULE EACH DAY   Oxycodone HCl 10 MG Tabs Take 1 tablet (10 mg total) by mouth 4 (four) times daily as needed (for severe pain only).        Follow-up: No follow-ups on file.  Claretta Fraise, M.D.

## 2018-05-05 LAB — LIPID PANEL
CHOL/HDL RATIO: 3.1 ratio (ref 0.0–5.0)
Cholesterol, Total: 117 mg/dL (ref 100–199)
HDL: 38 mg/dL — AB (ref >39)
LDL CALC: 59 mg/dL (ref 0–99)
TRIGLYCERIDES: 100 mg/dL (ref 0–149)
VLDL CHOLESTEROL CAL: 20 mg/dL (ref 5–40)

## 2018-05-05 LAB — CMP14+EGFR
A/G RATIO: 1.8 (ref 1.2–2.2)
ALT: 31 IU/L (ref 0–44)
AST: 17 IU/L (ref 0–40)
Albumin: 4.2 g/dL (ref 3.5–5.5)
Alkaline Phosphatase: 100 IU/L (ref 39–117)
BUN/Creatinine Ratio: 9 (ref 9–20)
BUN: 10 mg/dL (ref 6–24)
Bilirubin Total: 0.7 mg/dL (ref 0.0–1.2)
CALCIUM: 8.9 mg/dL (ref 8.7–10.2)
CO2: 26 mmol/L (ref 20–29)
CREATININE: 1.06 mg/dL (ref 0.76–1.27)
Chloride: 99 mmol/L (ref 96–106)
GFR, EST AFRICAN AMERICAN: 89 mL/min/{1.73_m2} (ref >59)
GFR, EST NON AFRICAN AMERICAN: 77 mL/min/{1.73_m2} (ref >59)
GLUCOSE: 132 mg/dL — AB (ref 65–99)
Globulin, Total: 2.3 g/dL (ref 1.5–4.5)
Potassium: 4.3 mmol/L (ref 3.5–5.2)
Sodium: 141 mmol/L (ref 134–144)
TOTAL PROTEIN: 6.5 g/dL (ref 6.0–8.5)

## 2018-05-05 LAB — CBC WITH DIFFERENTIAL/PLATELET
BASOS: 1 %
Basophils Absolute: 0 10*3/uL (ref 0.0–0.2)
EOS (ABSOLUTE): 0.1 10*3/uL (ref 0.0–0.4)
Eos: 2 %
Hematocrit: 49.3 % (ref 37.5–51.0)
Hemoglobin: 16.1 g/dL (ref 13.0–17.7)
IMMATURE GRANS (ABS): 0 10*3/uL (ref 0.0–0.1)
IMMATURE GRANULOCYTES: 0 %
LYMPHS: 30 %
Lymphocytes Absolute: 1.4 10*3/uL (ref 0.7–3.1)
MCH: 27.8 pg (ref 26.6–33.0)
MCHC: 32.7 g/dL (ref 31.5–35.7)
MCV: 85 fL (ref 79–97)
Monocytes Absolute: 0.5 10*3/uL (ref 0.1–0.9)
Monocytes: 11 %
NEUTROS PCT: 56 %
Neutrophils Absolute: 2.5 10*3/uL (ref 1.4–7.0)
PLATELETS: 228 10*3/uL (ref 150–450)
RBC: 5.79 x10E6/uL (ref 4.14–5.80)
RDW: 12.8 % (ref 12.3–15.4)
WBC: 4.5 10*3/uL (ref 3.4–10.8)

## 2018-05-07 ENCOUNTER — Ambulatory Visit: Payer: Federal, State, Local not specified - PPO | Admitting: Physical Therapy

## 2018-05-07 ENCOUNTER — Encounter: Payer: Self-pay | Admitting: Physical Therapy

## 2018-05-07 DIAGNOSIS — M545 Low back pain, unspecified: Secondary | ICD-10-CM

## 2018-05-07 DIAGNOSIS — M5442 Lumbago with sciatica, left side: Secondary | ICD-10-CM | POA: Diagnosis not present

## 2018-05-07 DIAGNOSIS — M6281 Muscle weakness (generalized): Secondary | ICD-10-CM | POA: Diagnosis not present

## 2018-05-07 DIAGNOSIS — R262 Difficulty in walking, not elsewhere classified: Secondary | ICD-10-CM | POA: Diagnosis not present

## 2018-05-07 DIAGNOSIS — G8929 Other chronic pain: Secondary | ICD-10-CM

## 2018-05-07 NOTE — Therapy (Signed)
[facility] [address] [place], [place], [address] Phone: [phone]   Fax:  [phone]  Physical Therapy Treatment  Patient Details  Name: Cesar Leon MRN: [id number] Date of Birth: 07-27-1960 Referring Provider (PT): Bolivar Haw PA   Encounter Date: 05/07/2018  PT End of Session - 05/07/18 0922    Visit Number  5    Number of Visits  16    Date for PT Re-Evaluation  07/16/18    Authorization Type  BCBS    PT Start Time  0859    PT Stop Time  0950    PT Time Calculation (min)  51 min    Activity Tolerance  Patient tolerated treatment well    Behavior During Therapy  [facility] for tasks assessed/performed       Past Medical History:  Diagnosis Date  . Allergic rhinitis   . Chronic pain   . DDD (degenerative disc disease) 12/28/08  . Femur fracture, right ([facility])   . GERD (gastroesophageal reflux disease)   . Hematuria   . History of GI bleed   . Hyperlipidemia   . Hypogonadism male   . Hypothyroidism   . Internal hemorrhoid   . PVD (peripheral vascular disease) ([facility])     Past Surgical History:  Procedure Laterality Date  . CARPAL TUNNEL RELEASE  8/11  . right ankle   1976    There were no vitals filed for this visit.  Subjective Assessment - 05/07/18 0919    Subjective  Pt arriving today to therapy reporting 1/10 pain in his low back. Pt still wearing his lumbar corset out in the community and when he is doing a lot of walking.     Pertinent History  DDD; right femoral fracture; CTS; right ankle surgery, s/p lumbar fusion on 01/15/18    Limitations  House hold activities;Other (comment);Lifting    How long can you walk comfortably?  I'm walking about 2 miles each day, pt reporting his left knee has been surgery,     Diagnostic tests  X-rays following surgery last one on 04/17/18.     Patient Stated Goals  Get back to work    Currently in Pain?  Yes    Pain Score  1     Pain Orientation  Lower    Pain  Descriptors / Indicators  Aching    Pain Type  Surgical pain    Pain Onset  More than a month ago    Pain Frequency  Constant    Aggravating Factors   Worse in the morning when first waking up, "when I bend it feels like it's pinching"    Pain Relieving Factors  resting                       [facility] Adult PT Treatment/Exercise - 05/07/18 0001      Exercises   Exercises  Lumbar;Knee/Hip      Lumbar Exercises: Aerobic   Nustep  Level 3 x 15 minutes.      Lumbar Exercises: Standing   Scapular Retraction  Strengthening;Both;20 reps;Limitations    Scapular Retraction Limitations  Pink XTS    Shoulder Extension  Strengthening;Both;20 reps    Shoulder Extension Limitations  Pink XTS      Lumbar Exercises: Supine   Glut Set  20 reps;5 seconds    Clam  20 reps;Limitations    Clam Limitations  green theraband    Bent Knee Raise  20 reps  Bridge  10 reps    Straight Leg Raise  10 reps    Other Supine Lumbar Exercises  SKTC x 30 second hold x 3 reps each side, DKTC x 30 seconds x 3 reps      Modalities   Modalities  Electrical Stimulation;Moist Heat      Moist Heat Therapy   Number Minutes Moist Heat  15 Minutes    Moist Heat Location  Lumbar Spine      Electrical Stimulation   Electrical Stimulation Location  bilateral lumbar paraspinals    Electrical Stimulation Action  IFC    Electrical Stimulation Parameters  80-150 Hz x 15 minutes, intensity to tolerance    Electrical Stimulation Goals  Pain               PT Short Term Goals - 05/07/18 0944      PT SHORT TERM GOAL #1   Title  Pt will be independent with proper sitting and standing posture.     Baseline  pt currently wearing a lumbar corset to maintain proper back posture.     Time  3    Period  Weeks    Status  On-going      PT SHORT TERM GOAL #2   Title  Pt will be independent in his initial HEP.     Baseline  issued sit to stand exercise on 04/23/18.     Period  Weeks    Status  On-going         PT Long Term Goals - 05/07/18 0924      PT LONG TERM GOAL #1   Title  Pt will be independent in HEP and progression    Time  8    Period  Weeks    Status  New      PT LONG TERM GOAL #2   Title  Pt able to demonstrate proper lifting body mechanics when lifting >/= 25# from floor.     Time  8    Period  Weeks    Status  New      PT LONG TERM GOAL #3   Title  Pt will improve his FOTO score from 74% limiation to 54% limitation.     Time  8    Period  Weeks    Status  New      PT LONG TERM GOAL #4   Title  Pt will improve his 5 time sit to stand </= 15 seconds in order to improve functional mobility.     Baseline  37.12 seconds with UE support on 04/23/18    Time  8    Period  Weeks    Status  New      PT LONG TERM GOAL #5   Title  Pt to report no pain when amb for 20 minutes community surfaces.     Time  8    Period  Weeks    Status  New            Plan - 05/07/18 1610    Clinical Impression Statement  Pt tolerating all exercises well. Pt progressing with reps and adding mini squats. Edu pt in proper lifting technique. Continue with skilled PT progressing pt toward his PLOF and back to work.     PT Frequency  2x / week    PT Duration  8 weeks    PT Treatment/Interventions  ADLs/Self Care Home Management;Cryotherapy;Electrical Stimulation;Gait training;Ultrasound;Moist Heat;Stair training;Functional mobility training;Therapeutic activities;Therapeutic exercise;Balance training;Neuromuscular re-education;Manual  techniques;Patient/family education;Passive range of motion;Taping    PT Next Visit Plan  core strengthening, lumbar stretching, LE strengthening, sit to stand, modaliites as needed. No lifting over 35# per MD (gradual progression), wean lumbar corset over the next 3-4 weeks    PT Home Exercise Plan  sit to stand, walking progression    Consulted and Agree with Plan of Care  Patient       Patient will benefit from skilled therapeutic intervention in  order to improve the following deficits and impairments:  Abnormal gait, Decreased activity tolerance, Decreased mobility, Decreased range of motion, Decreased strength, Postural dysfunction, Pain, Improper body mechanics, Difficulty walking  Visit Diagnosis: Chronic bilateral low back pain without sciatica  Muscle weakness (generalized)  Difficulty in walking, not elsewhere classified  Chronic midline low back pain with left-sided sciatica     Problem List Patient Active Problem List   Diagnosis Date Noted  . Elevated fasting glucose 09/28/2015  . Pain medication agreement completed 09/28/2015  . Opioid type dependence, continuous (HCC) 09/28/2015  . Gastroesophageal reflux disease without esophagitis 08/05/2015  . Spinal stenosis of lumbar region 04/29/2015  . Hypogonadism male 09/22/2014  . Hyperlipemia 09/22/2014    Sharmon Leyden, PT  05/07/2018, 9:44 AM  [facility] [address] [place], [place], [address] Phone: [phone]   Fax:  [phone]  Name: ELIZARDO CHILSON MRN: [id number] Date of Birth: 07/08/1960

## 2018-05-09 LAB — SPECIMEN STATUS REPORT

## 2018-05-09 LAB — HGB A1C W/O EAG: Hgb A1c MFr Bld: 7 % — ABNORMAL HIGH (ref 4.8–5.6)

## 2018-05-10 ENCOUNTER — Ambulatory Visit: Payer: Federal, State, Local not specified - PPO | Admitting: Physical Therapy

## 2018-05-10 ENCOUNTER — Encounter: Payer: Self-pay | Admitting: Physical Therapy

## 2018-05-10 DIAGNOSIS — R262 Difficulty in walking, not elsewhere classified: Secondary | ICD-10-CM | POA: Diagnosis not present

## 2018-05-10 DIAGNOSIS — M6281 Muscle weakness (generalized): Secondary | ICD-10-CM | POA: Diagnosis not present

## 2018-05-10 DIAGNOSIS — G8929 Other chronic pain: Secondary | ICD-10-CM | POA: Diagnosis not present

## 2018-05-10 DIAGNOSIS — M5442 Lumbago with sciatica, left side: Secondary | ICD-10-CM | POA: Diagnosis not present

## 2018-05-10 DIAGNOSIS — M545 Low back pain: Principal | ICD-10-CM

## 2018-05-10 NOTE — Therapy (Signed)
[facility] [address] [place], [place], [address] Phone: [phone]   Fax:  [phone]  Physical Therapy Treatment  Patient Details  Name: Cesar Leon MRN: [id number] Date of Birth: [birth year] Referring Provider (PT): Bolivar HawVanDeMoere, Al[place] PA   Encounter Date: 05/10/2018  PT End of Session - 05/10/18 0917    Visit Number  6    Number of Visits  16    Date for PT Re-Evaluation  07/16/18    Authorization Type  BCBS    PT Start Time  0901    PT Stop Time  0950    PT Time Calculation (min)  49 min    Activity Tolerance  Patient tolerated treatment well    Behavior During Therapy  [facility] for tasks assessed/performed       Past Medical History:  Diagnosis Date  . Allergic rhinitis   . Chronic pain   . DDD (degenerative disc disease) 12/28/08  . Femur fracture, right (HCC)   . GERD (gastroesophageal reflux disease)   . Hematuria   . History of GI bleed   . Hyperlipidemia   . Hypogonadism male   . Hypothyroidism   . Internal hemorrhoid   . PVD (peripheral vascular disease) ([facility])     Past Surgical History:  Procedure Laterality Date  . CARPAL TUNNEL RELEASE  8/11  . right ankle   1976    There were no vitals filed for this visit.  Subjective Assessment - 05/10/18 0916    Subjective  Reports soreness in low back.    Pertinent History  DDD; right femoral fracture; CTS; right ankle surgery, s/p lumbar fusion on 01/15/18    Limitations  House hold activities;Other (comment);Lifting    How long can you walk comfortably?  I'm walking about 2 miles each day, pt reporting his left knee has been surgery,     Diagnostic tests  X-rays following surgery last one on 04/17/18.     Patient Stated Goals  Get back to work    Currently in Pain?  Yes    Pain Score  3     Pain Location  Back    Pain Orientation  Lower    Pain Descriptors / Indicators  Sore    Pain Type  Surgical pain    Pain Onset  More than a month ago          [facility] - 05/10/18 0001      Assessment   Medical Diagnosis  s/p lumbar fusion    Referring Provider (PT)  Bolivar HawVanDeMoere, Alison PA    Onset Date/Surgical Date  01/15/18    Next MD Visit  07/18/2017      Precautions   Precautions  Back    Precaution Comments  no lifting over 35#    Required Braces or Orthoses  Other Brace/Splint    Other Brace/Splint  lumbar corset as needed      Restrictions   Weight Bearing Restrictions  No                   [facility] Adult PT Treatment/Exercise - 05/10/18 0001      Exercises   Exercises  Lumbar;Knee/Hip      Lumbar Exercises: Aerobic   Nustep  L6 x15 min      Lumbar Exercises: Standing   Scapular Retraction  Strengthening;Both;20 reps;Limitations    Scapular Retraction Limitations  Pink XTS    Shoulder Extension  Strengthening;Both;20 reps    Shoulder Extension Limitations  Pink XTS      Lumbar Exercises: Supine   Ab Set  20 reps;5 seconds    Glut Set  20 reps;5 seconds    Clam  20 reps;3 seconds    Clam Limitations  Red theraband    Bent Knee Raise  20 reps    Bridge  10 reps;3 seconds    Straight Leg Raise  10 reps      Modalities   Modalities  Electrical Stimulation;Moist Heat      Moist Heat Therapy   Number Minutes Moist Heat  15 Minutes    Moist Heat Location  Lumbar Spine      Electrical Stimulation   Electrical Stimulation Location  bilateral lumbar paraspinals    Electrical Stimulation Action  IFC    Electrical Stimulation Parameters  80-150 hz x15 min    Electrical Stimulation Goals  Pain               PT Short Term Goals - 05/10/18 0957      PT SHORT TERM GOAL #1   Title  Pt will be independent with proper sitting and standing posture.     Baseline  pt currently wearing a lumbar corset to maintain proper back posture.     Time  3    Period  Weeks    Status  On-going      PT SHORT TERM GOAL #2   Title  Pt will be independent in his initial HEP.     Baseline  issued sit to  stand exercise on 04/23/18.     Period  Weeks    Status  Achieved        PT Long Term Goals - 05/07/18 1610      PT LONG TERM GOAL #1   Title  Pt will be independent in HEP and progression    Time  8    Period  Weeks    Status  New      PT LONG TERM GOAL #2   Title  Pt able to demonstrate proper lifting body mechanics when lifting >/= 25# from floor.     Time  8    Period  Weeks    Status  New      PT LONG TERM GOAL #3   Title  Pt will improve his FOTO score from 74% limiation to 54% limitation.     Time  8    Period  Weeks    Status  New      PT LONG TERM GOAL #4   Title  Pt will improve his 5 time sit to stand </= 15 seconds in order to improve functional mobility.     Baseline  37.12 seconds with UE support on 04/23/18    Time  8    Period  Weeks    Status  New      PT LONG TERM GOAL #5   Title  Pt to report no pain when amb for 20 minutes community surfaces.     Time  8    Period  Weeks    Status  New            Plan - 05/10/18 9604    Clinical Impression Statement  Patient arrived with lumbar corset donned and with only reports of low level low back soreness. Patient guided through core/lumbar strengthening with VCs for audible counting to ensure with core activation that he is not completing valsava maneuver. No complaints of any pain  with therex just tightmess specifically with bridges. Patient reports since being told to wean off the brace that he does not wear it around his home. Normal modalities response noted following removal of the modalities.    Rehab Potential  Good    PT Frequency  2x / week    PT Duration  8 weeks    PT Treatment/Interventions  ADLs/Self Care Home Management;Cryotherapy;Electrical Stimulation;Gait training;Ultrasound;Moist Heat;Stair training;Functional mobility training;Therapeutic activities;Therapeutic exercise;Balance training;Neuromuscular re-education;Manual techniques;Patient/family education;Passive range of motion;Taping     PT Next Visit Plan  Patient interested in therex without lumbar corset.    PT Home Exercise Plan  sit to stand, walking progression    Consulted and Agree with Plan of Care  Patient       Patient will benefit from skilled therapeutic intervention in order to improve the following deficits and impairments:  Abnormal gait, Decreased activity tolerance, Decreased mobility, Decreased range of motion, Decreased strength, Postural dysfunction, Pain, Improper body mechanics, Difficulty walking  Visit Diagnosis: Chronic bilateral low back pain without sciatica  Muscle weakness (generalized)  Difficulty in walking, not elsewhere classified     Problem List Patient Active Problem List   Diagnosis Date Noted  . Elevated fasting glucose 09/28/2015  . Pain medication agreement completed 09/28/2015  . Opioid type dependence, continuous (HCC) 09/28/2015  . Gastroesophageal reflux disease without esophagitis 08/05/2015  . Spinal stenosis of lumbar region 04/29/2015  . Hypogonadism male 09/22/2014  . Hyperlipemia 09/22/2014    Marvell Fuller, PTA 05/10/2018, 10:00 AM  [facility] [address] [place], [place], [address] Phone: [phone]   Fax:  [phone]  Name: Cesar Leon MRN: [id number] Date of Birth: 1961-04-06

## 2018-05-11 ENCOUNTER — Ambulatory Visit: Payer: Federal, State, Local not specified - PPO | Admitting: Family Medicine

## 2018-05-14 ENCOUNTER — Encounter: Payer: Self-pay | Admitting: Physical Therapy

## 2018-05-14 ENCOUNTER — Ambulatory Visit: Payer: Federal, State, Local not specified - PPO | Admitting: Physical Therapy

## 2018-05-14 DIAGNOSIS — M6281 Muscle weakness (generalized): Secondary | ICD-10-CM | POA: Diagnosis not present

## 2018-05-14 DIAGNOSIS — R262 Difficulty in walking, not elsewhere classified: Secondary | ICD-10-CM | POA: Diagnosis not present

## 2018-05-14 DIAGNOSIS — M5442 Lumbago with sciatica, left side: Secondary | ICD-10-CM | POA: Diagnosis not present

## 2018-05-14 DIAGNOSIS — G8929 Other chronic pain: Secondary | ICD-10-CM

## 2018-05-14 DIAGNOSIS — M545 Low back pain: Principal | ICD-10-CM

## 2018-05-14 NOTE — Therapy (Signed)
[facility] [place], [place], [address] Phone: [phone]   Fax:  [phone]  Physical Therapy Treatment  Patient Details  Name: Cesar Leon MRN: [id number] Date of Birth: 04-29-1961 Referring Provider (PT): Grier Rocher PA   Encounter Date: 05/14/2018  PT End of Session - 05/14/18 0931    Visit Number  7    Number of Visits  16    Date for PT Re-Evaluation  07/16/18    Authorization Type  BCBS    PT Start Time  0901    PT Stop Time  0953    PT Time Calculation (min)  52 min    Activity Tolerance  Patient tolerated treatment well    Behavior During Therapy  [facility] for tasks assessed/performed       Past Medical History:  Diagnosis Date  . Allergic rhinitis   . Chronic pain   . DDD (degenerative disc disease) 12/28/08  . Femur fracture, right ([place])   . GERD (gastroesophageal reflux disease)   . Hematuria   . History of GI bleed   . Hyperlipidemia   . Hypogonadism male   . Hypothyroidism   . Internal hemorrhoid   . PVD (peripheral vascular disease) ([place])     Past Surgical History:  Procedure Laterality Date  . CARPAL TUNNEL RELEASE  8/11  . right ankle   1976    There were no vitals filed for this visit.  Subjective Assessment - 05/14/18 0916    Subjective  Patient arrived with no brace today and ongoing discomfort    Pertinent History  DDD; right femoral fracture; CTS; right ankle surgery, s/p lumbar fusion on 01/15/18    Limitations  House hold activities;Other (comment);Lifting    How long can you walk comfortably?  I'm walking about 2 miles each day, pt reporting his left knee has been surgery,     Diagnostic tests  X-rays following surgery last one on 04/17/18.     Patient Stated Goals  Get back to work    Currently in Pain?  Yes    Pain Score  3     Pain Location  Back    Pain Orientation  Lower    Pain Descriptors / Indicators  Sore;Discomfort    Pain Type  Surgical pain    Pain  Onset  More than a month ago    Pain Frequency  Constant    Aggravating Factors   Pain in AM and with certain movements such as bending     Pain Relieving Factors  at rest                        [facility] Adult PT Treatment/Exercise - 05/14/18 0001      Exercises   Exercises  Lumbar;Knee/Hip      Lumbar Exercises: Aerobic   Nustep  L6 x15 min      Lumbar Exercises: Standing   Scapular Retraction  Strengthening;Both;20 reps;Limitations;15 reps;5 reps    Scapular Retraction Limitations  Pink XTS    Shoulder Extension  Strengthening;Both;20 reps;15 reps;5 reps    Shoulder Extension Limitations  pink XTS      Lumbar Exercises: Supine   Ab Set  20 reps;5 seconds    Glut Set  20 reps;5 seconds    Clam  20 reps;3 seconds    Clam Limitations  Red theraband    Bent Knee Raise  20 reps    Bridge with [employer]  20 reps    Straight Leg Raise  3 seconds   2x10     Knee/Hip Exercises: Seated   Other Seated Knee/Hip Exercises  seated core activation with 2# reach outs and diagnols 2x10 each way      Moist Heat Therapy   Number Minutes Moist Heat  15 Minutes    Moist Heat Location  Lumbar Spine      Electrical Stimulation   Electrical Stimulation Location  bilateral lumbar paraspinals    Electrical Stimulation Action  IFC    Electrical Stimulation Parameters  80-'150hz'$  x33mn    Electrical Stimulation Goals  Pain               PT Short Term Goals - 05/14/18 0928      PT SHORT TERM GOAL #1   Title  Pt will be independent with proper sitting and standing posture.     Baseline  pt currently wearing a lumbar corset to maintain proper back posture.     Time  3    Period  Weeks    Status  Achieved   Patient not wearing brace as much and able to maintain proper standing posture 05/14/18     PT SHORT TERM GOAL #2   Title  Pt will be independent in his initial HEP.     Time  4    Period  Weeks    Status  Achieved        PT Long Term Goals - 05/14/18 0929       PT LONG TERM GOAL #1   Title  Pt will be independent in HEP and progression    Time  8    Period  Weeks    Status  On-going      PT LONG TERM GOAL #2   Title  Pt able to demonstrate proper lifting body mechanics when lifting >/= 25# from floor.     Time  8    Period  Weeks    Status  On-going      PT LONG TERM GOAL #3   Title  Pt will improve his FOTO score from 74% limiation to 54% limitation.     Time  8    Period  Weeks    Status  On-going      PT LONG TERM GOAL #4   Title  Pt will improve his 5 time sit to stand </= 15 seconds in order to improve functional mobility.     Baseline  37.12 seconds with UE support on 04/23/18    Time  8    Period  Weeks    Status  On-going      PT LONG TERM GOAL #5   Title  Pt to report no pain when amb for 20 minutes community surfaces.     Time  8    Period  Weeks    Status  On-going            Plan - 05/14/18 0941    Clinical Impression Statement  Patient tolerated treatment well today. Patient able to progress with exercises slowly with no increased discomfort. Patient reported doing self core activation exercises daily and doing well with them. Patient progressing toward goals. Patient able to maintain good upright posture with standing and sitting today. Met STG and LTG's ongoing.     Rehab Potential  Good    PT Frequency  2x / week    PT Duration  8 weeks    PT  Treatment/Interventions  ADLs/Self Care Home Management;Cryotherapy;Electrical Stimulation;Gait training;Ultrasound;Moist Heat;Stair training;Functional mobility training;Therapeutic activities;Therapeutic exercise;Balance training;Neuromuscular re-education;Manual techniques;Patient/family education;Passive range of motion;Taping    PT Next Visit Plan  Cont to progress per tolerance and slow progression    Consulted and Agree with Plan of Care  Patient       Patient will benefit from skilled therapeutic intervention in order to improve the following deficits and  impairments:  Abnormal gait, Decreased activity tolerance, Decreased mobility, Decreased range of motion, Decreased strength, Postural dysfunction, Pain, Improper body mechanics, Difficulty walking  Visit Diagnosis: Chronic bilateral low back pain without sciatica  Muscle weakness (generalized)  Difficulty in walking, not elsewhere classified  Chronic midline low back pain with left-sided sciatica     Problem List Patient Active Problem List   Diagnosis Date Noted  . Elevated fasting glucose 09/28/2015  . Pain medication agreement completed 09/28/2015  . Opioid type dependence, continuous ([place]) 09/28/2015  . Gastroesophageal reflux disease without esophagitis 08/05/2015  . Spinal stenosis of lumbar region 04/29/2015  . Hypogonadism male 09/22/2014  . Hyperlipemia 09/22/2014    Pink Maye P, PTA 05/14/2018, 9:57 AM  [facility], [place], [address] Phone: [phone]   Fax:  [phone]  Name: ISADOR CASTILLE MRN: [id number] Date of Birth: May 18, 1961

## 2018-05-17 ENCOUNTER — Ambulatory Visit: Payer: Federal, State, Local not specified - PPO | Admitting: Physical Therapy

## 2018-05-17 ENCOUNTER — Encounter: Payer: Self-pay | Admitting: Physical Therapy

## 2018-05-17 DIAGNOSIS — M5442 Lumbago with sciatica, left side: Secondary | ICD-10-CM | POA: Diagnosis not present

## 2018-05-17 DIAGNOSIS — R262 Difficulty in walking, not elsewhere classified: Secondary | ICD-10-CM

## 2018-05-17 DIAGNOSIS — M6281 Muscle weakness (generalized): Secondary | ICD-10-CM

## 2018-05-17 DIAGNOSIS — M545 Low back pain: Secondary | ICD-10-CM | POA: Diagnosis not present

## 2018-05-17 DIAGNOSIS — G8929 Other chronic pain: Secondary | ICD-10-CM

## 2018-05-17 NOTE — Therapy (Signed)
[facility] [address] [place], [place], [address] Phone: [phone]   Fax:  [phone]  Physical Therapy Treatment  Patient Details  Name: Cesar Leon MRN: [id number] Date of Birth: 1961-05-13 Referring Provider (PT): Bolivar Haw PA   Encounter Date: 05/17/2018  PT End of Session - 05/17/18 0944    Visit Number  8    Number of Visits  16    Date for PT Re-Evaluation  07/16/18    Authorization Type  BCBS    PT Start Time  0901    PT Stop Time  0956    PT Time Calculation (min)  55 min    Activity Tolerance  Patient tolerated treatment well    Behavior During Therapy  [facility] for tasks assessed/performed       Past Medical History:  Diagnosis Date  . Allergic rhinitis   . Chronic pain   . DDD (degenerative disc disease) 12/28/08  . Femur fracture, right ([facility])   . GERD (gastroesophageal reflux disease)   . Hematuria   . History of GI bleed   . Hyperlipidemia   . Hypogonadism male   . Hypothyroidism   . Internal hemorrhoid   . PVD (peripheral vascular disease) ([facility])     Past Surgical History:  Procedure Laterality Date  . CARPAL TUNNEL RELEASE  8/11  . right ankle   1976    There were no vitals filed for this visit.  Subjective Assessment - 05/17/18 0921    Subjective  Patient reported doing well without brace and good after last treatment    Pertinent History  DDD; right femoral fracture; CTS; right ankle surgery, s/p lumbar fusion on 01/15/18    Limitations  House hold activities;Other (comment);Lifting    How long can you walk comfortably?  I'm walking about 2 miles each day, pt reporting his left knee has been surgery,     Diagnostic tests  X-rays following surgery last one on 04/17/18.     Patient Stated Goals  Get back to work    Currently in Pain?  Yes    Pain Score  2     Pain Location  Back    Pain Orientation  Lower    Pain Descriptors / Indicators  Discomfort;Sore    Pain Type  Surgical pain     Pain Onset  More than a month ago    Pain Frequency  Constant    Aggravating Factors   certain movements    Pain Relieving Factors  at rest                       [facility] Adult PT Treatment/Exercise - 05/17/18 0001      Exercises   Exercises  Lumbar;Knee/Hip      Lumbar Exercises: Aerobic   Nustep  L6 x15 min      Lumbar Exercises: Standing   Scapular Retraction  Strengthening;Both;20 reps;Limitations;15 reps;5 reps    Scapular Retraction Limitations  Pink XTS    Shoulder Extension  Strengthening;Both;20 reps;15 reps;5 reps    Shoulder Extension Limitations  pink XTS    Other Standing Lumbar Exercises  standing 2# reachouts/ nd diagnols 2x10      Lumbar Exercises: Seated   Sit to Stand  20 reps;5 reps      Lumbar Exercises: Supine   Clam  20 reps;3 seconds;10 reps    Clam Limitations  green t-band    Bent Knee Raise  20 reps  Bridge with [employer]  20 reps    Straight Leg Raise  3 seconds   2x10     Moist Heat Therapy   Number Minutes Moist Heat  15 Minutes    Moist Heat Location  Lumbar Spine      Electrical Stimulation   Electrical Stimulation Location  bilateral lumbar paraspinals    Electrical Stimulation Action  IFC    Electrical Stimulation Parameters  80-150hz  x92min    Electrical Stimulation Goals  Pain               PT Short Term Goals - 05/14/18 1610      PT SHORT TERM GOAL #1   Title  Pt will be independent with proper sitting and standing posture.     Baseline  pt currently wearing a lumbar corset to maintain proper back posture.     Time  3    Period  Weeks    Status  Achieved   Patient not wearing brace as much and able to maintain proper standing posture 05/14/18     PT SHORT TERM GOAL #2   Title  Pt will be independent in his initial HEP.     Time  4    Period  Weeks    Status  Achieved        PT Long Term Goals - 05/14/18 0929      PT LONG TERM GOAL #1   Title  Pt will be independent in HEP and progression     Time  8    Period  Weeks    Status  On-going      PT LONG TERM GOAL #2   Title  Pt able to demonstrate proper lifting body mechanics when lifting >/= 25# from floor.     Time  8    Period  Weeks    Status  On-going      PT LONG TERM GOAL #3   Title  Pt will improve his FOTO score from 74% limiation to 54% limitation.     Time  8    Period  Weeks    Status  On-going      PT LONG TERM GOAL #4   Title  Pt will improve his 5 time sit to stand </= 15 seconds in order to improve functional mobility.     Baseline  37.12 seconds with UE support on 04/23/18    Time  8    Period  Weeks    Status  On-going      PT LONG TERM GOAL #5   Title  Pt to report no pain when amb for 20 minutes community surfaces.     Time  8    Period  Weeks    Status  On-going            Plan - 05/17/18 9604    Clinical Impression Statement  Patient progressing with core activation exercises and able to progress with reps and resistance today. Patient doing well without brace and reports minimal discomfort. Patient doing well with all exercises and posture techniques. Patient able to perform sit to stand yet a slow controlled movement at this time. Goals progressing.     Rehab Potential  Good    PT Frequency  2x / week    PT Duration  8 weeks    PT Treatment/Interventions  ADLs/Self Care Home Management;Cryotherapy;Electrical Stimulation;Gait training;Ultrasound;Moist Heat;Stair training;Functional mobility training;Therapeutic activities;Therapeutic exercise;Balance training;Neuromuscular re-education;Manual techniques;Patient/family education;Passive range of motion;Taping  PT Next Visit Plan  Cont to progress per tolerance and slow progression may consider starting lifting with proper technique next visit    Consulted and Agree with Plan of Care  Patient       Patient will benefit from skilled therapeutic intervention in order to improve the following deficits and impairments:  Abnormal gait,  Decreased activity tolerance, Decreased mobility, Decreased range of motion, Decreased strength, Postural dysfunction, Pain, Improper body mechanics, Difficulty walking  Visit Diagnosis: Chronic bilateral low back pain without sciatica  Muscle weakness (generalized)  Difficulty in walking, not elsewhere classified     Problem List Patient Active Problem List   Diagnosis Date Noted  . Elevated fasting glucose 09/28/2015  . Pain medication agreement completed 09/28/2015  . Opioid type dependence, continuous ([facility]) 09/28/2015  . Gastroesophageal reflux disease without esophagitis 08/05/2015  . Spinal stenosis of lumbar region 04/29/2015  . Hypogonadism male 09/22/2014  . Hyperlipemia 09/22/2014    Angi Goodell P, PTA 05/17/2018, 10:20 AM  [facility] [address] [place], [place], [address] Phone: [phone]   Fax:  [phone]  Name: Hillery AldoMichael D Loescher MRN: [id number] Date of Birth: [birth year]

## 2018-05-18 ENCOUNTER — Ambulatory Visit (INDEPENDENT_AMBULATORY_CARE_PROVIDER_SITE_OTHER): Payer: Federal, State, Local not specified - PPO | Admitting: *Deleted

## 2018-05-18 DIAGNOSIS — E349 Endocrine disorder, unspecified: Secondary | ICD-10-CM | POA: Diagnosis not present

## 2018-05-18 NOTE — Progress Notes (Signed)
Pt given Testosterone inj Tolerated well 

## 2018-05-21 ENCOUNTER — Encounter: Payer: Self-pay | Admitting: Physical Therapy

## 2018-05-21 ENCOUNTER — Ambulatory Visit: Payer: Federal, State, Local not specified - PPO | Admitting: Physical Therapy

## 2018-05-21 DIAGNOSIS — R262 Difficulty in walking, not elsewhere classified: Secondary | ICD-10-CM | POA: Diagnosis not present

## 2018-05-21 DIAGNOSIS — G8929 Other chronic pain: Secondary | ICD-10-CM | POA: Diagnosis not present

## 2018-05-21 DIAGNOSIS — M5442 Lumbago with sciatica, left side: Secondary | ICD-10-CM

## 2018-05-21 DIAGNOSIS — M545 Low back pain, unspecified: Secondary | ICD-10-CM

## 2018-05-21 DIAGNOSIS — M6281 Muscle weakness (generalized): Secondary | ICD-10-CM | POA: Diagnosis not present

## 2018-05-21 NOTE — Therapy (Signed)
[facility] [address] [place], [place], [address] Phone: [phone]   Fax:  [phone]  Physical Therapy Treatment  Patient Details  Name: Cesar Leon MRN: [id number] Date of Birth: 1960/11/05 Referring Provider (PT): Bolivar Haw PA   Encounter Date: 05/21/2018  PT End of Session - 05/21/18 0924    Visit Number  9    Number of Visits  16    Date for PT Re-Evaluation  07/16/18    Authorization Type  BCBS    PT Start Time  0900    PT Stop Time  0957    PT Time Calculation (min)  57 min    Activity Tolerance  Patient tolerated treatment well    Behavior During Therapy  [facility] for tasks assessed/performed       Past Medical History:  Diagnosis Date  . Allergic rhinitis   . Chronic pain   . DDD (degenerative disc disease) 12/28/08  . Femur fracture, right ([facility])   . GERD (gastroesophageal reflux disease)   . Hematuria   . History of GI bleed   . Hyperlipidemia   . Hypogonadism male   . Hypothyroidism   . Internal hemorrhoid   . PVD (peripheral vascular disease) ([facility])     Past Surgical History:  Procedure Laterality Date  . CARPAL TUNNEL RELEASE  8/11  . right ankle   1976    There were no vitals filed for this visit.  Subjective Assessment - 05/21/18 0905    Subjective  Patient reported doing well today, some discomfort ongoing , yet walked a lot over weekend    Pertinent History  DDD; right femoral fracture; CTS; right ankle surgery, s/p lumbar fusion on 01/15/18    Limitations  House hold activities;Other (comment);Lifting    How long can you walk comfortably?  I'm walking about 2 miles each day, pt reporting his left knee has been surgery,     Diagnostic tests  X-rays following surgery last one on 04/17/18.     Patient Stated Goals  Get back to work    Currently in Pain?  Yes    Pain Score  3     Pain Location  Back    Pain Orientation  Lower    Pain Descriptors / Indicators  Discomfort    Pain Type   Surgical pain    Pain Onset  More than a month ago    Pain Frequency  Constant    Aggravating Factors   prolong activity, certain movements    Pain Relieving Factors  at rest                       [facility] Adult PT Treatment/Exercise - 05/21/18 0001      Exercises   Exercises  Lumbar;Knee/Hip      Lumbar Exercises: Aerobic   Nustep  L6 x15 min      Lumbar Exercises: Standing   Scapular Retraction  Strengthening;Both;20 reps;Limitations;15 reps;5 reps    Scapular Retraction Limitations  orange XTS    Shoulder Extension  Strengthening;Both;20 reps;15 reps;5 reps    Shoulder Extension Limitations  orange XTS    Other Standing Lumbar Exercises  standing 4# reachouts/  diagnols 2x10      Lumbar Exercises: Seated   Sit to Stand  20 reps;5 reps      Lumbar Exercises: Supine   Clam  20 reps;3 seconds;10 reps    Clam Limitations  green t-band    Bent Knee  Raise  20 reps    Bridge with [employer] S[employer]  20 reps    Straight Leg Raise  3 seconds;20 reps      Knee/Hip Exercises: Standing   Other Standing Knee Exercises  Power lift from waist to floor with red swiss ball x 10, eductional and visual demo cues for correct technique      Moist Heat Therapy   Number Minutes Moist Heat  15 Minutes    Moist Heat Location  Lumbar Spine      Electrical Stimulation   Electrical Stimulation Location  bilateral lumbar paraspinals    Electrical Stimulation Action  IFC    Electrical Stimulation Parameters  80-150hz  x2515min    Electrical Stimulation Goals  Pain               PT Short Term Goals - 05/14/18 [id number]      PT SHORT TERM GOAL #1   Title  Pt will be independent with proper sitting and standing posture.     Baseline  pt currently wearing a lumbar corset to maintain proper back posture.     Time  3    Period  Weeks    Status  Achieved   Patient not wearing brace as much and able to maintain proper standing posture 05/14/18     PT SHORT TERM GOAL #2   Title  Pt will  be independent in his initial HEP.     Time  4    Period  Weeks    Status  Achieved        PT Long Term Goals - 05/21/18 0932      PT LONG TERM GOAL #1   Title  Pt will be independent in HEP and progression    Time  8    Period  Weeks    Status  On-going      PT LONG TERM GOAL #2   Title  Pt able to demonstrate proper lifting body mechanics when lifting >/= 25# from floor.     Time  8    Period  Weeks    Status  On-going   started lifting technique today with light weight ball from floor to waist 05/21/18     PT LONG TERM GOAL #3   Title  Pt will improve his FOTO score from 74% limiation to 54% limitation.     Time  8    Period  Weeks    Status  On-going      PT LONG TERM GOAL #4   Title  Pt will improve his 5 time sit to stand </= 15 seconds in order to improve functional mobility.     Baseline  37.12 seconds with UE support on 04/23/18    Time  8    Period  Weeks    Status  On-going      PT LONG TERM GOAL #5   Title  Pt to report no pain when amb for 20 minutes community surfaces.     Time  8    Period  Weeks    Status  On-going            Plan - 05/21/18 0934    Clinical Impression Statement  Patient tolerated treatment well today. Patient reported no increased discomfort with exercises today. Patient able to progress with resistance bands today and started power lift from waist to floor with light weight ball. Patient able to demonstrate good posture with technique today.  Patient progressing toward  goals.     Rehab Potential  Good    PT Frequency  2x / week    PT Duration  8 weeks    PT Treatment/Interventions  ADLs/Self Care Home Management;Cryotherapy;Electrical Stimulation;Gait training;Ultrasound;Moist Heat;Stair training;Functional mobility training;Therapeutic activities;Therapeutic exercise;Balance training;Neuromuscular re-education;Manual techniques;Patient/family education;Passive range of motion;Taping    PT Next Visit Plan  Cont to progress per  tolerance and slow progression     Consulted and Agree with Plan of Care  Patient       Patient will benefit from skilled therapeutic intervention in order to improve the following deficits and impairments:  Abnormal gait, Decreased activity tolerance, Decreased mobility, Decreased range of motion, Decreased strength, Postural dysfunction, Pain, Improper body mechanics, Difficulty walking  Visit Diagnosis: Chronic bilateral low back pain without sciatica  Difficulty in walking, not elsewhere classified  Muscle weakness (generalized)  Chronic midline low back pain with left-sided sciatica     Problem List Patient Active Problem List   Diagnosis Date Noted  . Elevated fasting glucose 09/28/2015  . Pain medication agreement completed 09/28/2015  . Opioid type dependence, continuous ([facility]) 09/28/2015  . Gastroesophageal reflux disease without esophagitis 08/05/2015  . Spinal stenosis of lumbar region 04/29/2015  . Hypogonadism male 09/22/2014  . Hyperlipemia 09/22/2014    Love Milbourne P, PTA 05/21/2018, 10:03 AM  [facility] [address] [place], [place], [address] Phone: [phone]   Fax:  [phone]  Name: TORAN MURCH MRN: [id number] Date of Birth: 1960-08-26

## 2018-05-22 ENCOUNTER — Ambulatory Visit: Payer: Federal, State, Local not specified - PPO | Admitting: Family Medicine

## 2018-05-22 ENCOUNTER — Encounter: Payer: Self-pay | Admitting: Family Medicine

## 2018-05-22 VITALS — BP 128/76 | HR 88 | Temp 97.0°F | Ht 69.0 in | Wt 239.0 lb

## 2018-05-22 DIAGNOSIS — E119 Type 2 diabetes mellitus without complications: Secondary | ICD-10-CM | POA: Diagnosis not present

## 2018-05-22 DIAGNOSIS — F1120 Opioid dependence, uncomplicated: Secondary | ICD-10-CM

## 2018-05-22 MED ORDER — FLUTICASONE PROPIONATE 50 MCG/ACT NA SUSP: NASAL | 5 refills | Status: DC

## 2018-05-22 MED ORDER — METFORMIN HCL ER 500 MG PO TB24: 500.0000 mg | ORAL_TABLET | Freq: Every day | ORAL | 2 refills | Status: DC

## 2018-05-22 MED ORDER — OMEPRAZOLE 40 MG PO CPDR: DELAYED_RELEASE_CAPSULE | ORAL | 1 refills | Status: DC

## 2018-05-22 MED ORDER — BUPRENORPHINE HCL 2 MG SL SUBL: 2.0000 mg | SUBLINGUAL_TABLET | Freq: Every day | SUBLINGUAL | 0 refills | Status: DC

## 2018-05-22 MED ORDER — ATORVASTATIN CALCIUM 40 MG PO TABS: ORAL_TABLET | ORAL | 1 refills | Status: DC

## 2018-05-22 NOTE — Progress Notes (Signed)
Subjective:  Patient ID: Cesar Leon, male    DOB: [birth year]  Age: 57 y.o. MRN: [id number]  CC: Medical Management of Chronic Issues   HPI Cesar Leon presents to continue tapering off of buprenorphine. He has been pain free on the current dose using the 5 mcg patch.  Additionally, he is in for discussion of the elevated A1c from last visit visit. Diagnostic for diabetes. Pt. Notes that it runs in the family. He denies symptoms of high glucose such as polyuria, polydipsia and nausea.   Depression screen [facility] 2/9 05/22/2018 05/04/2018 03/29/2018  Decreased Interest 0 0 1  Down, Depressed, Hopeless 0 0 0  PHQ - 2 Score 0 0 1    History Cesar Leon has a past medical history of Allergic rhinitis, Chronic pain, DDD (degenerative disc disease) (12/28/08), Femur fracture, right (HCC), GERD (gastroesophageal reflux disease), Hematuria, History of GI bleed, Hyperlipidemia, Hypogonadism male, Hypothyroidism, Internal hemorrhoid, and PVD (peripheral vascular disease) (HCC).   He has a past surgical history that includes Carpal tunnel release (8/11) and right ankle  (1976).   His family history includes Anxiety disorder in his father; Depression in his father; Diabetes in his brother and father; Lung cancer in his father; Peripheral vascular disease in his father.He reports that he quit smoking about 23 years ago. He has a 30.00 pack-year smoking history. He has never used smokeless tobacco. He reports that he drinks alcohol. He reports that he does not use drugs.    ROS Review of Systems  Constitutional: Negative for fever.  Respiratory: Negative for shortness of breath.   Cardiovascular: Negative for chest pain.  Musculoskeletal: Negative for arthralgias.  Skin: Negative for rash.    Objective:  BP 128/76   Pulse 88   Temp (!) 97 F (36.1 C)   Ht 5\' 9"  (1.753 m)   Wt 239 lb (108.4 kg)   BMI 35.29 kg/m   BP Readings from Last 3 Encounters:  05/22/18 128/76  05/04/18 112/78    03/29/18 129/89    Wt Readings from Last 3 Encounters:  05/22/18 239 lb (108.4 kg)  05/04/18 241 lb (109.3 kg)  03/29/18 238 lb (108 kg)     Physical Exam  Constitutional: He appears well-developed and well-nourished.  HENT:  Head: Normocephalic and atraumatic.  Right Ear: Tympanic membrane and external ear normal. No decreased hearing is noted.  Left Ear: Tympanic membrane and external ear normal. No decreased hearing is noted.  Mouth/Throat: No oropharyngeal exudate or posterior oropharyngeal erythema.  Eyes: Pupils are equal, round, and reactive to light.  Neck: Normal range of motion. Neck supple.  Cardiovascular: Normal rate and regular rhythm.  No murmur heard. Pulmonary/Chest: Breath sounds normal. No respiratory distress.  Abdominal: Soft. Bowel sounds are normal. He exhibits no mass. There is no tenderness.  Vitals reviewed.     Assessment & Plan:   Cesar Leon was seen today for medical management of chronic issues.  Diagnoses and all orders for this visit:  New onset type 2 diabetes mellitus (HCC)  Opioid type dependence, continuous (HCC)  Other orders -     Discontinue: metFORMIN (GLUCOPHAGE-XR) 500 MG 24 hr tablet; Take 1 tablet (500 mg total) by mouth daily with breakfast. -     buprenorphine (SUBUTEX) 2 MG SUBL SL tablet; Place 1 tablet (2 mg total) under the tongue daily. For 2 weeks, then q o day for two weeks, then DC -     atorvastatin (LIPITOR) 40 MG tablet; TAKE ONE (1)  TABLET EACH DAY -     fluticasone (FLONASE) 50 MCG/ACT nasal spray; USE 2 SPRAYS IN EACH NOSTRIL AT BEDTIME -     metFORMIN (GLUCOPHAGE-XR) 500 MG 24 hr tablet; Take 1 tablet (500 mg total) by mouth daily with breakfast. -     omeprazole (PRILOSEC) 40 MG capsule; TAKE ONE (1) CAPSULE EACH DAY       I am having Cesar Leon start on buprenorphine. I am also having him maintain his Calcium Carb-Cholecalciferol (CALCIUM + D3 PO), Methylcellulose (Laxative), Multiple  Vitamins-Minerals (CENTRUM SILVER PO), methocarbamol, Oxycodone HCl, buprenorphine, GAVILAX, diclofenac, montelukast, cetirizine, atorvastatin, fluticasone, metFORMIN, and omeprazole. We will continue to administer testosterone cypionate.  Allergies as of 05/22/2018      Reactions   Codeine    Penicillins       Medication List        Accurate as of 05/22/18 10:41 PM. Always use your most recent med list.          atorvastatin 40 MG tablet Commonly known as:  LIPITOR TAKE ONE (1) TABLET EACH DAY   buprenorphine 2 MG Subl SL tablet Commonly known as:  SUBUTEX Place 1 tablet (2 mg total) under the tongue daily. For 2 weeks, then q o day for two weeks, then DC   buprenorphine 5 MCG/HR Ptwk patch Commonly known as:  BUTRANS - dosed mcg/hr APPLY 1 PATCH WEEKLY   CALCIUM + D3 PO Take by mouth.   CENTRUM SILVER PO Take 1 tablet by mouth daily.   cetirizine 10 MG tablet Commonly known as:  ZYRTEC Take 1 tablet (10 mg total) by mouth daily.   diclofenac 75 MG EC tablet Commonly known as:  VOLTAREN Take 1 tablet (75 mg total) by mouth 2 (two) times daily.   fluticasone 50 MCG/ACT nasal spray Commonly known as:  FLONASE USE 2 SPRAYS IN EACH NOSTRIL AT BEDTIME   GAVILAX powder Generic drug:  polyethylene glycol powder MIX 17 GRAMS INTO 8OZ OF WATER AND DRINKDAILY   metFORMIN 500 MG 24 hr tablet Commonly known as:  GLUCOPHAGE-XR Take 1 tablet (500 mg total) by mouth daily with breakfast.   methocarbamol 500 MG tablet Commonly known as:  ROBAXIN   Methylcellulose (Laxative) 500 MG Tabs Take 2 tablets by mouth daily.   montelukast 10 MG tablet Commonly known as:  SINGULAIR Take 1 tablet (10 mg total) by mouth at bedtime.   omeprazole 40 MG capsule Commonly known as:  PRILOSEC TAKE ONE (1) CAPSULE EACH DAY   Oxycodone HCl 10 MG Tabs Take 1 tablet (10 mg total) by mouth 4 (four) times daily as needed (for severe pain only).        Follow-up: Return in about 3  months (around 08/22/2018).  Mechele ClaudeWarren Deondra Labrador, M.D.

## 2018-05-22 NOTE — Patient Instructions (Signed)

## 2018-05-23 ENCOUNTER — Encounter: Payer: Self-pay | Admitting: Physical Therapy

## 2018-05-23 ENCOUNTER — Ambulatory Visit: Payer: Federal, State, Local not specified - PPO | Admitting: Physical Therapy

## 2018-05-23 DIAGNOSIS — M5442 Lumbago with sciatica, left side: Secondary | ICD-10-CM | POA: Diagnosis not present

## 2018-05-23 DIAGNOSIS — R262 Difficulty in walking, not elsewhere classified: Secondary | ICD-10-CM | POA: Diagnosis not present

## 2018-05-23 DIAGNOSIS — M6281 Muscle weakness (generalized): Secondary | ICD-10-CM | POA: Diagnosis not present

## 2018-05-23 DIAGNOSIS — G8929 Other chronic pain: Secondary | ICD-10-CM

## 2018-05-23 DIAGNOSIS — M545 Low back pain, unspecified: Secondary | ICD-10-CM

## 2018-05-23 NOTE — Therapy (Signed)
[facility] [place], [place], [address] Phone: [phone]   Fax:  [phone]  Physical Therapy Treatment  Progress Note Reporting Period 04/23/18 to 05/23/18  See note below for Objective Data and Assessment of Progress/Goals.      Patient Details  Name: Cesar Leon MRN: [id number] Date of Birth: 1960/07/07 Referring Provider (PT): Grier Rocher PA   Encounter Date: 05/23/2018  PT End of Session - 05/23/18 0934    Visit Number  10    Number of Visits  16    Date for PT Re-Evaluation  07/16/18    PT Start Time  0859    PT Stop Time  0957    PT Time Calculation (min)  58 min    Activity Tolerance  Patient tolerated treatment well    Behavior During Therapy  [facility] for tasks assessed/performed       Past Medical History:  Diagnosis Date  . Allergic rhinitis   . Chronic pain   . DDD (degenerative disc disease) 12/28/08  . Femur fracture, right ([place])   . GERD (gastroesophageal reflux disease)   . Hematuria   . History of GI bleed   . Hyperlipidemia   . Hypogonadism male   . Hypothyroidism   . Internal hemorrhoid   . PVD (peripheral vascular disease) ([place])     Past Surgical History:  Procedure Laterality Date  . CARPAL TUNNEL RELEASE  8/11  . right ankle   1976    There were no vitals filed for this visit.  Subjective Assessment - 05/23/18 0913    Subjective  Patient reported doing well after last treatment and has started weaning himself off pain meds per MD instruction    Pertinent History  DDD; right femoral fracture; CTS; right ankle surgery, s/p lumbar fusion on 01/15/18    Limitations  House hold activities;Other (comment);Lifting    How long can you walk comfortably?  I'm walking about 2 miles each day, pt reporting his left knee has been surgery,     Diagnostic tests  X-rays following surgery last one on 04/17/18.     Patient Stated Goals  Get back to work    Currently in Pain?  Yes    Pain Score  3     Pain Location  Back    Pain Orientation  Lower    Pain Descriptors / Indicators  Discomfort    Pain Type  Surgical pain    Pain Onset  More than a month ago    Pain Frequency  Constant    Aggravating Factors   prolong activity, certain movements    Pain Relieving Factors  at rest                       [facility] Adult PT Treatment/Exercise - 05/23/18 0001      Lumbar Exercises: Aerobic   Nustep  L6 x15 min UE/LE activity      Lumbar Exercises: Standing   Scapular Retraction  Strengthening;Both;20 reps;Limitations;15 reps;5 reps    Scapular Retraction Limitations  orange XTS    Shoulder Extension  Strengthening;Both;20 reps;15 reps;5 reps    Shoulder Extension Limitations  orange XTS    Other Standing Lumbar Exercises  standing 4# reachouts/  diagnols 2x10      Lumbar Exercises: Seated   Sit to Stand  20 reps;5 reps      Lumbar Exercises: Supine   Clam  20 reps;3 seconds;10 reps    Clam  Limitations  green t-band    Bent Knee Raise  20 reps    Bridge with [employer]  20 reps;10 reps    Straight Leg Raise  3 seconds;20 reps      Knee/Hip Exercises: Standing   Other Standing Knee Exercises  Power lift from waist to floor with red swiss ball x 10      Moist Heat Therapy   Number Minutes Moist Heat  15 Minutes    Moist Heat Location  Lumbar Spine      Electrical Stimulation   Electrical Stimulation Location  bilateral lumbar paraspinals    Electrical Stimulation Action  IFC    Electrical Stimulation Parameters  80-'150hz'$  x68mn    Electrical Stimulation Goals  Pain               PT Short Term Goals - 05/14/18 06712     PT SHORT TERM GOAL #1   Title  Pt will be independent with proper sitting and standing posture.     Baseline  pt currently wearing a lumbar corset to maintain proper back posture.     Time  3    Period  Weeks    Status  Achieved   Patient not wearing brace as much and able to maintain proper standing posture  05/14/18     PT SHORT TERM GOAL #2   Title  Pt will be independent in his initial HEP.     Time  4    Period  Weeks    Status  Achieved        PT Long Term Goals - 05/23/18 0944      PT LONG TERM GOAL #1   Title  Pt will be independent in HEP and progression    Time  8    Period  Weeks    Status  On-going      PT LONG TERM GOAL #2   Title  Pt able to demonstrate proper lifting body mechanics when lifting >/= 25# from floor.     Time  8    Period  Weeks    Status  On-going   Doing well with technique yet only using light weight swiss ball for technique 05/23/18     PT LONG TERM GOAL #3   Title  Pt will improve his FOTO score from 74% limiation to 54% limitation.     Time  8    Period  Weeks    Status  Achieved   10th visit 40% limitation 05/23/18     PT LONG TERM GOAL #4   Title  Pt will improve his 5 time sit to stand </= 15 seconds in order to improve functional mobility.     Baseline  37.12 seconds with UE support on 04/23/18    Time  8    Period  Weeks    Status  On-going      PT LONG TERM GOAL #5   Title  Pt to report no pain when amb for 20 minutes community surfaces.     Time  8    Period  Weeks    Status  Achieved   no pain with 270m 05/23/18           Plan - 05/23/18 0942    Clinical Impression Statement  Patient tolerated treatment well today. patient able to progress with exercises and core activation/posture activities with no discomfort. Patient able to perform power lift with good technique although continues to use  the light weight ball for technique only, will progress when able. No pain with 46mn of walking per patient. Met LTG #3 and #5 and other goals progressing.     Rehab Potential  Good    Clinical Impairments Affecting Rehab Potential  10th visit 40% limitation    PT Frequency  2x / week    PT Duration  8 weeks    PT Treatment/Interventions  ADLs/Self Care Home Management;Cryotherapy;Electrical Stimulation;Gait  training;Ultrasound;Moist Heat;Stair training;Functional mobility training;Therapeutic activities;Therapeutic exercise;Balance training;Neuromuscular re-education;Manual techniques;Patient/family education;Passive range of motion;Taping    PT Next Visit Plan  Cont to progress per tolerance and slow progression     Consulted and Agree with Plan of Care  Patient       Patient will benefit from skilled therapeutic intervention in order to improve the following deficits and impairments:  Abnormal gait, Decreased activity tolerance, Decreased mobility, Decreased range of motion, Decreased strength, Postural dysfunction, Pain, Improper body mechanics, Difficulty walking  Visit Diagnosis: Chronic bilateral low back pain without sciatica  Difficulty in walking, not elsewhere classified  Muscle weakness (generalized)  Chronic midline low back pain with left-sided sciatica     Problem List Patient Active Problem List   Diagnosis Date Noted  . Elevated fasting glucose 09/28/2015  . Pain medication agreement completed 09/28/2015  . Opioid type dependence, continuous ([place] 09/28/2015  . Gastroesophageal reflux disease without esophagitis 08/05/2015  . Spinal stenosis of lumbar region 04/29/2015  . Hypogonadism male 09/22/2014  . Hyperlipemia 09/22/2014    CLadean Raya PTA 05/23/18 10:35 AM  [facility] [address] [address]Phone: [phone]  Fax:  [phone] Name: MGOVIND FUREYMRN: [id number] of Birth: 804-19-1962

## 2018-05-26 ENCOUNTER — Telehealth: Payer: Self-pay | Admitting: Family Medicine

## 2018-05-26 NOTE — Telephone Encounter (Signed)
Probably coming from pain meds but will have to address this with PCP

## 2018-05-26 NOTE — Telephone Encounter (Signed)
Please address

## 2018-05-27 NOTE — Telephone Encounter (Signed)
Please contact the patient: DC metformin

## 2018-05-28 ENCOUNTER — Encounter: Payer: Self-pay | Admitting: Physical Therapy

## 2018-05-28 ENCOUNTER — Ambulatory Visit: Payer: Federal, State, Local not specified - PPO | Attending: Physician Assistant | Admitting: Physical Therapy

## 2018-05-28 DIAGNOSIS — M5442 Lumbago with sciatica, left side: Secondary | ICD-10-CM | POA: Insufficient documentation

## 2018-05-28 DIAGNOSIS — M6281 Muscle weakness (generalized): Secondary | ICD-10-CM | POA: Diagnosis not present

## 2018-05-28 DIAGNOSIS — M545 Low back pain: Secondary | ICD-10-CM | POA: Diagnosis not present

## 2018-05-28 DIAGNOSIS — R262 Difficulty in walking, not elsewhere classified: Secondary | ICD-10-CM | POA: Diagnosis not present

## 2018-05-28 DIAGNOSIS — G8929 Other chronic pain: Secondary | ICD-10-CM | POA: Diagnosis not present

## 2018-05-28 NOTE — Therapy (Signed)
[facility] [place], [place], [address] Phone: [phone]   Fax:  [phone]  Physical Therapy Treatment  Patient Details  Name: Cesar Leon MRN: [id number] Date of Birth: 06-Oct-1960 Referring Provider (PT): Grier Rocher PA   Encounter Date: 05/23/2018    Past Medical History:  Diagnosis Date  . Allergic rhinitis   . Chronic pain   . DDD (degenerative disc disease) 12/28/08  . Femur fracture, right ([place])   . GERD (gastroesophageal reflux disease)   . Hematuria   . History of GI bleed   . Hyperlipidemia   . Hypogonadism male   . Hypothyroidism   . Internal hemorrhoid   . PVD (peripheral vascular disease) ([place])     Past Surgical History:  Procedure Laterality Date  . CARPAL TUNNEL RELEASE  8/11  . right ankle   1976    There were no vitals filed for this visit.                              PT Short Term Goals - 05/14/18 5038      PT SHORT TERM GOAL #1   Title  Pt will be independent with proper sitting and standing posture.     Baseline  pt currently wearing a lumbar corset to maintain proper back posture.     Time  3    Period  Weeks    Status  Achieved   Patient not wearing brace as much and able to maintain proper standing posture 05/14/18     PT SHORT TERM GOAL #2   Title  Pt will be independent in his initial HEP.     Time  4    Period  Weeks    Status  Achieved        PT Long Term Goals - 05/28/18 8828      PT LONG TERM GOAL #1   Title  Pt will be independent in HEP and progression    Period  Weeks    Status  On-going      PT LONG TERM GOAL #2   Title  Pt able to demonstrate proper lifting body mechanics when lifting >/= 25# from floor.     Time  8    Period  Weeks    Status  On-going   Today started with box lift using 14# 05/28/18     PT LONG TERM GOAL #3   Title  Pt will improve his FOTO score from 74% limiation to 54% limitation.     Baseline  Met 05/23/18    Time  8    Period  Weeks    Status  Achieved      PT LONG TERM GOAL #4   Title  Pt will improve his 5 time sit to stand </= 15 seconds in order to improve functional mobility.     Baseline  37.12 seconds with UE support on 04/23/18    Period  Weeks    Status  On-going      PT LONG TERM GOAL #5   Title  Pt to report no pain when amb for 20 minutes community surfaces.     Period  Weeks    Status  Achieved              Patient will benefit from skilled therapeutic intervention in order to improve the following deficits and impairments:  Abnormal gait, Decreased activity tolerance, Decreased  mobility, Decreased range of motion, Decreased strength, Postural dysfunction, Pain, Improper body mechanics, Difficulty walking  Visit Diagnosis: Chronic bilateral low back pain without sciatica  Difficulty in walking, not elsewhere classified  Muscle weakness (generalized)  Chronic midline low back pain with left-sided sciatica     Problem List Patient Active Problem List   Diagnosis Date Noted  . Elevated fasting glucose 09/28/2015  . Pain medication agreement completed 09/28/2015  . Opioid type dependence, continuous ([place]) 09/28/2015  . Gastroesophageal reflux disease without esophagitis 08/05/2015  . Spinal stenosis of lumbar region 04/29/2015  . Hypogonadism male 09/22/2014  . Hyperlipemia 09/22/2014    Jarrick Fjeld P, PTA 05/28/2018, 10:49 AM  [facility] [place], [place], [address] Phone: [phone]   Fax:  [phone]  Name: Cesar Leon MRN: [id number] Date of Birth: Jun 29, 1960

## 2018-05-28 NOTE — Therapy (Signed)
[facility] [place], [place], [address] Phone: [phone]   Fax:  [phone]  Physical Therapy Treatment  Patient Details  Name: Cesar Leon MRN: [id number] Date of Birth: 01-21-1961 Referring Provider (PT): Grier Rocher PA   Encounter Date: 05/28/2018  PT End of Session - 05/28/18 0925    Visit Number  11    Number of Visits  16    Date for PT Re-Evaluation  07/16/18    Authorization Type  [employer]    PT Start Time  0901    PT Stop Time  0959    PT Time Calculation (min)  58 min    Activity Tolerance  Patient tolerated treatment well    Behavior During Therapy  [facility] for tasks assessed/performed       Past Medical History:  Diagnosis Date  . Allergic rhinitis   . Chronic pain   . DDD (degenerative disc disease) 12/28/08  . Femur fracture, right ([place])   . GERD (gastroesophageal reflux disease)   . Hematuria   . History of GI bleed   . Hyperlipidemia   . Hypogonadism male   . Hypothyroidism   . Internal hemorrhoid   . PVD (peripheral vascular disease) ([place])     Past Surgical History:  Procedure Laterality Date  . CARPAL TUNNEL RELEASE  8/11  . right ankle   1976    There were no vitals filed for this visit.  Subjective Assessment - 05/28/18 0914    Subjective  Patient reported doing well today with some ongoing discomfort    Pertinent History  DDD; right femoral fracture; CTS; right ankle surgery, s/p lumbar fusion on 01/15/18    Limitations  House hold activities;Other (comment);Lifting    How long can you walk comfortably?  I'm walking about 2 miles each day, pt reporting his left knee has been surgery,     Diagnostic tests  X-rays following surgery last one on 04/17/18.     Patient Stated Goals  Get back to work    Currently in Pain?  Yes    Pain Score  3     Pain Location  Back    Pain Orientation  Lower    Pain Descriptors / Indicators  Discomfort    Pain Type  Surgical pain    Pain Onset   More than a month ago    Pain Frequency  Constant    Aggravating Factors   prolong activity or certain movement    Pain Relieving Factors  rest                       [facility] Adult PT Treatment/Exercise - 05/28/18 0001      Lumbar Exercises: Aerobic   Nustep  L6 x15 min UE/LE activity      Lumbar Exercises: Standing   Scapular Retraction  Strengthening;Both;20 reps;Limitations;15 reps;5 reps    Scapular Retraction Limitations  orange XTS    Shoulder Extension  Strengthening;Both;20 reps;15 reps;5 reps    Shoulder Extension Limitations  orange XTS    Other Standing Lumbar Exercises  standing 4# reachouts/  diagnols 2x10      Lumbar Exercises: Seated   Sit to Stand  20 reps;5 reps      Lumbar Exercises: Supine   Clam  20 reps;3 seconds;10 reps    Clam Limitations  green t-band    Bent Knee Raise  20 reps    Bridge with [employer]  20 reps;10 reps  Straight Leg Raise  3 seconds;20 reps      Knee/Hip Exercises: Standing   Other Standing Knee Exercises  Power lift from waist to floor with 14# boxl x 10      Moist Heat Therapy   Number Minutes Moist Heat  15 Minutes    Moist Heat Location  Lumbar Spine      Electrical Stimulation   Electrical Stimulation Location  bilateral lumbar paraspinals    Electrical Stimulation Action  IFC    Electrical Stimulation Parameters  80-'150hz'$  x5mn    Electrical Stimulation Goals  Pain               PT Short Term Goals - 05/14/18 01517     PT SHORT TERM GOAL #1   Title  Pt will be independent with proper sitting and standing posture.     Baseline  pt currently wearing a lumbar corset to maintain proper back posture.     Time  3    Period  Weeks    Status  Achieved   Patient not wearing brace as much and able to maintain proper standing posture 05/14/18     PT SHORT TERM GOAL #2   Title  Pt will be independent in his initial HEP.     Time  4    Period  Weeks    Status  Achieved        PT Long Term  Goals - 05/28/18 06160     PT LONG TERM GOAL #1   Title  Pt will be independent in HEP and progression    Period  Weeks    Status  On-going      PT LONG TERM GOAL #2   Title  Pt able to demonstrate proper lifting body mechanics when lifting >/= 25# from floor.     Time  8    Period  Weeks    Status  On-going   Today started with box lift using 14# 05/28/18     PT LONG TERM GOAL #3   Title  Pt will improve his FOTO score from 74% limiation to 54% limitation.     Baseline  Met 05/23/18    Time  8    Period  Weeks    Status  Achieved      PT LONG TERM GOAL #4   Title  Pt will improve his 5 time sit to stand </= 15 seconds in order to improve functional mobility.     Baseline  37.12 seconds with UE support on 04/23/18    Period  Weeks    Status  On-going      PT LONG TERM GOAL #5   Title  Pt to report no pain when amb for 20 minutes community surfaces.     Period  Weeks    Status  Achieved            Plan - 05/28/18 0936    Clinical Impression Statement  Patient tolerated treatment well today. Patient able to progress with power lift /box lift today with good technique after educational cues yet difficulty with getting all the way down to floor. Patient has ongoing minimal tightness in low back with certain movements. Goals progressing.    Rehab Potential  Good    Clinical Impairments Affecting Rehab Potential  10th visit 40% limitation    PT Frequency  2x / week    PT Duration  8 weeks    PT Treatment/Interventions  ADLs/Self  Care Home Management;Cryotherapy;Electrical Stimulation;Gait training;Ultrasound;Moist Heat;Stair training;Functional mobility training;Therapeutic activities;Therapeutic exercise;Balance training;Neuromuscular re-education;Manual techniques;Patient/family education;Passive range of motion;Taping    PT Next Visit Plan  Cont to progress per tolerance and slow progression     Consulted and Agree with Plan of Care  Patient       Patient will benefit  from skilled therapeutic intervention in order to improve the following deficits and impairments:  Abnormal gait, Decreased activity tolerance, Decreased mobility, Decreased range of motion, Decreased strength, Postural dysfunction, Pain, Improper body mechanics, Difficulty walking  Visit Diagnosis: Chronic bilateral low back pain without sciatica  Difficulty in walking, not elsewhere classified  Muscle weakness (generalized)  Chronic midline low back pain with left-sided sciatica     Problem List Patient Active Problem List   Diagnosis Date Noted  . Elevated fasting glucose 09/28/2015  . Pain medication agreement completed 09/28/2015  . Opioid type dependence, continuous ([place]) 09/28/2015  . Gastroesophageal reflux disease without esophagitis 08/05/2015  . Spinal stenosis of lumbar region 04/29/2015  . Hypogonadism male 09/22/2014  . Hyperlipemia 09/22/2014    Iran Kievit P, PTA 05/28/2018, 10:29 AM  [facility] [place], [place], [address] Phone: [phone]   Fax:  [phone]  Name: JAVANI SPRATT MRN: [id number] Date of Birth: 02-Jun-1961

## 2018-05-28 NOTE — Telephone Encounter (Signed)
Pt aware to stop Metformin and see if the symptoms get better.

## 2018-05-29 ENCOUNTER — Telehealth: Payer: Self-pay | Admitting: Family Medicine

## 2018-05-29 NOTE — Telephone Encounter (Signed)
Dizziness, nausea, vomiting, and diarrhea after starting pain pill and metformin. Hasn't taken metformin in several days. His symptoms resolved when he stopped taking taking the pain pill and switched back to the patch. He tried the pill again this morning and symptoms returned. Has not restarted metformin since stopping it.    He is weaning off of [place] patch. Question if this is a withdrawal symptom and not a side effect of the sublingual tabs.

## 2018-05-29 NOTE — Telephone Encounter (Signed)
This is most likely due to the change in pain med. Resume the metformin. Go back to the patch for now.

## 2018-05-30 ENCOUNTER — Other Ambulatory Visit: Payer: Self-pay | Admitting: Family Medicine

## 2018-05-30 MED ORDER — BUPRENORPHINE 5 MCG/HR TD PTWK: MEDICATED_PATCH | TRANSDERMAL | 0 refills | Status: DC

## 2018-05-30 NOTE — Telephone Encounter (Signed)
Pt's wife aware of recommendations and voiced understanding. Pt does need new rx for Butrans patch because he only had one left.

## 2018-05-30 NOTE — Telephone Encounter (Signed)
I sent in 4 patches. Will need to see him when those run out to discuss next options for tapering off opiates. Thanks

## 2018-05-30 NOTE — Telephone Encounter (Signed)
Pt's wife aware and will call back tomorrow to schedule follow up.

## 2018-05-31 ENCOUNTER — Ambulatory Visit: Payer: Federal, State, Local not specified - PPO

## 2018-05-31 ENCOUNTER — Encounter: Payer: Federal, State, Local not specified - PPO | Admitting: Physical Therapy

## 2018-06-04 ENCOUNTER — Encounter: Payer: Self-pay | Admitting: Physical Therapy

## 2018-06-04 ENCOUNTER — Ambulatory Visit (INDEPENDENT_AMBULATORY_CARE_PROVIDER_SITE_OTHER): Payer: Federal, State, Local not specified - PPO | Admitting: *Deleted

## 2018-06-04 ENCOUNTER — Ambulatory Visit: Payer: Federal, State, Local not specified - PPO | Admitting: Physical Therapy

## 2018-06-04 DIAGNOSIS — M5442 Lumbago with sciatica, left side: Secondary | ICD-10-CM

## 2018-06-04 DIAGNOSIS — E349 Endocrine disorder, unspecified: Secondary | ICD-10-CM | POA: Diagnosis not present

## 2018-06-04 DIAGNOSIS — M6281 Muscle weakness (generalized): Secondary | ICD-10-CM

## 2018-06-04 DIAGNOSIS — R262 Difficulty in walking, not elsewhere classified: Secondary | ICD-10-CM

## 2018-06-04 DIAGNOSIS — G8929 Other chronic pain: Secondary | ICD-10-CM | POA: Diagnosis not present

## 2018-06-04 DIAGNOSIS — M545 Low back pain: Principal | ICD-10-CM

## 2018-06-04 NOTE — Therapy (Signed)
[facility] [place], [place], [address] Phone: [phone]   Fax:  [phone]  Physical Therapy Treatment  Patient Details  Name: Cesar Leon MRN: [id number] Date of Birth: 05-12-1961 Referring Provider (PT): Grier Rocher PA   Encounter Date: 06/04/2018  PT End of Session - 06/04/18 0939    Visit Number  12    Number of Visits  16    Date for PT Re-Evaluation  07/16/18    Authorization Type  BCBS    PT Start Time  0901    PT Stop Time  0957    PT Time Calculation (min)  56 min    Activity Tolerance  Patient tolerated treatment well    Behavior During Therapy  [facility] for tasks assessed/performed       Past Medical History:  Diagnosis Date  . Allergic rhinitis   . Chronic pain   . DDD (degenerative disc disease) 12/28/08  . Femur fracture, right ([place])   . GERD (gastroesophageal reflux disease)   . Hematuria   . History of GI bleed   . Hyperlipidemia   . Hypogonadism male   . Hypothyroidism   . Internal hemorrhoid   . PVD (peripheral vascular disease) ([place])     Past Surgical History:  Procedure Laterality Date  . CARPAL TUNNEL RELEASE  8/11  . right ankle   1976    There were no vitals filed for this visit.  Subjective Assessment - 06/04/18 0905    Subjective  Patient sick last week from pain meds being decreased, better today    Pertinent History  DDD; right femoral fracture; CTS; right ankle surgery, s/p lumbar fusion on 01/15/18    Limitations  House hold activities;Other (comment);Lifting    How long can you walk comfortably?  I'm walking about 2 miles each day, pt reporting his left knee has been surgery,     Diagnostic tests  X-rays following surgery last one on 04/17/18.     Patient Stated Goals  Get back to work    Currently in Pain?  Yes    Pain Score  2     Pain Location  Back    Pain Orientation  Lower    Pain Descriptors / Indicators  Discomfort    Pain Type  Surgical pain    Pain  Onset  More than a month ago    Pain Frequency  Constant    Aggravating Factors   prolong activity or certain movements    Pain Relieving Factors  at rest/meds                       [facility] Adult PT Treatment/Exercise - 06/04/18 0001      Exercises   Exercises  Lumbar;Knee/Hip      Lumbar Exercises: Aerobic   Nustep  L6 x15 min UE/LE activity      Lumbar Exercises: Standing   Scapular Retraction  Strengthening;Both;20 reps;Limitations;15 reps;5 reps    Scapular Retraction Limitations  orange XTS    Shoulder Extension  Strengthening;Both;20 reps;15 reps;5 reps    Shoulder Extension Limitations  orange XTS    Other Standing Lumbar Exercises  standing 4# reachouts/  diagnols 2x10      Lumbar Exercises: Seated   Sit to Stand  20 reps;5 reps   with 4# ball     Lumbar Exercises: Supine   Bridge with clamshell  20 reps;3 seconds    Bridge with [employer] Limitations  green t-band    Straight Leg Raise  3 seconds   2x10 each side     Knee/Hip Exercises: Standing   Other Standing Knee Exercises  Power lift from waist to floor with 14# box x 10      Moist Heat Therapy   Number Minutes Moist Heat  15 Minutes    Moist Heat Location  Lumbar Spine      Electrical Stimulation   Electrical Stimulation Location  bilateral lumbar paraspinals    Electrical Stimulation Action  IFC    Electrical Stimulation Parameters  80-'150hz'$  x58mn    Electrical Stimulation Goals  Pain               PT Short Term Goals - 05/14/18 01308     PT SHORT TERM GOAL #1   Title  Pt will be independent with proper sitting and standing posture.     Baseline  pt currently wearing a lumbar corset to maintain proper back posture.     Time  3    Period  Weeks    Status  Achieved   Patient not wearing brace as much and able to maintain proper standing posture 05/14/18     PT SHORT TERM GOAL #2   Title  Pt will be independent in his initial HEP.     Time  4    Period  Weeks    Status   Achieved        PT Long Term Goals - 06/04/18 0934      PT LONG TERM GOAL #1   Title  Pt will be independent in HEP and progression    Time  8    Period  Weeks    Status  On-going      PT LONG TERM GOAL #2   Title  Pt able to demonstrate proper lifting body mechanics when lifting >/= 25# from floor.     Time  8    Period  Weeks    Status  On-going   currently at 14# 06/04/18     PT LONG TERM GOAL #3   Title  Pt will improve his FOTO score from 74% limiation to 54% limitation.     Baseline  Met 05/23/18    Time  8    Period  Weeks    Status  Achieved      PT LONG TERM GOAL #4   Title  Pt will improve his 5 time sit to stand </= 15 seconds in order to improve functional mobility.     Baseline  37.12 seconds with UE support on 04/23/18    Time  8    Period  Weeks    Status  On-going   x4 in 15 seconds 06/04/18     PT LONG TERM GOAL #5   Title  Pt to report no pain when amb for 20 minutes community surfaces.     Time  8    Period  Weeks    Status  Achieved            Plan - 06/04/18 0939    Clinical Impression Statement  Patient tolerated treatment well today and able to progress exercises with good technique and no increased discomfort. Patient able to perform sit to stand yet only 4 in 15 seconds. Patient goals ongoing.    Rehab Potential  Good    Clinical Impairments Affecting Rehab Potential  10th visit 40% limitation    PT Frequency  2x /  week    PT Duration  8 weeks    PT Treatment/Interventions  ADLs/Self Care Home Management;Cryotherapy;Electrical Stimulation;Gait training;Ultrasound;Moist Heat;Stair training;Functional mobility training;Therapeutic activities;Therapeutic exercise;Balance training;Neuromuscular re-education;Manual techniques;Patient/family education;Passive range of motion;Taping    PT Next Visit Plan  Cont to progress per tolerance and slow progression     Consulted and Agree with Plan of Care  Patient       Patient will benefit from  skilled therapeutic intervention in order to improve the following deficits and impairments:  Abnormal gait, Decreased activity tolerance, Decreased mobility, Decreased range of motion, Decreased strength, Postural dysfunction, Pain, Improper body mechanics, Difficulty walking  Visit Diagnosis: Chronic bilateral low back pain without sciatica  Difficulty in walking, not elsewhere classified  Muscle weakness (generalized)  Chronic midline low back pain with left-sided sciatica     Problem List Patient Active Problem List   Diagnosis Date Noted  . Elevated fasting glucose 09/28/2015  . Pain medication agreement completed 09/28/2015  . Opioid type dependence, continuous ([place]) 09/28/2015  . Gastroesophageal reflux disease without esophagitis 08/05/2015  . Spinal stenosis of lumbar region 04/29/2015  . Hypogonadism male 09/22/2014  . Hyperlipemia 09/22/2014    DUNFORD, CHRISTINA P, PTA 06/04/2018, 10:04 AM  [facility] [place], [place], [address] Phone: [phone]   Fax:  [phone]  Name: TRAVANTE KNEE MRN: [id number] Date of Birth: 18-Jan-1961

## 2018-06-04 NOTE — Progress Notes (Signed)
Pt given testosterone inj Tolerated well 

## 2018-06-07 ENCOUNTER — Ambulatory Visit: Payer: Federal, State, Local not specified - PPO | Admitting: *Deleted

## 2018-06-07 DIAGNOSIS — R262 Difficulty in walking, not elsewhere classified: Secondary | ICD-10-CM | POA: Diagnosis not present

## 2018-06-07 DIAGNOSIS — M6281 Muscle weakness (generalized): Secondary | ICD-10-CM | POA: Diagnosis not present

## 2018-06-07 DIAGNOSIS — M5442 Lumbago with sciatica, left side: Secondary | ICD-10-CM

## 2018-06-07 DIAGNOSIS — M545 Low back pain: Secondary | ICD-10-CM | POA: Diagnosis not present

## 2018-06-07 DIAGNOSIS — G8929 Other chronic pain: Secondary | ICD-10-CM

## 2018-06-07 NOTE — Therapy (Signed)
[facility] [place], [place], [address] Phone: [phone]   Fax:  [phone]  Physical Therapy Treatment  Patient Details  Name: Cesar Leon MRN: [id number] Date of Birth: 03-Feb-1961 Referring Provider (PT): Grier Rocher PA   Encounter Date: 06/07/2018  PT End of Session - 06/07/18 0915    Visit Number  13    Number of Visits  16    Date for PT Re-Evaluation  07/16/18    Authorization Type  BCBS    PT Start Time  0900    PT Stop Time  0951    PT Time Calculation (min)  51 min       Past Medical History:  Diagnosis Date  . Allergic rhinitis   . Chronic pain   . DDD (degenerative disc disease) 12/28/08  . Femur fracture, right ([place])   . GERD (gastroesophageal reflux disease)   . Hematuria   . History of GI bleed   . Hyperlipidemia   . Hypogonadism male   . Hypothyroidism   . Internal hemorrhoid   . PVD (peripheral vascular disease) ([place])     Past Surgical History:  Procedure Laterality Date  . CARPAL TUNNEL RELEASE  8/11  . right ankle   1976    There were no vitals filed for this visit.  Subjective Assessment - 06/07/18 0914    Subjective  LB tightness    Patient is accompained by:  Family member    Pertinent History  DDD; right femoral fracture; CTS; right ankle surgery, s/p lumbar fusion on 01/15/18    Limitations  House hold activities;Other (comment);Lifting    How long can you walk comfortably?  I'm walking about 2 miles each day, pt reporting his left knee has been surgery,     Diagnostic tests  X-rays following surgery last one on 04/17/18.     Patient Stated Goals  Get back to work    Currently in Pain?  Yes    Pain Score  2     Pain Location  Back    Pain Orientation  Lower    Pain Descriptors / Indicators  Tightness    Pain Type  Surgical pain    Pain Onset  More than a month ago    Pain Frequency  Constant                       [facility] Adult PT  Treatment/Exercise - 06/07/18 0001      Exercises   Exercises  Lumbar;Knee/Hip      Lumbar Exercises: Aerobic   Nustep  L6 x15 min UE/LE activity      Lumbar Exercises: Standing   Scapular Retraction  Strengthening;Both;20 reps;Limitations;15 reps;5 reps    Scapular Retraction Limitations  orange XTS    Shoulder Extension  Strengthening;Both;20 reps;10 reps   3x10   Shoulder Extension Limitations  orange XTS    Other Standing Lumbar Exercises  standing 4# reachouts/  diagnols 3x10      Lumbar Exercises: Seated   Sit to Stand  --   with 4# ball     Lumbar Exercises: Supine   Straight Leg Raise  --   2x10 each side     Knee/Hip Exercises: Standing   Other Standing Knee Exercises  Power lift from waist to floor with 14# box x 10      Moist Heat Therapy   Number Minutes Moist Heat  15 Minutes    Moist Heat Location  Lumbar Spine      Electrical Stimulation   Electrical Stimulation Location  bilateral lumbar paraspinals IFC 80-'150hz'$  x 15 mins    Electrical Stimulation Goals  Pain               PT Short Term Goals - 05/14/18 0928      PT SHORT TERM GOAL #1   Title  Pt will be independent with proper sitting and standing posture.     Baseline  pt currently wearing a lumbar corset to maintain proper back posture.     Time  3    Period  Weeks    Status  Achieved   Patient not wearing brace as much and able to maintain proper standing posture 05/14/18     PT SHORT TERM GOAL #2   Title  Pt will be independent in his initial HEP.     Time  4    Period  Weeks    Status  Achieved        PT Long Term Goals - 06/04/18 0934      PT LONG TERM GOAL #1   Title  Pt will be independent in HEP and progression    Time  8    Period  Weeks    Status  On-going      PT LONG TERM GOAL #2   Title  Pt able to demonstrate proper lifting body mechanics when lifting >/= 25# from floor.     Time  8    Period  Weeks    Status  On-going   currently at 14# 06/04/18     PT LONG  TERM GOAL #3   Title  Pt will improve his FOTO score from 74% limiation to 54% limitation.     Baseline  Met 05/23/18    Time  8    Period  Weeks    Status  Achieved      PT LONG TERM GOAL #4   Title  Pt will improve his 5 time sit to stand </= 15 seconds in order to improve functional mobility.     Baseline  37.12 seconds with UE support on 04/23/18    Time  8    Period  Weeks    Status  On-going   x4 in 15 seconds 06/04/18     PT LONG TERM GOAL #5   Title  Pt to report no pain when amb for 20 minutes community surfaces.     Time  8    Period  Weeks    Status  Achieved            Plan - 06/07/18 0942    Clinical Impression Statement  Pt arrived today doing fairly well with mainly complaints of tightness in LB. He was able to complete all core exs and functional act.'s with no increase in pain.. Normal modality response. LTGs are  ongoing.    Clinical Impairments Affecting Rehab Potential  10th visit 40% limitation    PT Frequency  2x / week    PT Duration  8 weeks    PT Treatment/Interventions  ADLs/Self Care Home Management;Cryotherapy;Electrical Stimulation;Gait training;Ultrasound;Moist Heat;Stair training;Functional mobility training;Therapeutic activities;Therapeutic exercise;Balance training;Neuromuscular re-education;Manual techniques;Patient/family education;Passive range of motion;Taping    PT Next Visit Plan  Cont to progress per tolerance and slow progression     PT Home Exercise Plan  sit to stand, walking progression    Consulted and Agree with Plan of Care  Patient  Patient will benefit from skilled therapeutic intervention in order to improve the following deficits and impairments:  Abnormal gait, Decreased activity tolerance, Decreased mobility, Decreased range of motion, Decreased strength, Postural dysfunction, Pain, Improper body mechanics, Difficulty walking  Visit Diagnosis: Chronic bilateral low back pain without sciatica  Difficulty in walking,  not elsewhere classified  Muscle weakness (generalized)  Chronic midline low back pain with left-sided sciatica     Problem List Patient Active Problem List   Diagnosis Date Noted  . Elevated fasting glucose 09/28/2015  . Pain medication agreement completed 09/28/2015  . Opioid type dependence, continuous ([place]) 09/28/2015  . Gastroesophageal reflux disease without esophagitis 08/05/2015  . Spinal stenosis of lumbar region 04/29/2015  . Hypogonadism male 09/22/2014  . Hyperlipemia 09/22/2014    RAMSEUR,CHRIS, PTA 06/07/2018, 10:24 AM  [facility] [place], [place], [address] Phone: [phone]   Fax:  [phone]  Name: Cesar Leon MRN: [id number] Date of Birth: [birth year]

## 2018-06-11 ENCOUNTER — Ambulatory Visit: Payer: Federal, State, Local not specified - PPO | Admitting: Physical Therapy

## 2018-06-11 ENCOUNTER — Encounter: Payer: Self-pay | Admitting: Physical Therapy

## 2018-06-11 DIAGNOSIS — M5442 Lumbago with sciatica, left side: Secondary | ICD-10-CM | POA: Diagnosis not present

## 2018-06-11 DIAGNOSIS — M6281 Muscle weakness (generalized): Secondary | ICD-10-CM

## 2018-06-11 DIAGNOSIS — G8929 Other chronic pain: Secondary | ICD-10-CM

## 2018-06-11 DIAGNOSIS — R262 Difficulty in walking, not elsewhere classified: Secondary | ICD-10-CM | POA: Diagnosis not present

## 2018-06-11 DIAGNOSIS — M545 Low back pain: Secondary | ICD-10-CM | POA: Diagnosis not present

## 2018-06-11 NOTE — Therapy (Signed)
[facility] [place], [place], [address] Phone: [phone]   Fax:  [phone]  Physical Therapy Treatment  Patient Details  Name: Cesar Leon MRN: [id number] Date of Birth: 11/15/1960 Referring Provider (PT): Grier Rocher PA   Encounter Date: 06/11/2018  PT End of Session - 06/11/18 0930    Visit Number  14    Number of Visits  16    Date for PT Re-Evaluation  07/16/18    Authorization Type  BCBS    PT Start Time  0901    PT Stop Time  0952    PT Time Calculation (min)  51 min    Activity Tolerance  Patient tolerated treatment well    Behavior During Therapy  [facility] for tasks assessed/performed       Past Medical History:  Diagnosis Date  . Allergic rhinitis   . Chronic pain   . DDD (degenerative disc disease) 12/28/08  . Femur fracture, right ([place])   . GERD (gastroesophageal reflux disease)   . Hematuria   . History of GI bleed   . Hyperlipidemia   . Hypogonadism male   . Hypothyroidism   . Internal hemorrhoid   . PVD (peripheral vascular disease) ([place])     Past Surgical History:  Procedure Laterality Date  . CARPAL TUNNEL RELEASE  8/11  . right ankle   1976    There were no vitals filed for this visit.  Subjective Assessment - 06/11/18 0908    Subjective  Patient tolerated last treatment well with ongoing tightness    Pertinent History  DDD; right femoral fracture; CTS; right ankle surgery, s/p lumbar fusion on 01/15/18    Limitations  House hold activities;Other (comment);Lifting    How long can you walk comfortably?  I'm walking about 2 miles each day, pt reporting his left knee has been surgery,     Diagnostic tests  X-rays following surgery last one on 04/17/18.     Patient Stated Goals  Get back to work    Currently in Pain?  Yes    Pain Score  1     Pain Location  Back    Pain Orientation  Lower    Pain Descriptors / Indicators  Tightness    Pain Type  Surgical pain    Pain Onset   More than a month ago    Pain Frequency  Constant    Aggravating Factors   bending    Pain Relieving Factors  at rest                       [facility] Adult PT Treatment/Exercise - 06/11/18 0001      Lumbar Exercises: Aerobic   Elliptical  L1 R1 x7mn    Nustep  L6 x15 min LE activity      Lumbar Exercises: Seated   Sit to Stand  20 reps   6# ball     Lumbar Exercises: Supine   Bent Knee Raise Limitations  marching with 4# ball 2x10    Bridge Limitations  straight leg on swiss ball 2x10    Other Supine Lumbar Exercises  alt LE lowering 2x10      Knee/Hip Exercises: Standing   Other Standing Knee Exercises  Power lift from waist to floor with 20# box x 10      Moist Heat Therapy   Number Minutes Moist Heat  15 Minutes    Moist Heat Location  Lumbar Spine  Acupuncturist Location  bilateral lumbar paraspinals IFC 80-'150hz'$  x 15 mins    Electrical Stimulation Goals  Pain               PT Short Term Goals - 05/14/18 0928      PT SHORT TERM GOAL #1   Title  Pt will be independent with proper sitting and standing posture.     Baseline  pt currently wearing a lumbar corset to maintain proper back posture.     Time  3    Period  Weeks    Status  Achieved   Patient not wearing brace as much and able to maintain proper standing posture 05/14/18     PT SHORT TERM GOAL #2   Title  Pt will be independent in his initial HEP.     Time  4    Period  Weeks    Status  Achieved        PT Long Term Goals - 06/11/18 0932      PT LONG TERM GOAL #1   Title  Pt will be independent in HEP and progression    Time  8    Period  Weeks    Status  Achieved      PT LONG TERM GOAL #2   Title  Pt able to demonstrate proper lifting body mechanics when lifting >/= 25# from floor.     Time  8    Period  Weeks    Status  On-going   20# today with good technique 06/11/18     PT LONG TERM GOAL #3   Title  Pt will improve his FOTO  score from 74% limiation to 54% limitation.     Baseline  Met 05/23/18    Time  8    Period  Weeks    Status  Achieved      PT LONG TERM GOAL #4   Title  Pt will improve his 5 time sit to stand </= 15 seconds in order to improve functional mobility.     Baseline  37.12 seconds with UE support on 04/23/18    Time  8    Period  Weeks    Status  Achieved   Met 06/11/18     PT LONG TERM GOAL #5   Title  Pt to report no pain when amb for 20 minutes community surfaces.     Time  8    Period  Weeks    Status  Achieved            Plan - 06/11/18 0933    Clinical Impression Statement  Patient tolerated treatment well today. Today progressed patient for core activation exercises. Patient able to perform all activities with tightness in low back only, minimal discomfort overall. Patient is independent with HEP and anle to perform sit to stand in a [employer]. Met LTG #1 and #4 today. Remaining goal ongoing.     Rehab Potential  Good    Clinical Impairments Affecting Rehab Potential  10th visit 40% limitation    PT Frequency  2x / week    PT Duration  8 weeks    PT Treatment/Interventions  ADLs/Self Care Home Management;Cryotherapy;Electrical Stimulation;Gait training;Ultrasound;Moist Heat;Stair training;Functional mobility training;Therapeutic activities;Therapeutic exercise;Balance training;Neuromuscular re-education;Manual techniques;Patient/family education;Passive range of motion;Taping    PT Next Visit Plan  Cont to progress per tolerance and slow progression     Consulted and Agree with Plan of Care  Patient  Patient will benefit from skilled therapeutic intervention in order to improve the following deficits and impairments:  Abnormal gait, Decreased activity tolerance, Decreased mobility, Decreased range of motion, Decreased strength, Postural dysfunction, Pain, Improper body mechanics, Difficulty walking  Visit Diagnosis: Chronic bilateral low back pain without  sciatica  Difficulty in walking, not elsewhere classified  Muscle weakness (generalized)  Chronic midline low back pain with left-sided sciatica     Problem List Patient Active Problem List   Diagnosis Date Noted  . Elevated fasting glucose 09/28/2015  . Pain medication agreement completed 09/28/2015  . Opioid type dependence, continuous ([place]) 09/28/2015  . Gastroesophageal reflux disease without esophagitis 08/05/2015  . Spinal stenosis of lumbar region 04/29/2015  . Hypogonadism male 09/22/2014  . Hyperlipemia 09/22/2014    Laterrance Nauta P, PTA 06/11/2018, 9:56 AM  [facility] [place], [place], [address] Phone: [phone]   Fax:  [phone]  Name: Cesar Leon MRN: [id number] Date of Birth: Nov 10, 1960

## 2018-06-14 ENCOUNTER — Ambulatory Visit: Payer: Federal, State, Local not specified - PPO | Admitting: *Deleted

## 2018-06-14 DIAGNOSIS — G8929 Other chronic pain: Secondary | ICD-10-CM

## 2018-06-14 DIAGNOSIS — R262 Difficulty in walking, not elsewhere classified: Secondary | ICD-10-CM | POA: Diagnosis not present

## 2018-06-14 DIAGNOSIS — M545 Low back pain: Principal | ICD-10-CM

## 2018-06-14 DIAGNOSIS — M5442 Lumbago with sciatica, left side: Secondary | ICD-10-CM | POA: Diagnosis not present

## 2018-06-14 DIAGNOSIS — M6281 Muscle weakness (generalized): Secondary | ICD-10-CM

## 2018-06-14 NOTE — Therapy (Signed)
[facility] [place], [place], [address] Phone: [phone]   Fax:  [phone]  Physical Therapy Treatment  Patient Details  Name: Cesar Leon MRN: [id number] Date of Birth: Sep 27, 1960 Referring Provider (PT): Grier Rocher PA   Encounter Date: 06/14/2018  PT End of Session - 06/14/18 0944    Visit Number  15    Number of Visits  16    Date for PT Re-Evaluation  07/16/18    Authorization Type  BCBS    PT Start Time  0900    PT Stop Time  0951    PT Time Calculation (min)  51 min       Past Medical History:  Diagnosis Date  . Allergic rhinitis   . Chronic pain   . DDD (degenerative disc disease) 12/28/08  . Femur fracture, right ([place])   . GERD (gastroesophageal reflux disease)   . Hematuria   . History of GI bleed   . Hyperlipidemia   . Hypogonadism male   . Hypothyroidism   . Internal hemorrhoid   . PVD (peripheral vascular disease) ([place])     Past Surgical History:  Procedure Laterality Date  . CARPAL TUNNEL RELEASE  8/11  . right ankle   1976    There were no vitals filed for this visit.  Subjective Assessment - 06/14/18 0910    Subjective  No pain.today. Just soreness and tightness    Patient is accompained by:  Family member    Pertinent History  DDD; right femoral fracture; CTS; right ankle surgery, s/p lumbar fusion on 01/15/18    Limitations  House hold activities;Other (comment);Lifting    How long can you walk comfortably?  I'm walking about 2 miles each day, pt reporting his left knee has been surgery,     Diagnostic tests  X-rays following surgery last one on 04/17/18.     Patient Stated Goals  Get back to work    Currently in Pain?  Yes    Pain Orientation  Lower    Pain Descriptors / Indicators  Tightness    Pain Onset  More than a month ago                       [facility] - 06/14/18 0001      Exercises   Exercises  Lumbar;Knee/Hip      Lumbar Exercises: Aerobic   Elliptical  L1 R1 x 64mn    Nustep  L6 x15 min LE activity      Lumbar Exercises: Standing   Other Standing Lumbar Exercises  standing 6# reachouts2 x 10/  diagnols2 x10      Lumbar Exercises: Seated   Sit to Stand  --   6# ball     Knee/Hip Exercises: Standing   Other Standing Knee Exercises  Power lift from waist to floor with 20# box 2 x 10      Moist Heat Therapy   Number Minutes Moist Heat  15 Minutes    Moist Heat Location  Lumbar Spine      Electrical Stimulation   Electrical Stimulation Location  bilateral lumbar paraspinals IFC 80-'150hz'$  x 15 mins    Electrical Stimulation Goals  Pain               PT Short Term Goals - 05/14/18 01607     PT SHORT TERM GOAL #1   Title  Pt will be independent with proper sitting  and standing posture.     Baseline  pt currently wearing a lumbar corset to maintain proper back posture.     Time  3    Period  Weeks    Status  Achieved   Patient not wearing brace as much and able to maintain proper standing posture 05/14/18     PT SHORT TERM GOAL #2   Title  Pt will be independent in his initial HEP.     Time  4    Period  Weeks    Status  Achieved        PT Long Term Goals - 06/11/18 0932      PT LONG TERM GOAL #1   Title  Pt will be independent in HEP and progression    Time  8    Period  Weeks    Status  Achieved      PT LONG TERM GOAL #2   Title  Pt able to demonstrate proper lifting body mechanics when lifting >/= 25# from floor.     Time  8    Period  Weeks    Status  On-going   20# today with good technique 06/11/18     PT LONG TERM GOAL #3   Title  Pt will improve his FOTO score from 74% limiation to 54% limitation.     Baseline  Met 05/23/18    Time  8    Period  Weeks    Status  Achieved      PT LONG TERM GOAL #4   Title  Pt will improve his 5 time sit to stand </= 15 seconds in order to improve functional mobility.     Baseline  37.12 seconds with UE support on  04/23/18    Time  8    Period  Weeks    Status  Achieved   Met 06/11/18     PT LONG TERM GOAL #5   Title  Pt to report no pain when amb for 20 minutes community surfaces.     Time  8    Period  Weeks    Status  Achieved            Plan - 06/14/18 0945    Clinical Impression Statement  Pt arrived today feeling tightness and soreness in LB. He was able to perform core exs as well as functional act.'s for lifting for preporation for return to work hopefully next month. His CC is still mainly LB tightness and soreness. Normal modality response today with current  LTGs ongoing    Rehab Potential  Good    Clinical Impairments Affecting Rehab Potential  10th visit 40% limitation    PT Frequency  2x / week    PT Duration  8 weeks    PT Treatment/Interventions  ADLs/Self Care Home Management;Cryotherapy;Electrical Stimulation;Gait training;Ultrasound;Moist Heat;Stair training;Functional mobility training;Therapeutic activities;Therapeutic exercise;Balance training;Neuromuscular re-education;Manual techniques;Patient/family education;Passive range of motion;Taping    PT Next Visit Plan  Cont to progress per tolerance and slow progression     PT Home Exercise Plan  sit to stand, walking progression    Consulted and Agree with Plan of Care  Patient       Patient will benefit from skilled therapeutic intervention in order to improve the following deficits and impairments:  Abnormal gait, Decreased activity tolerance, Decreased mobility, Decreased range of motion, Decreased strength, Postural dysfunction, Pain, Improper body mechanics, Difficulty walking  Visit Diagnosis: Chronic bilateral low back pain without sciatica  Difficulty in  walking, not elsewhere classified  Muscle weakness (generalized)  Chronic midline low back pain with left-sided sciatica     Problem List Patient Active Problem List   Diagnosis Date Noted  . Elevated fasting glucose 09/28/2015  . Pain medication  agreement completed 09/28/2015  . Opioid type dependence, continuous ([place]) 09/28/2015  . Gastroesophageal reflux disease without esophagitis 08/05/2015  . Spinal stenosis of lumbar region 04/29/2015  . Hypogonadism male 09/22/2014  . Hyperlipemia 09/22/2014    Malikhi Ogan,CHRIS, PTA 06/14/2018, 10:15 AM  [facility] [place], [place], [address] Phone: [phone]   Fax:  [phone]  Name: ADEMIDE SCHABERG MRN: [id number] Date of Birth: 03/15/1961

## 2018-06-18 ENCOUNTER — Ambulatory Visit: Payer: Federal, State, Local not specified - PPO | Admitting: Physical Therapy

## 2018-06-18 ENCOUNTER — Encounter: Payer: Self-pay | Admitting: Physical Therapy

## 2018-06-18 ENCOUNTER — Ambulatory Visit (INDEPENDENT_AMBULATORY_CARE_PROVIDER_SITE_OTHER): Payer: Federal, State, Local not specified - PPO | Admitting: *Deleted

## 2018-06-18 DIAGNOSIS — M545 Low back pain: Secondary | ICD-10-CM | POA: Diagnosis not present

## 2018-06-18 DIAGNOSIS — M5442 Lumbago with sciatica, left side: Secondary | ICD-10-CM | POA: Diagnosis not present

## 2018-06-18 DIAGNOSIS — G8929 Other chronic pain: Secondary | ICD-10-CM | POA: Diagnosis not present

## 2018-06-18 DIAGNOSIS — M6281 Muscle weakness (generalized): Secondary | ICD-10-CM

## 2018-06-18 DIAGNOSIS — E349 Endocrine disorder, unspecified: Secondary | ICD-10-CM

## 2018-06-18 DIAGNOSIS — R262 Difficulty in walking, not elsewhere classified: Secondary | ICD-10-CM | POA: Diagnosis not present

## 2018-06-18 NOTE — Therapy (Signed)
[facility] [place], [place], [address] Phone: [phone]   Fax:  [phone]  Physical Therapy Treatment/Discharge  Patient Details  Name: Cesar Leon MRN: [id number] Date of Birth: 1960/07/15 Referring Provider (PT): Grier Rocher PA   Encounter Date: 06/18/2018  PT End of Session - 06/18/18 0939    Visit Number  16    Number of Visits  16    Date for PT Re-Evaluation  07/16/18    Authorization Type  BCBS    PT Start Time  0901    PT Stop Time  0957    PT Time Calculation (min)  56 min    Activity Tolerance  Patient tolerated treatment well    Behavior During Therapy  [facility] for tasks assessed/performed       Past Medical History:  Diagnosis Date  . Allergic rhinitis   . Chronic pain   . DDD (degenerative disc disease) 12/28/08  . Femur fracture, right ([place])   . GERD (gastroesophageal reflux disease)   . Hematuria   . History of GI bleed   . Hyperlipidemia   . Hypogonadism male   . Hypothyroidism   . Internal hemorrhoid   . PVD (peripheral vascular disease) ([place])     Past Surgical History:  Procedure Laterality Date  . CARPAL TUNNEL RELEASE  8/11  . right ankle   1976    There were no vitals filed for this visit.  Subjective Assessment - 06/18/18 0914    Subjective  No new complaints    Pertinent History  DDD; right femoral fracture; CTS; right ankle surgery, s/p lumbar fusion on 01/15/18    Limitations  House hold activities;Other (comment);Lifting    How long can you walk comfortably?  I'm walking about 2 miles each day, pt reporting his left knee has been surgery,     Diagnostic tests  X-rays following surgery last one on 04/17/18.     Patient Stated Goals  Get back to work    Currently in Pain?  Yes    Pain Score  1     Pain Location  Back    Pain Orientation  Lower    Pain Descriptors / Indicators  Tightness    Pain Type  Surgical pain    Pain Onset  More than a month ago    Pain  Frequency  Constant    Aggravating Factors   bending    Pain Relieving Factors  at rest                       [facility] Adult PT Treatment/Exercise - 06/18/18 0001      Lumbar Exercises: Aerobic   Elliptical  L1 R1 x 61mn    Nustep  L6 x15 min LE activity      Lumbar Exercises: Standing   Shoulder Extension  Strengthening;Both;20 reps;15 reps;5 reps    Shoulder Extension Limitations  orange XTS    Other Standing Lumbar Exercises  standing 6# reachouts2 x 10/  diagnols2 x10      Lumbar Exercises: Seated   Sit to Stand  20 reps   w 6# ball     Lumbar Exercises: Supine   Bridge with clamshell  20 reps;3 seconds    Bridge with [employer]  green t-band      Knee/Hip Exercises: Standing   Other Standing Knee Exercises  Power lift from waist to floor with 25# box 2 x 10  Moist Heat Therapy   Number Minutes Moist Heat  15 Minutes    Moist Heat Location  Lumbar Spine      Electrical Stimulation   Electrical Stimulation Location  bilateral lumbar paraspinals IFC 80-150hz x 15 mins    Electrical Stimulation Goals  Pain               PT Short Term Goals - 05/14/18 0928      PT SHORT TERM GOAL #1   Title  Pt will be independent with proper sitting and standing posture.     Baseline  pt currently wearing a lumbar corset to maintain proper back posture.     Time  3    Period  Weeks    Status  Achieved   Patient not wearing brace as much and able to maintain proper standing posture 05/14/18     PT SHORT TERM GOAL #2   Title  Pt will be independent in his initial HEP.     Time  4    Period  Weeks    Status  Achieved        PT Long Term Goals - 06/18/18 1700      PT LONG TERM GOAL #1   Title  Pt will be independent in HEP and progression    Time  8    Period  Weeks    Status  Achieved      PT LONG TERM GOAL #2   Title  Pt able to demonstrate proper lifting body mechanics when lifting >/= 25# from floor.     Time  8    Period   Weeks    Status  Achieved   06/18/18     PT LONG TERM GOAL #3   Title  Pt will improve his FOTO score from 74% limiation to 54% limitation.     Baseline  Met 05/23/18    Time  8    Period  Weeks    Status  Achieved      PT LONG TERM GOAL #4   Title  Pt will improve his 5 time sit to stand </= 15 seconds in order to improve functional mobility.     Time  8    Period  Weeks    Status  Achieved      PT LONG TERM GOAL #5   Title  Pt to report no pain when amb for 20 minutes community surfaces.     Time  8    Period  Weeks    Status  Achieved            Plan - 06/18/18 0929    Clinical Impression Statement  Patient tolerated treatment well today and has met all current goals. Patient will return to work light duty.     Rehab Potential  Good    PT Frequency  2x / week    PT Duration  8 weeks    PT Treatment/Interventions  ADLs/Self Care Home Management;Cryotherapy;Electrical Stimulation;Gait training;Ultrasound;Moist Heat;Stair training;Functional mobility training;Therapeutic activities;Therapeutic exercise;Balance training;Neuromuscular re-education;Manual techniques;Patient/family education;Passive range of motion;Taping    PT Next Visit Plan  DC    Consulted and Agree with Plan of Care  Patient       Patient will benefit from skilled therapeutic intervention in order to improve the following deficits and impairments:  Abnormal gait, Decreased activity tolerance, Decreased mobility, Decreased range of motion, Decreased strength, Postural dysfunction, Pain, Improper body mechanics, Difficulty walking  Visit Diagnosis: Chronic bilateral low  back pain without sciatica  Difficulty in walking, not elsewhere classified  Muscle weakness (generalized)  Chronic midline low back pain with left-sided sciatica     Problem List Patient Active Problem List   Diagnosis Date Noted  . Elevated fasting glucose 09/28/2015  . Pain medication agreement completed 09/28/2015  .  Opioid type dependence, continuous ([place]) 09/28/2015  . Gastroesophageal reflux disease without esophagitis 08/05/2015  . Spinal stenosis of lumbar region 04/29/2015  . Hypogonadism male 09/22/2014  . Hyperlipemia 09/22/2014    Ladean Raya, PTA 06/18/18 10:25 AM   [facility] [address] [place], [place], [address] Phone: [phone]   Fax:  [phone]  Name: Cesar Leon MRN: [id number] Date of Birth: 02-27-1961  PHYSICAL THERAPY DISCHARGE SUMMARY  Visits from Start of Care: 18  Current functional level related to goals / functional outcomes: See above   Remaining deficits: All goals met   Education / Equipment: HEP Plan: Patient agrees to discharge.  Patient goals were met. Patient is being discharged due to meeting the stated rehab goals.  ?????

## 2018-06-18 NOTE — Progress Notes (Signed)
Pt given Testosterone inj Tolerated well 

## 2018-06-21 ENCOUNTER — Encounter: Payer: Federal, State, Local not specified - PPO | Admitting: Physical Therapy

## 2018-07-02 ENCOUNTER — Ambulatory Visit (INDEPENDENT_AMBULATORY_CARE_PROVIDER_SITE_OTHER): Payer: Federal, State, Local not specified - PPO | Admitting: *Deleted

## 2018-07-02 DIAGNOSIS — E349 Endocrine disorder, unspecified: Secondary | ICD-10-CM | POA: Diagnosis not present

## 2018-07-02 NOTE — Progress Notes (Signed)
Pt given Testosterone inj Tolerated well 

## 2018-07-16 ENCOUNTER — Ambulatory Visit (INDEPENDENT_AMBULATORY_CARE_PROVIDER_SITE_OTHER): Payer: Federal, State, Local not specified - PPO | Admitting: *Deleted

## 2018-07-16 DIAGNOSIS — E349 Endocrine disorder, unspecified: Secondary | ICD-10-CM | POA: Diagnosis not present

## 2018-07-16 NOTE — Progress Notes (Signed)
Pt given Testosterone inj Tolerated well 

## 2018-07-17 DIAGNOSIS — M4326 Fusion of spine, lumbar region: Secondary | ICD-10-CM | POA: Diagnosis not present

## 2018-07-23 DIAGNOSIS — K080 Exfoliation of teeth due to systemic causes: Secondary | ICD-10-CM | POA: Diagnosis not present

## 2018-07-30 ENCOUNTER — Other Ambulatory Visit: Payer: Self-pay | Admitting: *Deleted

## 2018-07-30 ENCOUNTER — Ambulatory Visit (INDEPENDENT_AMBULATORY_CARE_PROVIDER_SITE_OTHER): Payer: Federal, State, Local not specified - PPO | Admitting: *Deleted

## 2018-07-30 DIAGNOSIS — E349 Endocrine disorder, unspecified: Secondary | ICD-10-CM

## 2018-07-30 MED ORDER — TESTOSTERONE CYPIONATE 200 MG/ML IM SOLN: INTRAMUSCULAR | 2 refills | Status: DC

## 2018-07-30 NOTE — Telephone Encounter (Signed)
Pt requesting refill of Testosterone Please review and advise

## 2018-07-30 NOTE — Progress Notes (Signed)
Pt given Testosterone inj Tolerated well 

## 2018-08-06 ENCOUNTER — Other Ambulatory Visit: Payer: Self-pay | Admitting: Family Medicine

## 2018-08-13 ENCOUNTER — Ambulatory Visit (INDEPENDENT_AMBULATORY_CARE_PROVIDER_SITE_OTHER): Payer: Federal, State, Local not specified - PPO | Admitting: *Deleted

## 2018-08-13 DIAGNOSIS — E349 Endocrine disorder, unspecified: Secondary | ICD-10-CM | POA: Diagnosis not present

## 2018-08-13 NOTE — Progress Notes (Signed)
Pt given Testosterone inj Tolerated well 

## 2018-08-24 ENCOUNTER — Ambulatory Visit: Payer: Self-pay | Admitting: Family Medicine

## 2018-08-27 ENCOUNTER — Ambulatory Visit (INDEPENDENT_AMBULATORY_CARE_PROVIDER_SITE_OTHER): Payer: Federal, State, Local not specified - PPO | Admitting: *Deleted

## 2018-08-27 DIAGNOSIS — E349 Endocrine disorder, unspecified: Secondary | ICD-10-CM | POA: Diagnosis not present

## 2018-08-27 NOTE — Progress Notes (Signed)
Pt given testosterone inj Tolerated well 

## 2018-09-10 ENCOUNTER — Ambulatory Visit (INDEPENDENT_AMBULATORY_CARE_PROVIDER_SITE_OTHER): Payer: Federal, State, Local not specified - PPO | Admitting: *Deleted

## 2018-09-10 ENCOUNTER — Other Ambulatory Visit: Payer: Self-pay

## 2018-09-10 DIAGNOSIS — E349 Endocrine disorder, unspecified: Secondary | ICD-10-CM | POA: Diagnosis not present

## 2018-09-10 NOTE — Progress Notes (Signed)
Pt given Testosterone inj Tolerated well 

## 2018-09-25 ENCOUNTER — Ambulatory Visit (INDEPENDENT_AMBULATORY_CARE_PROVIDER_SITE_OTHER): Payer: Federal, State, Local not specified - PPO | Admitting: *Deleted

## 2018-09-25 ENCOUNTER — Other Ambulatory Visit: Payer: Self-pay

## 2018-09-25 DIAGNOSIS — E349 Endocrine disorder, unspecified: Secondary | ICD-10-CM | POA: Diagnosis not present

## 2018-09-25 NOTE — Progress Notes (Signed)
Pt given Testosterone inj Tolerated well 

## 2018-10-08 ENCOUNTER — Other Ambulatory Visit: Payer: Self-pay | Admitting: Family Medicine

## 2018-10-08 DIAGNOSIS — R21 Rash and other nonspecific skin eruption: Secondary | ICD-10-CM

## 2018-10-08 DIAGNOSIS — J301 Allergic rhinitis due to pollen: Secondary | ICD-10-CM

## 2018-10-09 ENCOUNTER — Other Ambulatory Visit: Payer: Self-pay

## 2018-10-09 ENCOUNTER — Ambulatory Visit (INDEPENDENT_AMBULATORY_CARE_PROVIDER_SITE_OTHER): Payer: Federal, State, Local not specified - PPO | Admitting: *Deleted

## 2018-10-09 DIAGNOSIS — E349 Endocrine disorder, unspecified: Secondary | ICD-10-CM

## 2018-10-09 NOTE — Progress Notes (Signed)
Pt given Testosterone inj Tolerated well 

## 2018-10-23 ENCOUNTER — Other Ambulatory Visit: Payer: Self-pay

## 2018-10-23 ENCOUNTER — Ambulatory Visit (INDEPENDENT_AMBULATORY_CARE_PROVIDER_SITE_OTHER): Payer: Federal, State, Local not specified - PPO | Admitting: *Deleted

## 2018-10-23 DIAGNOSIS — E349 Endocrine disorder, unspecified: Secondary | ICD-10-CM

## 2018-10-23 NOTE — Progress Notes (Signed)
Pt given Testosterone inj Tolerated well 

## 2018-11-02 ENCOUNTER — Ambulatory Visit (INDEPENDENT_AMBULATORY_CARE_PROVIDER_SITE_OTHER): Payer: Federal, State, Local not specified - PPO | Admitting: Family Medicine

## 2018-11-02 ENCOUNTER — Encounter: Payer: Self-pay | Admitting: Family Medicine

## 2018-11-02 ENCOUNTER — Other Ambulatory Visit: Payer: Self-pay

## 2018-11-02 DIAGNOSIS — M1710 Unilateral primary osteoarthritis, unspecified knee: Secondary | ICD-10-CM

## 2018-11-02 DIAGNOSIS — K219 Gastro-esophageal reflux disease without esophagitis: Secondary | ICD-10-CM | POA: Diagnosis not present

## 2018-11-02 DIAGNOSIS — E349 Endocrine disorder, unspecified: Secondary | ICD-10-CM | POA: Diagnosis not present

## 2018-11-02 DIAGNOSIS — J301 Allergic rhinitis due to pollen: Secondary | ICD-10-CM | POA: Diagnosis not present

## 2018-11-02 MED ORDER — OMEPRAZOLE 40 MG PO CPDR: DELAYED_RELEASE_CAPSULE | ORAL | 1 refills | Status: DC

## 2018-11-02 MED ORDER — TESTOSTERONE CYPIONATE 200 MG/ML IM SOLN: INTRAMUSCULAR | 2 refills | Status: DC

## 2018-11-02 MED ORDER — ATORVASTATIN CALCIUM 40 MG PO TABS: ORAL_TABLET | ORAL | 1 refills | Status: DC

## 2018-11-02 MED ORDER — CETIRIZINE HCL 10 MG PO TABS: ORAL_TABLET | ORAL | 0 refills | Status: DC

## 2018-11-02 MED ORDER — MONTELUKAST SODIUM 10 MG PO TABS: 10.0000 mg | ORAL_TABLET | Freq: Every day | ORAL | 1 refills | Status: DC

## 2018-11-02 NOTE — Progress Notes (Signed)
Subjective:    Patient ID: Cesar Leon, male    DOB: May 09, 1961, 58 y.o.   MRN: [id number]   HPI: Cesar Leon is a 58 y.o. male presenting for pain 1/10.  Now off all of his pain meds for 4 months. 1/10 at lower back. Still uses laxative to keep bowels regular though. Additionally he has DCed diclofenac. Left knee pain is significant Has been told he could have replacement. Trying to hold off.   Follow up for testosterone deficiency: Pt. Using medication as directed. Denies any sx referrable to DVT such as edema or erythema of legs. No dyspnea or chest pain. Energy level reported as being good. Libido is normal and denies E.D. Feels strength is adequate and improved from baseline.   Patient has allergic rhinitis symptoms including sneezing frequently sniffling, clear rhinorrhea, watery and itchy eyes. There has been no fever no chills no sweats. No earaches. There is some scratchy throat but no sore throat or difficulty swallowing. There is some nasal congestion.  Patient in for follow-up of elevated cholesterol. Doing well without complaints on current medication. Denies side effects of statin including myalgia and arthralgia and nausea. Also in today for liver function testing. Currently no chest pain, shortness of breath or other cardiovascular related symptoms noted.     Self taken vitals: T 97.0 Wt. 218 BP114/72 P 58  Lot of pain in left knee - replacement due. Pt. Trying to wait until absolutely necessary.  Depression screen [facility] 2/9 05/22/2018 05/04/2018 03/29/2018 02/07/2018 01/04/2018  Decreased Interest 0 0 1 0 0  Down, Depressed, Hopeless 0 0 0 0 0  PHQ - 2 Score 0 0 1 0 0     Relevant past medical, surgical, family and social history reviewed and updated as indicated.  Interim medical history since our last visit reviewed. Allergies and medications reviewed and updated.  ROS:  Review of Systems  Constitutional: Negative.   HENT: Positive for congestion  and postnasal drip.   Eyes: Negative for visual disturbance.  Respiratory: Negative for cough and shortness of breath.   Cardiovascular: Negative for chest pain and leg swelling.  Gastrointestinal: Negative for abdominal pain, diarrhea, nausea and vomiting.  Genitourinary: Negative for difficulty urinating.  Musculoskeletal: Positive for arthralgias (left knee). Negative for myalgias.  Skin: Negative for rash.  Neurological: Negative for headaches.  Psychiatric/Behavioral: Negative for sleep disturbance.     Social History   Tobacco Use  Smoking Status Former Smoker  . Packs/day: 2.00  . Years: 15.00  . Pack years: 30.00  . Last attempt to quit: 06/27/1994  . Years since quitting: 24.3  Smokeless Tobacco Never Used       Objective:     Wt Readings from Last 3 Encounters:  05/22/18 239 lb (108.4 kg)  05/04/18 241 lb (109.3 kg)  03/29/18 238 lb (108 kg)     Exam deferred. Pt. Harboring due to COVID 19. Phone visit performed.   Assessment & Plan:   1. Arthritis of knee   2. Non-seasonal allergic rhinitis due to pollen   3. Testosterone deficiency   4. Gastroesophageal reflux disease without esophagitis     Meds ordered this encounter  Medications  . atorvastatin (LIPITOR) 40 MG tablet    Sig: TAKE ONE (1) TABLET EACH DAY    Dispense:  90 tablet    Refill:  1  . cetirizine (ZYRTEC) 10 MG tablet    Sig: TAKE ONE (1) TABLET EACH DAY    Dispense:  90 tablet    Refill:  0  . testosterone cypionate (DEPOTESTOSTERONE CYPIONATE) 200 MG/ML injection    Sig: INJECT 1ML EVERY 2 WEEKS    Dispense:  10 mL    Refill:  2  . omeprazole (PRILOSEC) 40 MG capsule    Sig: TAKE ONE (1) CAPSULE EACH DAY    Dispense:  90 capsule    Refill:  1  . montelukast (SINGULAIR) 10 MG tablet    Sig: Take 1 tablet (10 mg total) by mouth at bedtime.    Dispense:  90 tablet    Refill:  1    No orders of the defined types were placed in this encounter.     Diagnoses and all orders for  this visit:  Arthritis of knee  Non-seasonal allergic rhinitis due to pollen -     cetirizine (ZYRTEC) 10 MG tablet; TAKE ONE (1) TABLET EACH DAY  Testosterone deficiency  Gastroesophageal reflux disease without esophagitis  Other orders -     atorvastatin (LIPITOR) 40 MG tablet; TAKE ONE (1) TABLET EACH DAY -     testosterone cypionate (DEPOTESTOSTERONE CYPIONATE) 200 MG/ML injection; INJECT 1ML EVERY 2 WEEKS -     omeprazole (PRILOSEC) 40 MG capsule; TAKE ONE (1) CAPSULE EACH DAY -     montelukast (SINGULAIR) 10 MG tablet; Take 1 tablet (10 mg total) by mouth at bedtime.    Virtual Visit via telephone Note  I discussed the limitations, risks, security and privacy concerns of performing an evaluation and management service by telephone and the availability of in person appointments. The patient was identified with two identifiers. Pt.expressed understanding and agreed to proceed. Pt. Is at home. Dr. Darlyn ReadStacks is in his office.  Follow Up Instructions:   I discussed the assessment and treatment plan with the patient. The patient was provided an opportunity to ask questions and all were answered. The patient agreed with the plan and demonstrated an understanding of the instructions.   The patient was advised to call back or seek an in-person evaluation if the symptoms worsen or if the condition fails to improve as anticipated.   Total minutes including chart review and phone contact time: 30   Follow up plan: Return in about 6 months (around 05/05/2019).  Mechele ClaudeWarren Svara Twyman, MD [facility]

## 2018-11-05 ENCOUNTER — Other Ambulatory Visit: Payer: Self-pay

## 2018-11-06 ENCOUNTER — Other Ambulatory Visit: Payer: Self-pay

## 2018-11-06 ENCOUNTER — Ambulatory Visit (INDEPENDENT_AMBULATORY_CARE_PROVIDER_SITE_OTHER): Payer: Federal, State, Local not specified - PPO | Admitting: *Deleted

## 2018-11-06 DIAGNOSIS — E349 Endocrine disorder, unspecified: Secondary | ICD-10-CM

## 2018-11-06 MED ORDER — TESTOSTERONE CYPIONATE 200 MG/ML IM SOLN
200.0000 mg | INTRAMUSCULAR | Status: AC
  Administered 2018-11-06 – 2019-04-09 (×12): 200 mg via INTRAMUSCULAR

## 2018-11-06 NOTE — Progress Notes (Signed)
Pt given testosterone inj Tolerated well 

## 2018-11-20 ENCOUNTER — Other Ambulatory Visit: Payer: Self-pay

## 2018-11-20 ENCOUNTER — Ambulatory Visit (INDEPENDENT_AMBULATORY_CARE_PROVIDER_SITE_OTHER): Payer: Federal, State, Local not specified - PPO

## 2018-11-20 DIAGNOSIS — E349 Endocrine disorder, unspecified: Secondary | ICD-10-CM

## 2018-12-04 ENCOUNTER — Ambulatory Visit (INDEPENDENT_AMBULATORY_CARE_PROVIDER_SITE_OTHER): Payer: Federal, State, Local not specified - PPO | Admitting: *Deleted

## 2018-12-04 ENCOUNTER — Other Ambulatory Visit: Payer: Self-pay

## 2018-12-04 DIAGNOSIS — E349 Endocrine disorder, unspecified: Secondary | ICD-10-CM

## 2018-12-04 NOTE — Progress Notes (Signed)
Pt given Testosterone inj Tolerated well 

## 2018-12-07 ENCOUNTER — Other Ambulatory Visit: Payer: Self-pay | Admitting: Family Medicine

## 2018-12-18 ENCOUNTER — Ambulatory Visit (INDEPENDENT_AMBULATORY_CARE_PROVIDER_SITE_OTHER): Payer: Federal, State, Local not specified - PPO | Admitting: *Deleted

## 2018-12-18 ENCOUNTER — Other Ambulatory Visit: Payer: Self-pay

## 2018-12-18 DIAGNOSIS — E349 Endocrine disorder, unspecified: Secondary | ICD-10-CM | POA: Diagnosis not present

## 2018-12-18 NOTE — Progress Notes (Signed)
Testosterone injection given and patient tolerated well.  

## 2018-12-18 NOTE — Patient Instructions (Signed)
Testosterone injection  What is this medicine?  TESTOSTERONE (tes TOS ter one) is the main male hormone. It supports normal male development such as muscle growth, facial hair, and deep voice. It is used in males to treat low testosterone levels.  This medicine may be used for other purposes; ask your health care provider or pharmacist if you have questions.  COMMON BRAND NAME(S): Andro-L.A., Aveed, Delatestryl, Depo-Testosterone, Virilon  What should I tell my health care provider before I take this medicine?  They need to know if you have any of these conditions:  -cancer  -diabetes  -heart disease  -kidney disease  -liver disease  -lung disease  -prostate disease  -an unusual or allergic reaction to testosterone, other medicines, foods, dyes, or preservatives  -pregnant or trying to get pregnant  -breast-feeding  How should I use this medicine?  This medicine is for injection into a muscle. It is usually given by a health care professional in a hospital or clinic setting.  Contact your pediatrician regarding the use of this medicine in children. While this medicine may be prescribed for children as young as 12 years of age for selected conditions, precautions do apply.  Overdosage: If you think you have taken too much of this medicine contact a poison control center or emergency room at once.  NOTE: This medicine is only for you. Do not share this medicine with others.  What if I miss a dose?  Try not to miss a dose. Your doctor or health care professional will tell you when your next injection is due. Notify the office if you are unable to keep an appointment.  What may interact with this medicine?  -medicines for diabetes  -medicines that treat or prevent blood clots like warfarin  -oxyphenbutazone  -propranolol  -steroid medicines like prednisone or cortisone  This list may not describe all possible interactions. Give your health care provider a list of all the medicines, herbs, non-prescription drugs, or  dietary supplements you use. Also tell them if you smoke, drink alcohol, or use illegal drugs. Some items may interact with your medicine.  What should I watch for while using this medicine?  Visit your doctor or health care professional for regular checks on your progress. They will need to check the level of testosterone in your blood.  This medicine is only approved for use in men who have low levels of testosterone related to certain medical conditions. Heart attacks and strokes have been reported with the use of this medicine. Notify your doctor or health care professional and seek emergency treatment if you develop breathing problems; changes in vision; confusion; chest pain or chest tightness; sudden arm pain; severe, sudden headache; trouble speaking or understanding; sudden numbness or weakness of the face, arm or leg; loss of balance or coordination. Talk to your doctor about the risks and benefits of this medicine.  This medicine may affect blood sugar levels. If you have diabetes, check with your doctor or health care professional before you change your diet or the dose of your diabetic medicine.  Testosterone injections are not commonly used in women. Women should inform their doctor if they wish to become pregnant or think they might be pregnant. There is a potential for serious side effects to an unborn child. Talk to your health care professional or pharmacist for more information. Talk with your doctor or health care professional about your birth control options while taking this medicine.  This drug is banned from use in athletes   by most athletic organizations.  What side effects may I notice from receiving this medicine?  Side effects that you should report to your doctor or health care professional as soon as possible:  -allergic reactions like skin rash, itching or hives, swelling of the face, lips, or tongue  -breast enlargement  -breathing problems  -changes in emotions or moods  -deep or  hoarse voice  -irregular menstrual periods  -signs and symptoms of liver injury like dark yellow or brown urine; general ill feeling or flu-like symptoms; light-colored stools; loss of appetite; nausea; right upper belly pain; unusually weak or tired; yellowing of the eyes or skin  -stomach pain  -swelling of the ankles, feet, hands  -too frequent or persistent erections  -trouble passing urine or change in the amount of urine  Side effects that usually do not require medical attention (report to your doctor or health care professional if they continue or are bothersome):  -acne  -change in sex drive or performance  -facial hair growth  -hair loss  -headache  This list may not describe all possible side effects. Call your doctor for medical advice about side effects. You may report side effects to FDA at 1-800-FDA-1088.  Where should I keep my medicine?  Keep out of the reach of children. This medicine can be abused. Keep your medicine in a safe place to protect it from theft. Do not share this medicine with anyone. Selling or giving away this medicine is dangerous and against the law.  Store at room temperature between 20 and 25 degrees C (68 and 77 degrees F). Do not freeze. Protect from light. Follow the directions for the product you are prescribed. Throw away any unused medicine after the expiration date.  NOTE: This sheet is a summary. It may not cover all possible information. If you have questions about this medicine, talk to your doctor, pharmacist, or health care provider.   2019 Elsevier/Gold Standard (2015-07-18 07:33:55)

## 2018-12-31 ENCOUNTER — Other Ambulatory Visit: Payer: Self-pay

## 2019-01-01 ENCOUNTER — Ambulatory Visit (INDEPENDENT_AMBULATORY_CARE_PROVIDER_SITE_OTHER): Payer: Federal, State, Local not specified - PPO | Admitting: *Deleted

## 2019-01-01 DIAGNOSIS — E349 Endocrine disorder, unspecified: Secondary | ICD-10-CM

## 2019-01-01 NOTE — Progress Notes (Signed)
Pt given Testosterone inj Tolerated well 

## 2019-01-14 ENCOUNTER — Other Ambulatory Visit: Payer: Self-pay

## 2019-01-15 ENCOUNTER — Ambulatory Visit (INDEPENDENT_AMBULATORY_CARE_PROVIDER_SITE_OTHER): Payer: Federal, State, Local not specified - PPO | Admitting: *Deleted

## 2019-01-15 ENCOUNTER — Ambulatory Visit: Payer: Federal, State, Local not specified - PPO

## 2019-01-15 DIAGNOSIS — M4326 Fusion of spine, lumbar region: Secondary | ICD-10-CM | POA: Diagnosis not present

## 2019-01-15 DIAGNOSIS — E349 Endocrine disorder, unspecified: Secondary | ICD-10-CM

## 2019-01-15 NOTE — Progress Notes (Signed)
Pt given Testosterone inj Tolerated well 

## 2019-01-28 ENCOUNTER — Other Ambulatory Visit: Payer: Self-pay

## 2019-01-29 ENCOUNTER — Ambulatory Visit (INDEPENDENT_AMBULATORY_CARE_PROVIDER_SITE_OTHER): Payer: Federal, State, Local not specified - PPO | Admitting: *Deleted

## 2019-01-29 DIAGNOSIS — E349 Endocrine disorder, unspecified: Secondary | ICD-10-CM

## 2019-01-29 NOTE — Progress Notes (Signed)
Pt given Testosterone inj Tolerated well 

## 2019-02-11 ENCOUNTER — Other Ambulatory Visit: Payer: Self-pay

## 2019-02-12 ENCOUNTER — Ambulatory Visit (INDEPENDENT_AMBULATORY_CARE_PROVIDER_SITE_OTHER): Payer: Federal, State, Local not specified - PPO | Admitting: *Deleted

## 2019-02-12 DIAGNOSIS — E349 Endocrine disorder, unspecified: Secondary | ICD-10-CM | POA: Diagnosis not present

## 2019-02-12 NOTE — Progress Notes (Signed)
Pt given Testosterone inj Tolerated well 

## 2019-02-25 ENCOUNTER — Other Ambulatory Visit: Payer: Self-pay

## 2019-02-26 ENCOUNTER — Ambulatory Visit (INDEPENDENT_AMBULATORY_CARE_PROVIDER_SITE_OTHER): Payer: Federal, State, Local not specified - PPO | Admitting: *Deleted

## 2019-02-26 DIAGNOSIS — E349 Endocrine disorder, unspecified: Secondary | ICD-10-CM | POA: Diagnosis not present

## 2019-02-26 DIAGNOSIS — Z23 Encounter for immunization: Secondary | ICD-10-CM

## 2019-02-26 NOTE — Progress Notes (Signed)
Pt given Testosterone inj and flu vaccine Tolerated well 

## 2019-03-11 ENCOUNTER — Other Ambulatory Visit: Payer: Self-pay

## 2019-03-12 ENCOUNTER — Ambulatory Visit (INDEPENDENT_AMBULATORY_CARE_PROVIDER_SITE_OTHER): Payer: Federal, State, Local not specified - PPO | Admitting: *Deleted

## 2019-03-12 DIAGNOSIS — E349 Endocrine disorder, unspecified: Secondary | ICD-10-CM | POA: Diagnosis not present

## 2019-03-12 NOTE — Progress Notes (Signed)
Pt given Testosterone inj Tolerated well 

## 2019-03-21 ENCOUNTER — Other Ambulatory Visit: Payer: Self-pay

## 2019-03-21 ENCOUNTER — Emergency Department (HOSPITAL_BASED_OUTPATIENT_CLINIC_OR_DEPARTMENT_OTHER): Payer: Federal, State, Local not specified - PPO

## 2019-03-21 ENCOUNTER — Encounter (HOSPITAL_BASED_OUTPATIENT_CLINIC_OR_DEPARTMENT_OTHER): Payer: Self-pay

## 2019-03-21 ENCOUNTER — Emergency Department (HOSPITAL_BASED_OUTPATIENT_CLINIC_OR_DEPARTMENT_OTHER)
Admission: EM | Admit: 2019-03-21 | Discharge: 2019-03-21 | Disposition: A | Discharge status: Home / Self Care | Payer: Federal, State, Local not specified - PPO | Attending: Emergency Medicine | Admitting: Emergency Medicine

## 2019-03-21 DIAGNOSIS — R0781 Pleurodynia: Secondary | ICD-10-CM | POA: Diagnosis not present

## 2019-03-21 DIAGNOSIS — M79631 Pain in right forearm: Secondary | ICD-10-CM | POA: Insufficient documentation

## 2019-03-21 DIAGNOSIS — Y92410 Unspecified street and highway as the place of occurrence of the external cause: Secondary | ICD-10-CM | POA: Insufficient documentation

## 2019-03-21 DIAGNOSIS — Y999 Unspecified external cause status: Secondary | ICD-10-CM | POA: Diagnosis not present

## 2019-03-21 DIAGNOSIS — Z87891 Personal history of nicotine dependence: Secondary | ICD-10-CM | POA: Insufficient documentation

## 2019-03-21 DIAGNOSIS — Y9389 Activity, other specified: Secondary | ICD-10-CM | POA: Diagnosis not present

## 2019-03-21 DIAGNOSIS — R0789 Other chest pain: Secondary | ICD-10-CM | POA: Insufficient documentation

## 2019-03-21 DIAGNOSIS — M542 Cervicalgia: Secondary | ICD-10-CM | POA: Insufficient documentation

## 2019-03-21 DIAGNOSIS — M545 Low back pain: Secondary | ICD-10-CM | POA: Diagnosis not present

## 2019-03-21 DIAGNOSIS — Z79899 Other long term (current) drug therapy: Secondary | ICD-10-CM | POA: Insufficient documentation

## 2019-03-21 DIAGNOSIS — E039 Hypothyroidism, unspecified: Secondary | ICD-10-CM | POA: Insufficient documentation

## 2019-03-21 DIAGNOSIS — S59911A Unspecified injury of right forearm, initial encounter: Secondary | ICD-10-CM | POA: Diagnosis not present

## 2019-03-21 MED ORDER — HYDROCODONE-ACETAMINOPHEN 5-325 MG PO TABS: 1.0000 | ORAL_TABLET | ORAL | 0 refills | Status: DC | PRN

## 2019-03-21 MED ORDER — FENTANYL CITRATE (PF) 100 MCG/2ML IJ SOLN
25.0000 ug | Freq: Once | INTRAMUSCULAR | Status: AC
  Administered 2019-03-21: 23:00:00 25 ug via INTRAVENOUS
  Filled 2019-03-21: qty 2

## 2019-03-21 NOTE — ED Notes (Signed)
ED Provider at bedside. 
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
## 2019-03-21 NOTE — ED Notes (Signed)
Patient transported to X-ray 
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
## 2019-03-26 ENCOUNTER — Ambulatory Visit (INDEPENDENT_AMBULATORY_CARE_PROVIDER_SITE_OTHER): Payer: Federal, State, Local not specified - PPO

## 2019-03-26 ENCOUNTER — Ambulatory Visit: Payer: Federal, State, Local not specified - PPO | Admitting: Family Medicine

## 2019-03-26 ENCOUNTER — Encounter: Payer: Self-pay | Admitting: Family Medicine

## 2019-03-26 ENCOUNTER — Other Ambulatory Visit: Payer: Self-pay

## 2019-03-26 VITALS — BP 124/87 | HR 77 | Temp 99.8°F | Resp 20 | Ht 68.0 in | Wt 209.0 lb

## 2019-03-26 DIAGNOSIS — S20212A Contusion of left front wall of thorax, initial encounter: Secondary | ICD-10-CM | POA: Diagnosis not present

## 2019-03-26 DIAGNOSIS — S40012A Contusion of left shoulder, initial encounter: Secondary | ICD-10-CM

## 2019-03-26 DIAGNOSIS — E349 Endocrine disorder, unspecified: Secondary | ICD-10-CM

## 2019-03-26 DIAGNOSIS — S40021A Contusion of right upper arm, initial encounter: Secondary | ICD-10-CM | POA: Diagnosis not present

## 2019-03-26 MED ORDER — DICLOFENAC SODIUM 75 MG PO TBEC: 75.0000 mg | DELAYED_RELEASE_TABLET | Freq: Two times a day (BID) | ORAL | 0 refills | Status: DC
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
## 2019-03-26 NOTE — Progress Notes (Signed)
Testosterone injection given to left upper outer quadrant.  Patient tolerated well. 

## 2019-04-08 ENCOUNTER — Other Ambulatory Visit: Payer: Self-pay

## 2019-04-09 ENCOUNTER — Ambulatory Visit: Payer: Federal, State, Local not specified - PPO | Admitting: Family Medicine

## 2019-04-09 ENCOUNTER — Other Ambulatory Visit: Payer: Self-pay

## 2019-04-09 ENCOUNTER — Encounter: Payer: Self-pay | Admitting: Family Medicine

## 2019-04-09 ENCOUNTER — Ambulatory Visit: Payer: Federal, State, Local not specified - PPO

## 2019-04-09 VITALS — BP 132/67 | HR 66 | Temp 98.0°F | Ht 68.0 in | Wt 209.8 lb

## 2019-04-09 DIAGNOSIS — E349 Endocrine disorder, unspecified: Secondary | ICD-10-CM | POA: Diagnosis not present

## 2019-04-09 DIAGNOSIS — S40012D Contusion of left shoulder, subsequent encounter: Secondary | ICD-10-CM | POA: Diagnosis not present

## 2019-04-09 DIAGNOSIS — S90112A Contusion of left great toe without damage to nail, initial encounter: Secondary | ICD-10-CM

## 2019-04-09 DIAGNOSIS — S20212D Contusion of left front wall of thorax, subsequent encounter: Secondary | ICD-10-CM | POA: Diagnosis not present

## 2019-04-09 NOTE — Progress Notes (Signed)
Testosterone injection given to right upper outer quadrant.  Patient tolerated well. 
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
## 2019-04-23 ENCOUNTER — Ambulatory Visit (INDEPENDENT_AMBULATORY_CARE_PROVIDER_SITE_OTHER): Payer: Federal, State, Local not specified - PPO

## 2019-04-23 ENCOUNTER — Other Ambulatory Visit: Payer: Self-pay

## 2019-04-23 DIAGNOSIS — E349 Endocrine disorder, unspecified: Secondary | ICD-10-CM

## 2019-04-23 MED ORDER — TESTOSTERONE CYPIONATE 200 MG/ML IM SOLN
200.0000 mg | INTRAMUSCULAR | Status: DC
  Administered 2019-04-23 – 2020-08-12 (×34): 200 mg via INTRAMUSCULAR
  Administered 2020-08-26: 180 mg via INTRAMUSCULAR
  Administered 2020-09-09: 200 mg via INTRAMUSCULAR

## 2019-04-23 NOTE — Progress Notes (Signed)
Testosterone injection given to left upper outer quadrant.  Patient tolerated well. 

## 2019-05-03 ENCOUNTER — Other Ambulatory Visit: Payer: Self-pay | Admitting: Family Medicine

## 2019-05-03 ENCOUNTER — Other Ambulatory Visit: Payer: Self-pay

## 2019-05-06 ENCOUNTER — Other Ambulatory Visit: Payer: Self-pay

## 2019-05-06 ENCOUNTER — Encounter: Payer: Self-pay | Admitting: Family Medicine

## 2019-05-06 ENCOUNTER — Ambulatory Visit (INDEPENDENT_AMBULATORY_CARE_PROVIDER_SITE_OTHER): Payer: Federal, State, Local not specified - PPO | Admitting: Family Medicine

## 2019-05-06 VITALS — BP 124/79 | HR 66 | Temp 98.0°F | Ht 68.0 in | Wt 207.2 lb

## 2019-05-06 DIAGNOSIS — J301 Allergic rhinitis due to pollen: Secondary | ICD-10-CM

## 2019-05-06 DIAGNOSIS — Z0001 Encounter for general adult medical examination with abnormal findings: Secondary | ICD-10-CM | POA: Diagnosis not present

## 2019-05-06 DIAGNOSIS — E291 Testicular hypofunction: Secondary | ICD-10-CM

## 2019-05-06 DIAGNOSIS — Z125 Encounter for screening for malignant neoplasm of prostate: Secondary | ICD-10-CM

## 2019-05-06 DIAGNOSIS — E559 Vitamin D deficiency, unspecified: Secondary | ICD-10-CM

## 2019-05-06 DIAGNOSIS — E782 Mixed hyperlipidemia: Secondary | ICD-10-CM | POA: Diagnosis not present

## 2019-05-06 DIAGNOSIS — Z0000 Encounter for general adult medical examination without abnormal findings: Secondary | ICD-10-CM

## 2019-05-06 MED ORDER — BETAMETHASONE SOD PHOS & ACET 6 (3-3) MG/ML IJ SUSP
6.0000 mg | Freq: Once | INTRAMUSCULAR | Status: AC
  Administered 2019-05-06: 6 mg via INTRAMUSCULAR

## 2019-05-06 MED ORDER — TESTOSTERONE CYPIONATE 200 MG/ML IM SOLN: INTRAMUSCULAR | 2 refills | Status: DC

## 2019-05-06 MED ORDER — FEXOFENADINE-PSEUDOEPHED ER 180-240 MG PO TB24: 1.0000 | ORAL_TABLET | Freq: Every evening | ORAL | 11 refills | Status: DC

## 2019-05-06 MED ORDER — ATORVASTATIN CALCIUM 40 MG PO TABS: ORAL_TABLET | ORAL | 1 refills | Status: DC

## 2019-05-06 MED ORDER — OMEPRAZOLE 40 MG PO CPDR: DELAYED_RELEASE_CAPSULE | ORAL | 1 refills | Status: DC
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
## 2019-05-07 ENCOUNTER — Ambulatory Visit: Payer: Federal, State, Local not specified - PPO

## 2019-05-07 LAB — CMP14+EGFR
ALT: 22 IU/L (ref 0–44)
AST: 21 IU/L (ref 0–40)
Albumin/Globulin Ratio: 2 (ref 1.2–2.2)
Albumin: 4.3 g/dL (ref 3.8–4.9)
Alkaline Phosphatase: 76 IU/L (ref 39–117)
BUN/Creatinine Ratio: 17 (ref 9–20)
BUN: 17 mg/dL (ref 6–24)
Bilirubin Total: 0.9 mg/dL (ref 0.0–1.2)
CO2: 28 mmol/L (ref 20–29)
Calcium: 9.1 mg/dL (ref 8.7–10.2)
Chloride: 99 mmol/L (ref 96–106)
Creatinine, Ser: 1.03 mg/dL (ref 0.76–1.27)
GFR calc Af Amer: 91 mL/min/{1.73_m2} (ref >59)
GFR calc non Af Amer: 79 mL/min/{1.73_m2} (ref >59)
Globulin, Total: 2.1 g/dL (ref 1.5–4.5)
Glucose: 103 mg/dL — ABNORMAL HIGH (ref 65–99)
Potassium: 4.1 mmol/L (ref 3.5–5.2)
Sodium: 139 mmol/L (ref 134–144)
Total Protein: 6.4 g/dL (ref 6.0–8.5)

## 2019-05-07 LAB — LIPID PANEL
Chol/HDL Ratio: 2.6 ratio (ref 0.0–5.0)
Cholesterol, Total: 106 mg/dL (ref 100–199)
HDL: 41 mg/dL (ref >39)
LDL Chol Calc (NIH): 52 mg/dL (ref 0–99)
Triglycerides: 56 mg/dL (ref 0–149)
VLDL Cholesterol Cal: 13 mg/dL (ref 5–40)

## 2019-05-07 LAB — CBC WITH DIFFERENTIAL/PLATELET
Basophils Absolute: 0 10*3/uL (ref 0.0–0.2)
Basos: 0 %
EOS (ABSOLUTE): 0.1 10*3/uL (ref 0.0–0.4)
Eos: 1 %
Hematocrit: 49.5 % (ref 37.5–51.0)
Hemoglobin: 17.1 g/dL (ref 13.0–17.7)
Immature Grans (Abs): 0 10*3/uL (ref 0.0–0.1)
Immature Granulocytes: 0 %
Lymphocytes Absolute: 1.2 10*3/uL (ref 0.7–3.1)
Lymphs: 18 %
MCH: 30.9 pg (ref 26.6–33.0)
MCHC: 34.5 g/dL (ref 31.5–35.7)
MCV: 89 fL (ref 79–97)
Monocytes Absolute: 0.6 10*3/uL (ref 0.1–0.9)
Monocytes: 8 %
Neutrophils Absolute: 4.8 10*3/uL (ref 1.4–7.0)
Neutrophils: 73 %
Platelets: 262 10*3/uL (ref 150–450)
RBC: 5.54 x10E6/uL (ref 4.14–5.80)
RDW: 12.1 % (ref 11.6–15.4)
WBC: 6.7 10*3/uL (ref 3.4–10.8)

## 2019-05-07 LAB — PSA TOTAL (REFLEX TO FREE): Prostate Specific Ag, Serum: 0.7 ng/mL (ref 0.0–4.0)

## 2019-05-07 LAB — TESTOSTERONE,FREE AND TOTAL
Testosterone, Free: 10.5 pg/mL (ref 7.2–24.0)
Testosterone: 393 ng/dL (ref 264–916)

## 2019-05-07 LAB — VITAMIN D 25 HYDROXY (VIT D DEFICIENCY, FRACTURES): Vit D, 25-Hydroxy: 66.1 ng/mL (ref 30.0–100.0)

## 2019-05-07 NOTE — Progress Notes (Signed)
Hello Cesar Leon,  Your lab result is normal and/or stable.Some minor variations that are not significant are commonly marked abnormal, but do not represent any medical problem for you.  Best regards, Pinkie Manger, M.D.

## 2019-05-20 ENCOUNTER — Ambulatory Visit (INDEPENDENT_AMBULATORY_CARE_PROVIDER_SITE_OTHER): Payer: Federal, State, Local not specified - PPO

## 2019-05-20 ENCOUNTER — Other Ambulatory Visit: Payer: Self-pay

## 2019-05-20 DIAGNOSIS — E291 Testicular hypofunction: Secondary | ICD-10-CM

## 2019-05-20 DIAGNOSIS — E349 Endocrine disorder, unspecified: Secondary | ICD-10-CM

## 2019-05-20 NOTE — Progress Notes (Signed)
Testosterone injection given to right upper outer quadrant.  Patient tolerated well. 

## 2019-06-03 ENCOUNTER — Ambulatory Visit (INDEPENDENT_AMBULATORY_CARE_PROVIDER_SITE_OTHER): Payer: Federal, State, Local not specified - PPO

## 2019-06-03 ENCOUNTER — Other Ambulatory Visit: Payer: Self-pay

## 2019-06-03 DIAGNOSIS — E349 Endocrine disorder, unspecified: Secondary | ICD-10-CM | POA: Diagnosis not present

## 2019-06-03 DIAGNOSIS — E291 Testicular hypofunction: Secondary | ICD-10-CM | POA: Diagnosis not present

## 2019-06-03 NOTE — Progress Notes (Signed)
Testosterone injection given to left upper outer quadrant.  Patient tolerated well. 

## 2019-06-04 ENCOUNTER — Other Ambulatory Visit: Payer: Self-pay | Admitting: Family Medicine

## 2019-06-14 ENCOUNTER — Other Ambulatory Visit: Payer: Self-pay

## 2019-06-17 ENCOUNTER — Ambulatory Visit (INDEPENDENT_AMBULATORY_CARE_PROVIDER_SITE_OTHER): Payer: Federal, State, Local not specified - PPO | Admitting: *Deleted

## 2019-06-17 DIAGNOSIS — E349 Endocrine disorder, unspecified: Secondary | ICD-10-CM

## 2019-07-01 ENCOUNTER — Other Ambulatory Visit: Payer: Self-pay | Admitting: Family Medicine

## 2019-07-01 ENCOUNTER — Ambulatory Visit (INDEPENDENT_AMBULATORY_CARE_PROVIDER_SITE_OTHER): Payer: Federal, State, Local not specified - PPO | Admitting: *Deleted

## 2019-07-01 ENCOUNTER — Other Ambulatory Visit: Payer: Self-pay

## 2019-07-01 DIAGNOSIS — E349 Endocrine disorder, unspecified: Secondary | ICD-10-CM | POA: Diagnosis not present

## 2019-07-01 NOTE — Progress Notes (Signed)
Testosterone injection given and patient tolerated well.  

## 2019-07-06 ENCOUNTER — Other Ambulatory Visit: Payer: Self-pay | Admitting: Family Medicine

## 2019-07-12 ENCOUNTER — Other Ambulatory Visit: Payer: Self-pay

## 2019-07-15 ENCOUNTER — Other Ambulatory Visit: Payer: Self-pay

## 2019-07-15 ENCOUNTER — Ambulatory Visit (INDEPENDENT_AMBULATORY_CARE_PROVIDER_SITE_OTHER): Payer: Federal, State, Local not specified - PPO | Admitting: *Deleted

## 2019-07-15 DIAGNOSIS — E349 Endocrine disorder, unspecified: Secondary | ICD-10-CM

## 2019-07-15 NOTE — Progress Notes (Signed)
Testosterone injection given and tolerated well.  

## 2019-07-26 ENCOUNTER — Other Ambulatory Visit: Payer: Self-pay

## 2019-07-26 DIAGNOSIS — M961 Postlaminectomy syndrome, not elsewhere classified: Secondary | ICD-10-CM | POA: Diagnosis not present

## 2019-07-26 DIAGNOSIS — M5431 Sciatica, right side: Secondary | ICD-10-CM | POA: Diagnosis not present

## 2019-07-26 DIAGNOSIS — M4326 Fusion of spine, lumbar region: Secondary | ICD-10-CM | POA: Diagnosis not present

## 2019-07-26 DIAGNOSIS — M4726 Other spondylosis with radiculopathy, lumbar region: Secondary | ICD-10-CM | POA: Diagnosis not present

## 2019-07-29 ENCOUNTER — Other Ambulatory Visit: Payer: Self-pay

## 2019-07-29 ENCOUNTER — Ambulatory Visit (INDEPENDENT_AMBULATORY_CARE_PROVIDER_SITE_OTHER): Payer: Federal, State, Local not specified - PPO | Admitting: *Deleted

## 2019-07-29 DIAGNOSIS — E349 Endocrine disorder, unspecified: Secondary | ICD-10-CM

## 2019-07-30 ENCOUNTER — Other Ambulatory Visit: Payer: Self-pay | Admitting: Orthopedic Surgery

## 2019-07-30 DIAGNOSIS — M4726 Other spondylosis with radiculopathy, lumbar region: Secondary | ICD-10-CM

## 2019-08-02 ENCOUNTER — Other Ambulatory Visit: Payer: Self-pay | Admitting: Family Medicine

## 2019-08-05 ENCOUNTER — Other Ambulatory Visit: Payer: Self-pay

## 2019-08-06 ENCOUNTER — Encounter: Payer: Self-pay | Admitting: Family Medicine

## 2019-08-06 ENCOUNTER — Ambulatory Visit: Payer: Federal, State, Local not specified - PPO | Admitting: Family Medicine

## 2019-08-06 VITALS — BP 112/75 | HR 89 | Temp 99.5°F | Ht 68.0 in | Wt 199.4 lb

## 2019-08-06 DIAGNOSIS — M48061 Spinal stenosis, lumbar region without neurogenic claudication: Secondary | ICD-10-CM

## 2019-08-06 DIAGNOSIS — E782 Mixed hyperlipidemia: Secondary | ICD-10-CM

## 2019-08-06 DIAGNOSIS — K219 Gastro-esophageal reflux disease without esophagitis: Secondary | ICD-10-CM | POA: Diagnosis not present

## 2019-08-06 DIAGNOSIS — E119 Type 2 diabetes mellitus without complications: Secondary | ICD-10-CM

## 2019-08-06 DIAGNOSIS — J301 Allergic rhinitis due to pollen: Secondary | ICD-10-CM

## 2019-08-06 DIAGNOSIS — E291 Testicular hypofunction: Secondary | ICD-10-CM

## 2019-08-06 DIAGNOSIS — M4316 Spondylolisthesis, lumbar region: Secondary | ICD-10-CM | POA: Diagnosis not present

## 2019-08-06 DIAGNOSIS — M5431 Sciatica, right side: Secondary | ICD-10-CM

## 2019-08-06 MED ORDER — FEXOFENADINE-PSEUDOEPHED ER 180-240 MG PO TB24: 1.0000 | ORAL_TABLET | Freq: Every evening | ORAL | 11 refills | Status: DC

## 2019-08-06 MED ORDER — FLUTICASONE PROPIONATE 50 MCG/ACT NA SUSP: 2.0000 | Freq: Every day | NASAL | 5 refills | Status: DC

## 2019-08-06 MED ORDER — OMEPRAZOLE 40 MG PO CPDR: DELAYED_RELEASE_CAPSULE | ORAL | 1 refills | Status: DC

## 2019-08-06 MED ORDER — MONTELUKAST SODIUM 10 MG PO TABS: 10.0000 mg | ORAL_TABLET | Freq: Every day | ORAL | 1 refills | Status: DC

## 2019-08-06 NOTE — Progress Notes (Signed)
Subjective:  Patient ID: Cesar Leon, male    DOB: June 18, 1961  Age: 59 y.o. MRN: [id number]  CC: Follow-up (3 month)   HPI WILEY MAGAN presents for presents forFollow-up of diabetes. Patient does not check his blood sugar.  Patient denies symptoms such as polyuria, polydipsia, excessive hunger, nausea No significant hypoglycemic spells noted. Medications reviewed. Pt reports taking them regularly without complication/adverse reaction being reported today.  Checking feet daily.  HE is going back to his neurosurgeon due to new onset of right lower extremity pain.  Is worse when he stands for long time.  His neurosurgeon has ordered an MRI of the lumbar spine to check for disc disease.   Follow up for testosterone deficiency: Pt. Using medication as directed. Denies any sx referrable to DVT such as edema or erythema of legs. No dyspnea or chest pain. Energy level reported as being good. Libido is normal and denies E.D. Feels strength is adequate and improved from baseline.   Depression screen [facility] 2/9 08/06/2019 05/06/2019 04/09/2019  Decreased Interest 0 0 0  Down, Depressed, Hopeless 0 0 0  PHQ - 2 Score 0 0 0    History Steaven has a past medical history of Allergic rhinitis, Chronic pain, DDD (degenerative disc disease) (12/28/08), Femur fracture, right ([place]), GERD (gastroesophageal reflux disease), Hematuria, History of GI bleed, Hyperlipidemia, Hypogonadism male, Hypothyroidism, Internal hemorrhoid, and PVD (peripheral vascular disease) ([place]).   He has a past surgical history that includes Carpal tunnel release (8/11) and right ankle  (1976).   His family history includes Anxiety disorder in his father; Depression in his father; Diabetes in his brother and father; Lung cancer in his father; Peripheral vascular disease in his father.He reports that he quit smoking about 25 years ago. He has a 30.00 pack-year smoking history. He has never used smokeless tobacco. He reports  current alcohol use. He reports that he does not use drugs.    ROS Review of Systems  Constitutional: Negative for fever.  Respiratory: Negative for shortness of breath.   Cardiovascular: Negative for chest pain.  Musculoskeletal: Negative for arthralgias.  Skin: Negative for rash.    Objective:  BP 112/75   Pulse 89   Temp 99.5 F (37.5 C) (Temporal)   Ht '5\' 8"'$  (1.727 m)   Wt 199 lb 6.4 oz (90.4 kg)   BMI 30.32 kg/m   BP Readings from Last 3 Encounters:  08/06/19 112/75  05/06/19 124/79  04/09/19 132/67    Wt Readings from Last 3 Encounters:  08/06/19 199 lb 6.4 oz (90.4 kg)  05/06/19 207 lb 3.2 oz (94 kg)  04/09/19 209 lb 12.8 oz (95.2 kg)     Physical Exam Vitals reviewed.  Constitutional:      Appearance: He is well-developed.  HENT:     Head: Normocephalic and atraumatic.     Right Ear: Tympanic membrane and external ear normal. No decreased hearing noted.     Left Ear: Tympanic membrane and external ear normal. No decreased hearing noted.     Mouth/Throat:     Pharynx: No oropharyngeal exudate or posterior oropharyngeal erythema.  Eyes:     Pupils: Pupils are equal, round, and reactive to light.  Cardiovascular:     Rate and Rhythm: Normal rate and regular rhythm.     Heart sounds: No murmur.  Pulmonary:     Effort: No respiratory distress.     Breath sounds: Normal breath sounds.  Abdominal:     General: Bowel sounds  are normal.     Palpations: Abdomen is soft. There is no mass.     Tenderness: There is no abdominal tenderness.  Musculoskeletal:     Cervical back: Normal range of motion and neck supple.       Assessment & Plan:   Eamon was seen today for follow-up.  Diagnoses and all orders for this visit:  Hypogonadism male -     Testosterone,Free and Total -     CBC with Differential/Platelet -     CMP14+EGFR  Spondylolisthesis at L4-L5 level -     CBC with Differential/Platelet -     CMP14+EGFR  Spinal stenosis of lumbar  region, unspecified whether neurogenic claudication present -     CBC with Differential/Platelet -     CMP14+EGFR  Gastroesophageal reflux disease without esophagitis -     omeprazole (PRILOSEC) 40 MG capsule; TAKE ONE (1) CAPSULE EACH DAY -     CBC with Differential/Platelet -     CMP14+EGFR  Mixed hyperlipidemia -     CBC with Differential/Platelet -     CMP14+EGFR  Diabetes mellitus without complication (HCC) -     Bayer DCA Hb A1c Waived -     CBC with Differential/Platelet -     CMP14+EGFR  Sciatica of right side  Seasonal allergic rhinitis due to pollen -     fexofenadine-pseudoephedrine (ALLEGRA-D 24) 180-240 MG 24 hr tablet; Take 1 tablet by mouth every evening. For allergy and congestion -     montelukast (SINGULAIR) 10 MG tablet; Take 1 tablet (10 mg total) by mouth at bedtime. -     fluticasone (FLONASE) 50 MCG/ACT nasal spray; Place 2 sprays into both nostrils daily.   Patient's condition appears to be stable at this time overall.  There is concern for return of his disc disease.  He has not needed opiate-containing pain medication in about a year and a half.  If this sciatica turns out to be disc disease, hopefully it will not lead to further surgery or the use of opiates.  We will just have to leave definitive care to his neurosurgeon once the MRI is completed.    I have discontinued Kimothy Kishimoto. Fonner's predniSONE. I have also changed his montelukast and fluticasone. Additionally, I am having him maintain his Calcium Carb-Cholecalciferol (CALCIUM + D3 PO), Methylcellulose (Laxative), Multiple Vitamins-Minerals (CENTRUM SILVER PO), HYDROcodone-acetaminophen, GaviLAX, atorvastatin, testosterone cypionate, testosterone cypionate, diclofenac, fexofenadine-pseudoephedrine, and omeprazole. We will continue to administer testosterone cypionate.  Allergies as of 08/06/2019      Reactions   Metformin And Related Nausea And Vomiting   Codeine    Penicillins       Medication  List       Accurate as of August 06, 2019 10:15 PM. If you have any questions, ask your nurse or doctor.        STOP taking these medications   predniSONE 10 MG tablet Commonly known as: DELTASONE Stopped by: Claretta Fraise, MD     TAKE these medications   atorvastatin 40 MG tablet Commonly known as: LIPITOR TAKE ONE (1) TABLET EACH DAY   CALCIUM + D3 PO Take by mouth.   CENTRUM SILVER PO Take 1 tablet by mouth daily.   diclofenac 75 MG EC tablet Commonly known as: VOLTAREN TAKE ONE TABLET BY MOUTH TWICE DAILY   fexofenadine-pseudoephedrine 180-240 MG 24 hr tablet Commonly known as: ALLEGRA-D 24 Take 1 tablet by mouth every evening. For allergy and congestion   fluticasone 50 MCG/ACT nasal  spray Commonly known as: FLONASE Place 2 sprays into both nostrils daily. What changed: See the new instructions. Changed by: Claretta Fraise, MD   Sanjuan Dame 17 GM/SCOOP powder Generic drug: polyethylene glycol powder MIX 17 GRAMS INTO 8OZ OF WATER AND DRINKDAILY   HYDROcodone-acetaminophen 5-325 MG tablet Commonly known as: NORCO/VICODIN Take 1 tablet by mouth every 4 (four) hours as needed for moderate pain.   Methylcellulose (Laxative) 500 MG Tabs Take 2 tablets by mouth daily.   montelukast 10 MG tablet Commonly known as: SINGULAIR Take 1 tablet (10 mg total) by mouth at bedtime.   omeprazole 40 MG capsule Commonly known as: PRILOSEC TAKE ONE (1) CAPSULE EACH DAY   testosterone cypionate 200 MG/ML injection Commonly known as: DEPOTESTOSTERONE CYPIONATE INJECT 1ML EVERY 2 WEEKS   testosterone cypionate 200 MG/ML injection Commonly known as: DEPOTESTOSTERONE CYPIONATE INJECT 1ML EVERY 2 WEEKS        Follow-up: No follow-ups on file.  Claretta Fraise, M.D.

## 2019-08-08 ENCOUNTER — Other Ambulatory Visit: Payer: Federal, State, Local not specified - PPO

## 2019-08-08 ENCOUNTER — Other Ambulatory Visit: Payer: Self-pay

## 2019-08-08 DIAGNOSIS — E782 Mixed hyperlipidemia: Secondary | ICD-10-CM | POA: Diagnosis not present

## 2019-08-08 DIAGNOSIS — E119 Type 2 diabetes mellitus without complications: Secondary | ICD-10-CM | POA: Diagnosis not present

## 2019-08-08 DIAGNOSIS — E291 Testicular hypofunction: Secondary | ICD-10-CM | POA: Diagnosis not present

## 2019-08-08 DIAGNOSIS — K219 Gastro-esophageal reflux disease without esophagitis: Secondary | ICD-10-CM | POA: Diagnosis not present

## 2019-08-08 DIAGNOSIS — M4316 Spondylolisthesis, lumbar region: Secondary | ICD-10-CM | POA: Diagnosis not present

## 2019-08-08 DIAGNOSIS — M48061 Spinal stenosis, lumbar region without neurogenic claudication: Secondary | ICD-10-CM | POA: Diagnosis not present

## 2019-08-08 LAB — BAYER DCA HB A1C WAIVED: HB A1C (BAYER DCA - WAIVED): 5.9 % (ref <7.0)

## 2019-08-09 ENCOUNTER — Other Ambulatory Visit: Payer: Self-pay

## 2019-08-11 LAB — CBC WITH DIFFERENTIAL/PLATELET
Basophils Absolute: 0 10*3/uL (ref 0.0–0.2)
Basos: 0 %
EOS (ABSOLUTE): 0.1 10*3/uL (ref 0.0–0.4)
Eos: 1 %
Hematocrit: 49 % (ref 37.5–51.0)
Hemoglobin: 17 g/dL (ref 13.0–17.7)
Immature Grans (Abs): 0 10*3/uL (ref 0.0–0.1)
Immature Granulocytes: 0 %
Lymphocytes Absolute: 1.4 10*3/uL (ref 0.7–3.1)
Lymphs: 21 %
MCH: 31.3 pg (ref 26.6–33.0)
MCHC: 34.7 g/dL (ref 31.5–35.7)
MCV: 90 fL (ref 79–97)
Monocytes Absolute: 0.7 10*3/uL (ref 0.1–0.9)
Monocytes: 10 %
Neutrophils Absolute: 4.7 10*3/uL (ref 1.4–7.0)
Neutrophils: 68 %
Platelets: 226 10*3/uL (ref 150–450)
RBC: 5.43 x10E6/uL (ref 4.14–5.80)
RDW: 12 % (ref 11.6–15.4)
WBC: 6.9 10*3/uL (ref 3.4–10.8)

## 2019-08-11 LAB — CMP14+EGFR
ALT: 25 IU/L (ref 0–44)
AST: 20 IU/L (ref 0–40)
Albumin/Globulin Ratio: 2.3 — ABNORMAL HIGH (ref 1.2–2.2)
Albumin: 4.2 g/dL (ref 3.8–4.9)
Alkaline Phosphatase: 71 IU/L (ref 39–117)
BUN/Creatinine Ratio: 22 — ABNORMAL HIGH (ref 9–20)
BUN: 21 mg/dL (ref 6–24)
Bilirubin Total: 0.7 mg/dL (ref 0.0–1.2)
CO2: 28 mmol/L (ref 20–29)
Calcium: 9.2 mg/dL (ref 8.7–10.2)
Chloride: 97 mmol/L (ref 96–106)
Creatinine, Ser: 0.94 mg/dL (ref 0.76–1.27)
GFR calc Af Amer: 102 mL/min/{1.73_m2} (ref >59)
GFR calc non Af Amer: 88 mL/min/{1.73_m2} (ref >59)
Globulin, Total: 1.8 g/dL (ref 1.5–4.5)
Glucose: 82 mg/dL (ref 65–99)
Potassium: 4.5 mmol/L (ref 3.5–5.2)
Sodium: 139 mmol/L (ref 134–144)
Total Protein: 6 g/dL (ref 6.0–8.5)

## 2019-08-11 LAB — TESTOSTERONE,FREE AND TOTAL
Testosterone, Free: 18.9 pg/mL (ref 7.2–24.0)
Testosterone: 636 ng/dL (ref 264–916)

## 2019-08-12 ENCOUNTER — Ambulatory Visit (INDEPENDENT_AMBULATORY_CARE_PROVIDER_SITE_OTHER): Payer: Federal, State, Local not specified - PPO | Admitting: *Deleted

## 2019-08-12 DIAGNOSIS — E349 Endocrine disorder, unspecified: Secondary | ICD-10-CM | POA: Diagnosis not present

## 2019-08-12 DIAGNOSIS — E291 Testicular hypofunction: Secondary | ICD-10-CM

## 2019-08-13 ENCOUNTER — Other Ambulatory Visit: Payer: Self-pay

## 2019-08-13 ENCOUNTER — Ambulatory Visit
Admission: RE | Admit: 2019-08-13 | Discharge: 2019-08-13 | Disposition: A | Discharge status: Home / Self Care | Payer: Federal, State, Local not specified - PPO | Source: Ambulatory Visit | Attending: Orthopedic Surgery | Admitting: Orthopedic Surgery

## 2019-08-13 DIAGNOSIS — M48061 Spinal stenosis, lumbar region without neurogenic claudication: Secondary | ICD-10-CM | POA: Diagnosis not present

## 2019-08-13 DIAGNOSIS — M4726 Other spondylosis with radiculopathy, lumbar region: Secondary | ICD-10-CM

## 2019-08-20 DIAGNOSIS — M48062 Spinal stenosis, lumbar region with neurogenic claudication: Secondary | ICD-10-CM | POA: Diagnosis not present

## 2019-08-20 DIAGNOSIS — M5431 Sciatica, right side: Secondary | ICD-10-CM | POA: Diagnosis not present

## 2019-08-20 DIAGNOSIS — M4726 Other spondylosis with radiculopathy, lumbar region: Secondary | ICD-10-CM | POA: Diagnosis not present

## 2019-08-20 DIAGNOSIS — M961 Postlaminectomy syndrome, not elsewhere classified: Secondary | ICD-10-CM | POA: Diagnosis not present

## 2019-08-23 ENCOUNTER — Other Ambulatory Visit: Payer: Self-pay

## 2019-08-26 ENCOUNTER — Other Ambulatory Visit: Payer: Self-pay

## 2019-08-26 ENCOUNTER — Ambulatory Visit (INDEPENDENT_AMBULATORY_CARE_PROVIDER_SITE_OTHER): Payer: Federal, State, Local not specified - PPO

## 2019-08-26 DIAGNOSIS — E291 Testicular hypofunction: Secondary | ICD-10-CM | POA: Diagnosis not present

## 2019-08-26 DIAGNOSIS — E349 Endocrine disorder, unspecified: Secondary | ICD-10-CM | POA: Diagnosis not present

## 2019-08-26 NOTE — Progress Notes (Signed)
Testosterone injection given to right upper outer quadrant.  Patient tolerated well. 

## 2019-08-31 ENCOUNTER — Other Ambulatory Visit: Payer: Self-pay | Admitting: Family Medicine

## 2019-09-09 ENCOUNTER — Ambulatory Visit (INDEPENDENT_AMBULATORY_CARE_PROVIDER_SITE_OTHER): Payer: Federal, State, Local not specified - PPO | Admitting: *Deleted

## 2019-09-09 ENCOUNTER — Other Ambulatory Visit: Payer: Self-pay

## 2019-09-09 DIAGNOSIS — E349 Endocrine disorder, unspecified: Secondary | ICD-10-CM | POA: Diagnosis not present

## 2019-09-09 DIAGNOSIS — E291 Testicular hypofunction: Secondary | ICD-10-CM

## 2019-09-09 NOTE — Progress Notes (Signed)
Pt given testosterone injection 200mg  IM LUOQ and tolerated well 

## 2019-09-09 NOTE — Patient Instructions (Signed)
Testosterone injection What is this medicine? TESTOSTERONE (tes TOS ter one) is the main male hormone. It supports normal male development such as muscle growth, facial hair, and deep voice. It is used in males to treat low testosterone levels. This medicine may be used for other purposes; ask your health care provider or pharmacist if you have questions. COMMON BRAND NAME(S): Andro-L.A., Aveed, Delatestryl, Depo-Testosterone, Virilon What should I tell my health care provider before I take this medicine? They need to know if you have any of these conditions:  cancer  diabetes  heart disease  kidney disease  liver disease  lung disease  prostate disease  an unusual or allergic reaction to testosterone, other medicines, foods, dyes, or preservatives  pregnant or trying to get pregnant  breast-feeding How should I use this medicine? This medicine is for injection into a muscle. It is usually given by a health care professional in a hospital or clinic setting. Contact your pediatrician regarding the use of this medicine in children. While this medicine may be prescribed for children as young as 12 years of age for selected conditions, precautions do apply. Overdosage: If you think you have taken too much of this medicine contact a poison control center or emergency room at once. NOTE: This medicine is only for you. Do not share this medicine with others. What if I miss a dose? Try not to miss a dose. Your doctor or health care professional will tell you when your next injection is due. Notify the office if you are unable to keep an appointment. What may interact with this medicine?  medicines for diabetes  medicines that treat or prevent blood clots like warfarin  oxyphenbutazone  propranolol  steroid medicines like prednisone or cortisone This list may not describe all possible interactions. Give your health care provider a list of all the medicines, herbs, non-prescription  drugs, or dietary supplements you use. Also tell them if you smoke, drink alcohol, or use illegal drugs. Some items may interact with your medicine. What should I watch for while using this medicine? Visit your doctor or health care professional for regular checks on your progress. They will need to check the level of testosterone in your blood. This medicine is only approved for use in men who have low levels of testosterone related to certain medical conditions. Heart attacks and strokes have been reported with the use of this medicine. Notify your doctor or health care professional and seek emergency treatment if you develop breathing problems; changes in vision; confusion; chest pain or chest tightness; sudden arm pain; severe, sudden headache; trouble speaking or understanding; sudden numbness or weakness of the face, arm or leg; loss of balance or coordination. Talk to your doctor about the risks and benefits of this medicine. This medicine may affect blood sugar levels. If you have diabetes, check with your doctor or health care professional before you change your diet or the dose of your diabetic medicine. Testosterone injections are not commonly used in women. Women should inform their doctor if they wish to become pregnant or think they might be pregnant. There is a potential for serious side effects to an unborn child. Talk to your health care professional or pharmacist for more information. Talk with your doctor or health care professional about your birth control options while taking this medicine. This drug is banned from use in athletes by most athletic organizations. What side effects may I notice from receiving this medicine? Side effects that you should report to   your doctor or health care professional as soon as possible:  allergic reactions like skin rash, itching or hives, swelling of the face, lips, or tongue  breast enlargement  breathing problems  changes in emotions or  moods  deep or hoarse voice  irregular menstrual periods  signs and symptoms of liver injury like dark yellow or brown urine; general ill feeling or flu-like symptoms; light-colored stools; loss of appetite; nausea; right upper belly pain; unusually weak or tired; yellowing of the eyes or skin  stomach pain  swelling of the ankles, feet, hands  too frequent or persistent erections  trouble passing urine or change in the amount of urine Side effects that usually do not require medical attention (report to your doctor or health care professional if they continue or are bothersome):  acne  change in sex drive or performance  facial hair growth  hair loss  headache This list may not describe all possible side effects. Call your doctor for medical advice about side effects. You may report side effects to FDA at 1-800-FDA-1088. Where should I keep my medicine? Keep out of the reach of children. This medicine can be abused. Keep your medicine in a safe place to protect it from theft. Do not share this medicine with anyone. Selling or giving away this medicine is dangerous and against the law. Store at room temperature between 20 and 25 degrees C (68 and 77 degrees F). Do not freeze. Protect from light. Follow the directions for the product you are prescribed. Throw away any unused medicine after the expiration date. NOTE: This sheet is a summary. It may not cover all possible information. If you have questions about this medicine, talk to your doctor, pharmacist, or health care provider.  2020 Elsevier/Gold Standard (2015-07-18 07:33:55)  

## 2019-09-23 ENCOUNTER — Ambulatory Visit (INDEPENDENT_AMBULATORY_CARE_PROVIDER_SITE_OTHER): Payer: Federal, State, Local not specified - PPO

## 2019-09-23 ENCOUNTER — Other Ambulatory Visit: Payer: Self-pay

## 2019-09-23 DIAGNOSIS — E349 Endocrine disorder, unspecified: Secondary | ICD-10-CM

## 2019-09-23 DIAGNOSIS — E291 Testicular hypofunction: Secondary | ICD-10-CM

## 2019-09-23 NOTE — Progress Notes (Signed)
Testosterone injection given to right upper outer quadrant.  Patient tolerated well. 

## 2019-09-26 DIAGNOSIS — Z23 Encounter for immunization: Secondary | ICD-10-CM | POA: Diagnosis not present

## 2019-10-07 ENCOUNTER — Ambulatory Visit (INDEPENDENT_AMBULATORY_CARE_PROVIDER_SITE_OTHER): Payer: Federal, State, Local not specified - PPO

## 2019-10-07 ENCOUNTER — Other Ambulatory Visit: Payer: Self-pay

## 2019-10-07 DIAGNOSIS — E291 Testicular hypofunction: Secondary | ICD-10-CM

## 2019-10-07 DIAGNOSIS — E349 Endocrine disorder, unspecified: Secondary | ICD-10-CM | POA: Diagnosis not present

## 2019-10-07 NOTE — Progress Notes (Signed)
Testosterone injection given to left upper outer quadrant.  Patient tolerated well. 

## 2019-10-17 DIAGNOSIS — M961 Postlaminectomy syndrome, not elsewhere classified: Secondary | ICD-10-CM | POA: Diagnosis not present

## 2019-10-17 DIAGNOSIS — M5431 Sciatica, right side: Secondary | ICD-10-CM | POA: Diagnosis not present

## 2019-10-17 DIAGNOSIS — M4726 Other spondylosis with radiculopathy, lumbar region: Secondary | ICD-10-CM | POA: Diagnosis not present

## 2019-10-17 DIAGNOSIS — M48062 Spinal stenosis, lumbar region with neurogenic claudication: Secondary | ICD-10-CM | POA: Diagnosis not present

## 2019-10-18 DIAGNOSIS — Z23 Encounter for immunization: Secondary | ICD-10-CM | POA: Diagnosis not present

## 2019-10-21 ENCOUNTER — Other Ambulatory Visit: Payer: Self-pay

## 2019-10-21 ENCOUNTER — Ambulatory Visit (INDEPENDENT_AMBULATORY_CARE_PROVIDER_SITE_OTHER): Payer: Federal, State, Local not specified - PPO

## 2019-10-21 ENCOUNTER — Telehealth: Payer: Self-pay | Admitting: Family Medicine

## 2019-10-21 DIAGNOSIS — E291 Testicular hypofunction: Secondary | ICD-10-CM

## 2019-10-21 MED ORDER — TESTOSTERONE CYPIONATE 200 MG/ML IM SOLN: INTRAMUSCULAR | 2 refills | Status: DC

## 2019-10-21 NOTE — Progress Notes (Signed)
Testosterone injection given to right upper outer quadrant.  Patient tolerated well. 

## 2019-10-21 NOTE — Telephone Encounter (Signed)
Patient needs refill of his Testosterone.

## 2019-10-21 NOTE — Telephone Encounter (Signed)
Cesar Leon,  Patient is filing for disability.  Do you know anything about this form?

## 2019-10-22 NOTE — Telephone Encounter (Signed)
Pt aware ok to have shot before surgery. Wife will have paper refaxed to my fax number

## 2019-10-22 NOTE — Telephone Encounter (Signed)
Any form I had last week was completed Thursday of last week or before.  Yes he can have the testosterone shot. No impact at all on surgery.  WS

## 2019-11-01 DIAGNOSIS — M5431 Sciatica, right side: Secondary | ICD-10-CM | POA: Diagnosis not present

## 2019-11-01 DIAGNOSIS — Z01812 Encounter for preprocedural laboratory examination: Secondary | ICD-10-CM | POA: Diagnosis not present

## 2019-11-01 DIAGNOSIS — M4726 Other spondylosis with radiculopathy, lumbar region: Secondary | ICD-10-CM | POA: Diagnosis not present

## 2019-11-01 DIAGNOSIS — E291 Testicular hypofunction: Secondary | ICD-10-CM | POA: Diagnosis not present

## 2019-11-01 DIAGNOSIS — M4316 Spondylolisthesis, lumbar region: Secondary | ICD-10-CM | POA: Diagnosis not present

## 2019-11-01 DIAGNOSIS — M961 Postlaminectomy syndrome, not elsewhere classified: Secondary | ICD-10-CM | POA: Diagnosis not present

## 2019-11-01 DIAGNOSIS — M48062 Spinal stenosis, lumbar region with neurogenic claudication: Secondary | ICD-10-CM | POA: Diagnosis not present

## 2019-11-01 DIAGNOSIS — Z01810 Encounter for preprocedural cardiovascular examination: Secondary | ICD-10-CM | POA: Diagnosis not present

## 2019-11-04 ENCOUNTER — Ambulatory Visit (INDEPENDENT_AMBULATORY_CARE_PROVIDER_SITE_OTHER): Payer: Federal, State, Local not specified - PPO | Admitting: *Deleted

## 2019-11-04 ENCOUNTER — Other Ambulatory Visit: Payer: Self-pay

## 2019-11-04 DIAGNOSIS — E349 Endocrine disorder, unspecified: Secondary | ICD-10-CM | POA: Diagnosis not present

## 2019-11-04 NOTE — Progress Notes (Signed)
Testosterone Injection Given-Left  Tolerated well 2 week appt made

## 2019-11-06 DIAGNOSIS — M963 Postlaminectomy kyphosis: Secondary | ICD-10-CM | POA: Diagnosis not present

## 2019-11-06 DIAGNOSIS — M48062 Spinal stenosis, lumbar region with neurogenic claudication: Secondary | ICD-10-CM | POA: Diagnosis not present

## 2019-11-06 DIAGNOSIS — G4733 Obstructive sleep apnea (adult) (pediatric): Secondary | ICD-10-CM | POA: Diagnosis not present

## 2019-11-06 DIAGNOSIS — Z79899 Other long term (current) drug therapy: Secondary | ICD-10-CM | POA: Diagnosis not present

## 2019-11-06 DIAGNOSIS — M961 Postlaminectomy syndrome, not elsewhere classified: Secondary | ICD-10-CM | POA: Diagnosis not present

## 2019-11-06 DIAGNOSIS — M5431 Sciatica, right side: Secondary | ICD-10-CM | POA: Diagnosis not present

## 2019-11-06 DIAGNOSIS — M5432 Sciatica, left side: Secondary | ICD-10-CM | POA: Diagnosis not present

## 2019-11-06 DIAGNOSIS — M4726 Other spondylosis with radiculopathy, lumbar region: Secondary | ICD-10-CM | POA: Diagnosis not present

## 2019-11-06 DIAGNOSIS — Z981 Arthrodesis status: Secondary | ICD-10-CM | POA: Diagnosis not present

## 2019-11-06 DIAGNOSIS — M4326 Fusion of spine, lumbar region: Secondary | ICD-10-CM | POA: Diagnosis not present

## 2019-11-07 DIAGNOSIS — M961 Postlaminectomy syndrome, not elsewhere classified: Secondary | ICD-10-CM | POA: Diagnosis not present

## 2019-11-07 DIAGNOSIS — Z981 Arthrodesis status: Secondary | ICD-10-CM | POA: Diagnosis not present

## 2019-11-07 DIAGNOSIS — Z79899 Other long term (current) drug therapy: Secondary | ICD-10-CM | POA: Diagnosis not present

## 2019-11-07 DIAGNOSIS — M48062 Spinal stenosis, lumbar region with neurogenic claudication: Secondary | ICD-10-CM | POA: Diagnosis not present

## 2019-11-07 DIAGNOSIS — G4733 Obstructive sleep apnea (adult) (pediatric): Secondary | ICD-10-CM | POA: Diagnosis not present

## 2019-11-07 DIAGNOSIS — M4726 Other spondylosis with radiculopathy, lumbar region: Secondary | ICD-10-CM | POA: Diagnosis not present

## 2019-11-07 DIAGNOSIS — M5432 Sciatica, left side: Secondary | ICD-10-CM | POA: Diagnosis not present

## 2019-11-07 DIAGNOSIS — M545 Low back pain: Secondary | ICD-10-CM | POA: Diagnosis not present

## 2019-11-07 DIAGNOSIS — M5431 Sciatica, right side: Secondary | ICD-10-CM | POA: Diagnosis not present

## 2019-11-11 ENCOUNTER — Ambulatory Visit: Payer: Federal, State, Local not specified - PPO | Admitting: Family Medicine

## 2019-11-11 ENCOUNTER — Encounter: Payer: Self-pay | Admitting: Family Medicine

## 2019-11-11 ENCOUNTER — Other Ambulatory Visit: Payer: Self-pay

## 2019-11-11 VITALS — BP 121/81 | HR 98 | Temp 98.4°F | Ht 68.0 in | Wt 197.8 lb

## 2019-11-11 DIAGNOSIS — E119 Type 2 diabetes mellitus without complications: Secondary | ICD-10-CM | POA: Diagnosis not present

## 2019-11-11 DIAGNOSIS — E559 Vitamin D deficiency, unspecified: Secondary | ICD-10-CM | POA: Diagnosis not present

## 2019-11-11 DIAGNOSIS — E349 Endocrine disorder, unspecified: Secondary | ICD-10-CM | POA: Diagnosis not present

## 2019-11-11 DIAGNOSIS — E291 Testicular hypofunction: Secondary | ICD-10-CM

## 2019-11-11 DIAGNOSIS — E782 Mixed hyperlipidemia: Secondary | ICD-10-CM

## 2019-11-11 DIAGNOSIS — K219 Gastro-esophageal reflux disease without esophagitis: Secondary | ICD-10-CM | POA: Diagnosis not present

## 2019-11-11 LAB — BAYER DCA HB A1C WAIVED: HB A1C (BAYER DCA - WAIVED): 5.7 % (ref <7.0)

## 2019-11-11 MED ORDER — METHOCARBAMOL 500 MG PO TABS: 500.0000 mg | ORAL_TABLET | Freq: Four times a day (QID) | ORAL | 1 refills | Status: DC

## 2019-11-11 NOTE — Progress Notes (Signed)
Subjective:  Patient ID: Cesar Leon, male    DOB: [birth year]  Age: 59 y.o. MRN: [id number]  CC: Follow-up (3 month)   HPI Cesar Leon presents forFollow-up of diabetes. Patient checks blood sugar at home.  Patient denies symptoms such as polyuria, polydipsia, excessive hunger, nausea No significant hypoglycemic spells noted. Medications reviewed. Pt reports taking them regularly without complication/adverse reaction being reported today.   Pt. Had surgery for lumbar fusion 5 days ago. He needs a prescription for refill of his robaxin. His wife is here and gives some Cesar. She states he is having back oain, but the leg pain has resolved - almost immediately after surgery.    Also due for recheck of testosterone. Taking med as directed. No evidence for thrombosis Cesar Leon has a past medical Cesar of Allergic rhinitis, Chronic pain, DDD (degenerative disc disease) (12/28/08), Femur fracture, right ([place]), GERD (gastroesophageal reflux disease), Hematuria, Cesar of GI bleed, Hyperlipidemia, Hypogonadism male, Hypothyroidism, Internal hemorrhoid, and PVD (peripheral vascular disease) ([place]).   He has a past surgical Cesar that includes Carpal tunnel release (8/11) and right ankle  (1976).   His family Cesar includes Anxiety disorder in his father; Depression in his father; Diabetes in his brother and father; Lung cancer in his father; Peripheral vascular disease in his father.He reports that he quit smoking about 25 years ago. He has a 30.00 pack-year smoking Cesar. He has never used smokeless tobacco. He reports current alcohol use. He reports that he does not use drugs.  Current Outpatient Medications on File Prior to Visit  Medication Sig Dispense Refill  . atorvastatin (LIPITOR) 40 MG tablet TAKE ONE (1) TABLET EACH DAY 90 tablet 1  . Calcium Carb-Cholecalciferol (CALCIUM + D3 PO) Take by mouth.    . fexofenadine-pseudoephedrine (ALLEGRA-D 24) 180-240 MG 24 hr  tablet Take 1 tablet by mouth every evening. For allergy and congestion 30 tablet 11  . fluticasone (FLONASE) 50 MCG/ACT nasal spray Place 2 sprays into both nostrils daily. 16 g 5  . GAVILAX 17 GM/SCOOP powder MIX 17 GRAMS INTO 8OZ OF WATER AND DRINKDAILY 510 g 2  . Methylcellulose, Laxative, 500 MG TABS Take 2 tablets by mouth daily.    . montelukast (SINGULAIR) 10 MG tablet Take 1 tablet (10 mg total) by mouth at bedtime. 90 tablet 1  . Multiple Vitamins-Minerals (CENTRUM SILVER PO) Take 1 tablet by mouth daily.    Marland Kitchen omeprazole (PRILOSEC) 40 MG capsule TAKE ONE (1) CAPSULE EACH DAY 90 capsule 1  . oxyCODONE-acetaminophen (PERCOCET/ROXICET) 5-325 MG tablet Take by mouth.    . pregabalin (LYRICA) 150 MG capsule Take by mouth.    . testosterone cypionate (DEPOTESTOSTERONE CYPIONATE) 200 MG/ML injection INJECT 1ML EVERY 2 WEEKS 10 mL 2  . diclofenac (VOLTAREN) 75 MG EC tablet TAKE ONE TABLET BY MOUTH TWICE DAILY (Patient not taking: Reported on 11/11/2019) 60 tablet 1   Current Facility-Administered Medications on File Prior to Visit  Medication Dose Route Frequency Provider Last Rate Last Admin  . testosterone cypionate (DEPOTESTOSTERONE CYPIONATE) injection 200 mg  200 mg Intramuscular Q14 Days Claretta Fraise, MD   200 mg at 11/04/19 0906    ROS Review of Systems  Constitutional: Negative for fever.  Respiratory: Negative for shortness of breath.   Cardiovascular: Negative for chest pain.  Musculoskeletal: Negative for arthralgias.  Skin: Negative for rash.    Objective:  BP 121/81   Pulse 98   Temp 98.4 F (36.9 C) (Temporal)   Ht 5'  8" (1.727 m)   Wt 197 lb 12.8 oz (89.7 kg)   BMI 30.08 kg/m   BP Readings from Last 3 Encounters:  11/11/19 121/81  08/06/19 112/75  05/06/19 124/79    Wt Readings from Last 3 Encounters:  11/11/19 197 lb 12.8 oz (89.7 kg)  08/06/19 199 lb 6.4 oz (90.4 kg)  05/06/19 207 lb 3.2 oz (94 kg)     Physical Exam Vitals reviewed.    Constitutional:      General: He is not in acute distress.    Appearance: Normal appearance. He is well-developed. He is ill-appearing. He is not toxic-appearing.  HENT:     Head: Normocephalic and atraumatic.     Right Ear: Tympanic membrane and external ear normal. No decreased hearing noted.     Left Ear: Tympanic membrane and external ear normal. No decreased hearing noted.     Mouth/Throat:     Pharynx: No oropharyngeal exudate or posterior oropharyngeal erythema.  Eyes:     Pupils: Pupils are equal, round, and reactive to light.  Cardiovascular:     Rate and Rhythm: Normal rate and regular rhythm.     Heart sounds: No murmur.  Pulmonary:     Effort: No respiratory distress.     Breath sounds: Normal breath sounds.  Abdominal:     General: Bowel sounds are normal.     Palpations: Abdomen is soft. There is no mass.     Tenderness: There is no abdominal tenderness.  Musculoskeletal:        General: Deformity (midline lumbar surgical scar noted) present.     Cervical back: Normal range of motion and neck supple.  Neurological:     Mental Status: He is alert.       Assessment & Plan:   Cesar Leon was seen today for follow-up.  Diagnoses and all orders for this visit:  Diabetes mellitus without complication ([place]) -     CBC with Differential/Platelet -     CMP14+EGFR -     Bayer DCA Hb A1c Waived -     Microalbumin / creatinine urine ratio  Testosterone deficiency -     CBC with Differential/Platelet -     CMP14+EGFR -     Testosterone,Free and Total  Hypogonadism male -     CBC with Differential/Platelet -     CMP14+EGFR -     Testosterone,Free and Total  Gastroesophageal reflux disease without esophagitis -     CBC with Differential/Platelet -     CMP14+EGFR  Mixed hyperlipidemia -     CBC with Differential/Platelet -     CMP14+EGFR -     Lipid panel  Vitamin D deficiency -     CBC with Differential/Platelet -     CMP14+EGFR -     VITAMIN D 25 Hydroxy  (Vit-D Deficiency, Fractures)  Other orders -     methocarbamol (ROBAXIN) 500 MG tablet; Take 1 tablet (500 mg total) by mouth 4 (four) times daily.      I have discontinued Cesar Leon. Cesar Leon's HYDROcodone-acetaminophen. I have also changed his methocarbamol. Additionally, I am having him maintain his Calcium Carb-Cholecalciferol (CALCIUM + D3 PO), Methylcellulose (Laxative), Multiple Vitamins-Minerals (CENTRUM SILVER PO), atorvastatin, fexofenadine-pseudoephedrine, montelukast, fluticasone, omeprazole, GaviLAX, diclofenac, testosterone cypionate, oxyCODONE-acetaminophen, and pregabalin. We will continue to administer testosterone cypionate.  Meds ordered this encounter  Medications  . methocarbamol (ROBAXIN) 500 MG tablet    Sig: Take 1 tablet (500 mg total) by mouth 4 (four) times daily.  Dispense:  120 tablet    Refill:  1     Follow-up: Return in about 3 months (around 02/11/2020), or if symptoms worsen or fail to improve.  Claretta Fraise, M.D.

## 2019-11-12 LAB — MICROALBUMIN / CREATININE URINE RATIO
Creatinine, Urine: 143.6 mg/dL
Microalb/Creat Ratio: 5 mg/g creat (ref 0–29)
Microalbumin, Urine: 7.5 ug/mL

## 2019-11-12 NOTE — Progress Notes (Signed)
Hello Lemonte,  Your lab result is normal and/or stable.Some minor variations that are not significant are commonly marked abnormal, but do not represent any medical problem for you.  Best regards, Dusten Ellinwood, M.D.

## 2019-11-16 LAB — LIPID PANEL
Chol/HDL Ratio: 3.3 ratio (ref 0.0–5.0)
Cholesterol, Total: 104 mg/dL (ref 100–199)
HDL: 32 mg/dL — ABNORMAL LOW (ref >39)
LDL Chol Calc (NIH): 58 mg/dL (ref 0–99)
Triglycerides: 67 mg/dL (ref 0–149)
VLDL Cholesterol Cal: 14 mg/dL (ref 5–40)

## 2019-11-16 LAB — CBC WITH DIFFERENTIAL/PLATELET
Basophils Absolute: 0 10*3/uL (ref 0.0–0.2)
Basos: 1 %
EOS (ABSOLUTE): 0.1 10*3/uL (ref 0.0–0.4)
Eos: 2 %
Hematocrit: 39.4 % (ref 37.5–51.0)
Hemoglobin: 13.5 g/dL (ref 13.0–17.7)
Immature Grans (Abs): 0 10*3/uL (ref 0.0–0.1)
Immature Granulocytes: 0 %
Lymphocytes Absolute: 1.1 10*3/uL (ref 0.7–3.1)
Lymphs: 19 %
MCH: 30.5 pg (ref 26.6–33.0)
MCHC: 34.3 g/dL (ref 31.5–35.7)
MCV: 89 fL (ref 79–97)
Monocytes Absolute: 0.7 10*3/uL (ref 0.1–0.9)
Monocytes: 11 %
Neutrophils Absolute: 3.9 10*3/uL (ref 1.4–7.0)
Neutrophils: 67 %
Platelets: 298 10*3/uL (ref 150–450)
RBC: 4.42 x10E6/uL (ref 4.14–5.80)
RDW: 11.4 % — ABNORMAL LOW (ref 11.6–15.4)
WBC: 5.8 10*3/uL (ref 3.4–10.8)

## 2019-11-16 LAB — CMP14+EGFR
ALT: 16 IU/L (ref 0–44)
AST: 16 IU/L (ref 0–40)
Albumin/Globulin Ratio: 1.5 (ref 1.2–2.2)
Albumin: 3.4 g/dL — ABNORMAL LOW (ref 3.8–4.9)
Alkaline Phosphatase: 60 IU/L (ref 48–121)
BUN/Creatinine Ratio: 12 (ref 9–20)
BUN: 12 mg/dL (ref 6–24)
Bilirubin Total: 0.6 mg/dL (ref 0.0–1.2)
CO2: 26 mmol/L (ref 20–29)
Calcium: 8.9 mg/dL (ref 8.7–10.2)
Chloride: 98 mmol/L (ref 96–106)
Creatinine, Ser: 0.97 mg/dL (ref 0.76–1.27)
GFR calc Af Amer: 98 mL/min/{1.73_m2} (ref >59)
GFR calc non Af Amer: 85 mL/min/{1.73_m2} (ref >59)
Globulin, Total: 2.3 g/dL (ref 1.5–4.5)
Glucose: 107 mg/dL — ABNORMAL HIGH (ref 65–99)
Potassium: 4 mmol/L (ref 3.5–5.2)
Sodium: 140 mmol/L (ref 134–144)
Total Protein: 5.7 g/dL — ABNORMAL LOW (ref 6.0–8.5)

## 2019-11-16 LAB — TESTOSTERONE,FREE AND TOTAL
Testosterone, Free: 11 pg/mL (ref 7.2–24.0)
Testosterone: 667 ng/dL (ref 264–916)

## 2019-11-16 LAB — VITAMIN D 25 HYDROXY (VIT D DEFICIENCY, FRACTURES): Vit D, 25-Hydroxy: 48.4 ng/mL (ref 30.0–100.0)

## 2019-11-18 ENCOUNTER — Other Ambulatory Visit: Payer: Self-pay

## 2019-11-18 ENCOUNTER — Ambulatory Visit (INDEPENDENT_AMBULATORY_CARE_PROVIDER_SITE_OTHER): Payer: Federal, State, Local not specified - PPO | Admitting: *Deleted

## 2019-11-18 DIAGNOSIS — E349 Endocrine disorder, unspecified: Secondary | ICD-10-CM

## 2019-11-18 NOTE — Progress Notes (Signed)
Patient in today for testosterone injection. 200 mg given in right upper outer quadrant. Patient tolerated well.

## 2019-12-03 ENCOUNTER — Other Ambulatory Visit: Payer: Self-pay

## 2019-12-03 ENCOUNTER — Ambulatory Visit (INDEPENDENT_AMBULATORY_CARE_PROVIDER_SITE_OTHER): Payer: Federal, State, Local not specified - PPO | Admitting: *Deleted

## 2019-12-03 DIAGNOSIS — E349 Endocrine disorder, unspecified: Secondary | ICD-10-CM

## 2019-12-03 NOTE — Progress Notes (Signed)
Testosterone injection given and tolerated well.  

## 2019-12-17 ENCOUNTER — Ambulatory Visit (INDEPENDENT_AMBULATORY_CARE_PROVIDER_SITE_OTHER): Payer: Federal, State, Local not specified - PPO | Admitting: *Deleted

## 2019-12-17 ENCOUNTER — Other Ambulatory Visit: Payer: Self-pay

## 2019-12-17 DIAGNOSIS — E349 Endocrine disorder, unspecified: Secondary | ICD-10-CM

## 2019-12-17 NOTE — Progress Notes (Signed)
Pt given Testosterone Injection 200mg  IM RUOQ and tolerated well.

## 2019-12-19 ENCOUNTER — Other Ambulatory Visit: Payer: Self-pay | Admitting: Family Medicine

## 2019-12-31 ENCOUNTER — Other Ambulatory Visit: Payer: Self-pay

## 2019-12-31 ENCOUNTER — Ambulatory Visit (INDEPENDENT_AMBULATORY_CARE_PROVIDER_SITE_OTHER): Payer: Federal, State, Local not specified - PPO | Admitting: *Deleted

## 2019-12-31 DIAGNOSIS — E349 Endocrine disorder, unspecified: Secondary | ICD-10-CM

## 2019-12-31 NOTE — Progress Notes (Signed)
Testosterone injection given and tolerated well.  

## 2020-01-06 ENCOUNTER — Other Ambulatory Visit: Payer: Self-pay | Admitting: Family Medicine

## 2020-01-14 ENCOUNTER — Other Ambulatory Visit: Payer: Self-pay

## 2020-01-14 ENCOUNTER — Ambulatory Visit (INDEPENDENT_AMBULATORY_CARE_PROVIDER_SITE_OTHER): Payer: Federal, State, Local not specified - PPO | Admitting: *Deleted

## 2020-01-14 DIAGNOSIS — E349 Endocrine disorder, unspecified: Secondary | ICD-10-CM

## 2020-01-28 ENCOUNTER — Ambulatory Visit (INDEPENDENT_AMBULATORY_CARE_PROVIDER_SITE_OTHER): Payer: Federal, State, Local not specified - PPO | Admitting: *Deleted

## 2020-01-28 ENCOUNTER — Other Ambulatory Visit: Payer: Self-pay

## 2020-01-28 DIAGNOSIS — E349 Endocrine disorder, unspecified: Secondary | ICD-10-CM

## 2020-02-12 ENCOUNTER — Ambulatory Visit: Payer: Federal, State, Local not specified - PPO | Admitting: Family Medicine

## 2020-02-12 ENCOUNTER — Encounter: Payer: Self-pay | Admitting: Family Medicine

## 2020-02-12 ENCOUNTER — Other Ambulatory Visit: Payer: Self-pay

## 2020-02-12 VITALS — BP 124/89 | HR 92 | Temp 98.2°F | Resp 20 | Ht 68.0 in | Wt 189.1 lb

## 2020-02-12 DIAGNOSIS — K219 Gastro-esophageal reflux disease without esophagitis: Secondary | ICD-10-CM | POA: Diagnosis not present

## 2020-02-12 DIAGNOSIS — E782 Mixed hyperlipidemia: Secondary | ICD-10-CM

## 2020-02-12 DIAGNOSIS — E291 Testicular hypofunction: Secondary | ICD-10-CM | POA: Diagnosis not present

## 2020-02-12 DIAGNOSIS — M48061 Spinal stenosis, lumbar region without neurogenic claudication: Secondary | ICD-10-CM | POA: Diagnosis not present

## 2020-02-12 DIAGNOSIS — J301 Allergic rhinitis due to pollen: Secondary | ICD-10-CM

## 2020-02-12 DIAGNOSIS — E349 Endocrine disorder, unspecified: Secondary | ICD-10-CM

## 2020-02-12 MED ORDER — TESTOSTERONE CYPIONATE 200 MG/ML IM SOLN: INTRAMUSCULAR | 1 refills | Status: DC

## 2020-02-12 MED ORDER — OMEPRAZOLE 40 MG PO CPDR: DELAYED_RELEASE_CAPSULE | ORAL | 1 refills | Status: DC

## 2020-02-12 MED ORDER — ATORVASTATIN CALCIUM 40 MG PO TABS: ORAL_TABLET | ORAL | 1 refills | Status: DC

## 2020-02-12 MED ORDER — MONTELUKAST SODIUM 10 MG PO TABS: 10.0000 mg | ORAL_TABLET | Freq: Every day | ORAL | 1 refills | Status: DC

## 2020-02-12 MED ORDER — METHOCARBAMOL 500 MG PO TABS: 500.0000 mg | ORAL_TABLET | Freq: Four times a day (QID) | ORAL | 5 refills | Status: DC

## 2020-02-12 NOTE — Progress Notes (Signed)
Subjective:  Patient ID: Cesar Leon, male    DOB: 1960/10/30  Age: 59 y.o. MRN: [id number]  CC: Medical Management of Chronic Issues   HPI Cesar Leon presents for  in for follow-up of elevated cholesterol. Doing well without complaints on current medication. Denies side effects of statin including myalgia and arthralgia and nausea. Currently no chest pain, shortness of breath or other cardiovascular related symptoms noted.  Patient in for follow-up of GERD. Currently asymptomatic taking  PPI daily. There is no chest pain or heartburn. No hematemesis and no melena. No dysphagia or choking. Onset is remote. Progression is stable. Complicating factors, none.  Pt. Taking testosterone shots biweekly. He had one this morning on arrival. Due for testing. Denies energy problems, muscle tone issues, etc. No swelling to indicate blood clotting.  Depression screen [facility] 2/9 02/12/2020 11/11/2019 08/06/2019  Decreased Interest 0 0 0  Down, Depressed, Hopeless 0 0 0  PHQ - 2 Score 0 0 0  Some recent data might be hidden    History Cesar Leon has a past medical history of Allergic rhinitis, Chronic pain, DDD (degenerative disc disease) (12/28/08), Femur fracture, right ([facility]), GERD (gastroesophageal reflux disease), Hematuria, History of GI bleed, Hyperlipidemia, Hypogonadism male, Hypothyroidism, Internal hemorrhoid, Opioid type dependence, continuous ([facility]) (09/28/2015), and PVD (peripheral vascular disease) ([facility]).   He has a past surgical history that includes Carpal tunnel release (8/11) and right ankle  (1976).   His family history includes Anxiety disorder in his father; Depression in his father; Diabetes in his brother and father; Lung cancer in his father; Peripheral vascular disease in his father.He reports that he quit smoking about 25 years ago. He has a 30.00 pack-year smoking history. He has never used smokeless tobacco. He reports current alcohol use. He reports that he does not use  drugs.    ROS Review of Systems  Constitutional: Negative for fever.  Respiratory: Negative for shortness of breath.   Cardiovascular: Negative for chest pain.  Musculoskeletal: Negative for arthralgias.  Skin: Negative for rash.    Objective:  BP 124/89    Pulse 92    Temp 98.2 F (36.8 C) (Temporal)    Resp 20    Ht 5\' 8"  (1.727 m)    Wt 189 lb 2 oz (85.8 kg)    SpO2 100%    BMI 28.76 kg/m   BP Readings from Last 3 Encounters:  02/12/20 124/89  11/11/19 121/81  08/06/19 112/75    Wt Readings from Last 3 Encounters:  02/12/20 189 lb 2 oz (85.8 kg)  11/11/19 197 lb 12.8 oz (89.7 kg)  08/06/19 199 lb 6.4 oz (90.4 kg)     Physical Exam Vitals reviewed.  Constitutional:      Appearance: He is well-developed.  HENT:     Head: Normocephalic and atraumatic.     Right Ear: Tympanic membrane and external ear normal. No decreased hearing noted.     Left Ear: Tympanic membrane and external ear normal. No decreased hearing noted.     Mouth/Throat:     Pharynx: No oropharyngeal exudate or posterior oropharyngeal erythema.  Eyes:     Pupils: Pupils are equal, round, and reactive to light.  Cardiovascular:     Rate and Rhythm: Normal rate and regular rhythm.     Heart sounds: No murmur heard.   Pulmonary:     Effort: No respiratory distress.     Breath sounds: Normal breath sounds.  Abdominal:     General: Bowel sounds  are normal.     Palpations: Abdomen is soft. There is no mass.     Tenderness: There is no abdominal tenderness.  Musculoskeletal:     Cervical back: Normal range of motion and neck supple.       Assessment & Plan:   Cesar Leon was seen today for medical management of chronic issues.  Diagnoses and all orders for this visit:  Hypogonadism male  Mixed hyperlipidemia  Spinal stenosis of lumbar region, unspecified whether neurogenic claudication present  Gastroesophageal reflux disease without esophagitis -     omeprazole (PRILOSEC) 40 MG capsule;  TAKE ONE (1) CAPSULE EACH DAY  Seasonal allergic rhinitis due to pollen -     montelukast (SINGULAIR) 10 MG tablet; Take 1 tablet (10 mg total) by mouth at bedtime.  Other orders -     atorvastatin (LIPITOR) 40 MG tablet; TAKE ONE (1) TABLET EACH DAY -     methocarbamol (ROBAXIN) 500 MG tablet; Take 1 tablet (500 mg total) by mouth 4 (four) times daily. -     testosterone cypionate (DEPOTESTOSTERONE CYPIONATE) 200 MG/ML injection; INJECT EVERY 2 WEEKS       I have discontinued Cesar Leon. Cesar Leon's Methylcellulose (Laxative), diclofenac, and pregabalin. I am also having him maintain his Calcium Carb-Cholecalciferol (CALCIUM + D3 PO), Multiple Vitamins-Minerals (CENTRUM SILVER PO), fexofenadine-pseudoephedrine, fluticasone, GaviLAX, atorvastatin, methocarbamol, montelukast, omeprazole, and testosterone cypionate. We administered testosterone cypionate. We will continue to administer testosterone cypionate.  Allergies as of 02/12/2020      Reactions   Metformin And Related Nausea And Vomiting   Codeine    Penicillins       Medication List       Accurate as of February 12, 2020  9:10 PM. If you have any questions, ask your nurse or doctor.        STOP taking these medications   diclofenac 75 MG EC tablet Commonly known as: VOLTAREN Stopped by: Mechele Claude, MD   Methylcellulose (Laxative) 500 MG Tabs Stopped by: Mechele Claude, MD   pregabalin 150 MG capsule Commonly known as: LYRICA Stopped by: Mechele Claude, MD     TAKE these medications   atorvastatin 40 MG tablet Commonly known as: LIPITOR TAKE ONE (1) TABLET EACH DAY   CALCIUM + D3 PO Take by mouth.   CENTRUM SILVER PO Take 1 tablet by mouth daily.   fexofenadine-pseudoephedrine 180-240 MG 24 hr tablet Commonly known as: ALLEGRA-D 24 Take 1 tablet by mouth every evening. For allergy and congestion   fluticasone 50 MCG/ACT nasal spray Commonly known as: FLONASE Place 2 sprays into both nostrils daily.    GaviLAX 17 GM/SCOOP powder Generic drug: polyethylene glycol powder MIX 17 GRAMS INTO 8OZ OF WATER AND DRINK DAILY   methocarbamol 500 MG tablet Commonly known as: ROBAXIN Take 1 tablet (500 mg total) by mouth 4 (four) times daily.   montelukast 10 MG tablet Commonly known as: SINGULAIR Take 1 tablet (10 mg total) by mouth at bedtime.   omeprazole 40 MG capsule Commonly known as: PRILOSEC TAKE ONE (1) CAPSULE EACH DAY   testosterone cypionate 200 MG/ML injection Commonly known as: DEPOTESTOSTERONE CYPIONATE INJECT EVERY 2 WEEKS        Follow-up: No follow-ups on file.  Mechele Claude, M.D.

## 2020-02-13 ENCOUNTER — Other Ambulatory Visit: Payer: Federal, State, Local not specified - PPO

## 2020-02-13 ENCOUNTER — Other Ambulatory Visit: Payer: Self-pay

## 2020-02-13 DIAGNOSIS — E291 Testicular hypofunction: Secondary | ICD-10-CM

## 2020-02-13 DIAGNOSIS — E119 Type 2 diabetes mellitus without complications: Secondary | ICD-10-CM

## 2020-02-13 DIAGNOSIS — E782 Mixed hyperlipidemia: Secondary | ICD-10-CM

## 2020-02-13 LAB — BAYER DCA HB A1C WAIVED: HB A1C (BAYER DCA - WAIVED): 5.7 % (ref <7.0)

## 2020-02-14 LAB — CBC WITH DIFFERENTIAL/PLATELET
Basophils Absolute: 0 10*3/uL (ref 0.0–0.2)
Basos: 1 %
EOS (ABSOLUTE): 0.1 10*3/uL (ref 0.0–0.4)
Eos: 2 %
Hematocrit: 46.2 % (ref 37.5–51.0)
Hemoglobin: 14.9 g/dL (ref 13.0–17.7)
Immature Grans (Abs): 0 10*3/uL (ref 0.0–0.1)
Immature Granulocytes: 0 %
Lymphocytes Absolute: 1.2 10*3/uL (ref 0.7–3.1)
Lymphs: 27 %
MCH: 27 pg (ref 26.6–33.0)
MCHC: 32.3 g/dL (ref 31.5–35.7)
MCV: 84 fL (ref 79–97)
Monocytes Absolute: 0.5 10*3/uL (ref 0.1–0.9)
Monocytes: 10 %
Neutrophils Absolute: 2.6 10*3/uL (ref 1.4–7.0)
Neutrophils: 60 %
Platelets: 263 10*3/uL (ref 150–450)
RBC: 5.52 x10E6/uL (ref 4.14–5.80)
RDW: 12.8 % (ref 11.6–15.4)
WBC: 4.3 10*3/uL (ref 3.4–10.8)

## 2020-02-14 LAB — CMP14+EGFR
ALT: 18 IU/L (ref 0–44)
AST: 16 IU/L (ref 0–40)
Albumin/Globulin Ratio: 2 (ref 1.2–2.2)
Albumin: 4.1 g/dL (ref 3.8–4.9)
Alkaline Phosphatase: 103 IU/L (ref 48–121)
BUN/Creatinine Ratio: 9 — ABNORMAL LOW (ref 10–24)
BUN: 9 mg/dL (ref 8–27)
Bilirubin Total: 0.6 mg/dL (ref 0.0–1.2)
CO2: 27 mmol/L (ref 20–29)
Calcium: 8.9 mg/dL (ref 8.6–10.2)
Chloride: 99 mmol/L (ref 96–106)
Creatinine, Ser: 0.96 mg/dL (ref 0.76–1.27)
GFR calc Af Amer: 99 mL/min/{1.73_m2} (ref >59)
GFR calc non Af Amer: 86 mL/min/{1.73_m2} (ref >59)
Globulin, Total: 2.1 g/dL (ref 1.5–4.5)
Glucose: 123 mg/dL — ABNORMAL HIGH (ref 65–99)
Potassium: 4.2 mmol/L (ref 3.5–5.2)
Sodium: 137 mmol/L (ref 134–144)
Total Protein: 6.2 g/dL (ref 6.0–8.5)

## 2020-02-14 LAB — LIPID PANEL
Chol/HDL Ratio: 2.7 ratio (ref 0.0–5.0)
Cholesterol, Total: 108 mg/dL (ref 100–199)
HDL: 40 mg/dL (ref >39)
LDL Chol Calc (NIH): 54 mg/dL (ref 0–99)
Triglycerides: 62 mg/dL (ref 0–149)
VLDL Cholesterol Cal: 14 mg/dL (ref 5–40)

## 2020-02-15 NOTE — Progress Notes (Signed)
Hello Cesar Leon,  Your lab result is normal and/or stable.Some minor variations that are not significant are commonly marked abnormal, but do not represent any medical problem for you.  Best regards, Artrell Lawless, M.D.

## 2020-02-17 LAB — TESTOSTERONE,FREE AND TOTAL
Testosterone, Free: 21.5 pg/mL — ABNORMAL HIGH (ref 6.6–18.1)
Testosterone: 1000 ng/dL — ABNORMAL HIGH (ref 264–916)

## 2020-02-18 NOTE — Progress Notes (Signed)
Hello Cesar Leon,  Your lab result is normal and/or stable.Some minor variations that are not significant are commonly marked abnormal, but do not represent any medical problem for you.  Best regards, Zebedee Segundo, M.D.

## 2020-02-26 ENCOUNTER — Ambulatory Visit (INDEPENDENT_AMBULATORY_CARE_PROVIDER_SITE_OTHER): Payer: Federal, State, Local not specified - PPO | Admitting: *Deleted

## 2020-02-26 ENCOUNTER — Other Ambulatory Visit: Payer: Self-pay

## 2020-02-26 ENCOUNTER — Telehealth: Payer: Self-pay | Admitting: Family Medicine

## 2020-02-26 DIAGNOSIS — E349 Endocrine disorder, unspecified: Secondary | ICD-10-CM

## 2020-02-26 NOTE — Telephone Encounter (Signed)
FYI: Pt wanted to let Dr Darlyn Read know that since being off of the Montelukast and Omeprazole, he has been doing good. No issues.

## 2020-02-26 NOTE — Progress Notes (Signed)
Testosterone Inj given and tolerated well. Injections scheduled through September

## 2020-03-11 ENCOUNTER — Other Ambulatory Visit: Payer: Self-pay

## 2020-03-11 ENCOUNTER — Ambulatory Visit (INDEPENDENT_AMBULATORY_CARE_PROVIDER_SITE_OTHER): Payer: Federal, State, Local not specified - PPO | Admitting: *Deleted

## 2020-03-11 DIAGNOSIS — E349 Endocrine disorder, unspecified: Secondary | ICD-10-CM | POA: Diagnosis not present

## 2020-03-11 NOTE — Progress Notes (Signed)
Pt tolerated testosterone inj well 

## 2020-03-25 ENCOUNTER — Ambulatory Visit (INDEPENDENT_AMBULATORY_CARE_PROVIDER_SITE_OTHER): Payer: Federal, State, Local not specified - PPO | Admitting: *Deleted

## 2020-03-25 ENCOUNTER — Other Ambulatory Visit: Payer: Self-pay

## 2020-03-25 DIAGNOSIS — E349 Endocrine disorder, unspecified: Secondary | ICD-10-CM

## 2020-03-25 DIAGNOSIS — Z23 Encounter for immunization: Secondary | ICD-10-CM

## 2020-03-25 NOTE — Progress Notes (Signed)
Testosterone injection given and tolerated well.  

## 2020-04-08 ENCOUNTER — Ambulatory Visit (INDEPENDENT_AMBULATORY_CARE_PROVIDER_SITE_OTHER): Payer: Federal, State, Local not specified - PPO | Admitting: *Deleted

## 2020-04-08 DIAGNOSIS — E349 Endocrine disorder, unspecified: Secondary | ICD-10-CM

## 2020-04-08 NOTE — Progress Notes (Signed)
Testosterone injection given and patient tolerated well.  

## 2020-04-22 ENCOUNTER — Other Ambulatory Visit: Payer: Self-pay

## 2020-04-22 ENCOUNTER — Ambulatory Visit (INDEPENDENT_AMBULATORY_CARE_PROVIDER_SITE_OTHER): Payer: Federal, State, Local not specified - PPO

## 2020-04-22 DIAGNOSIS — E349 Endocrine disorder, unspecified: Secondary | ICD-10-CM

## 2020-04-22 NOTE — Progress Notes (Signed)
Testosterone injection given to left upper outer quadrant.  Patient tolerated well. 

## 2020-05-06 ENCOUNTER — Other Ambulatory Visit: Payer: Self-pay

## 2020-05-06 ENCOUNTER — Ambulatory Visit (INDEPENDENT_AMBULATORY_CARE_PROVIDER_SITE_OTHER): Payer: Federal, State, Local not specified - PPO

## 2020-05-06 DIAGNOSIS — E349 Endocrine disorder, unspecified: Secondary | ICD-10-CM

## 2020-05-06 NOTE — Progress Notes (Signed)
Testosterone injection given to right upper outer quadrant.  Patient tolerated well. 

## 2020-05-20 ENCOUNTER — Ambulatory Visit (INDEPENDENT_AMBULATORY_CARE_PROVIDER_SITE_OTHER): Payer: Federal, State, Local not specified - PPO

## 2020-05-20 ENCOUNTER — Other Ambulatory Visit: Payer: Self-pay

## 2020-05-20 DIAGNOSIS — E349 Endocrine disorder, unspecified: Secondary | ICD-10-CM | POA: Diagnosis not present

## 2020-06-03 ENCOUNTER — Other Ambulatory Visit: Payer: Self-pay

## 2020-06-03 ENCOUNTER — Ambulatory Visit (INDEPENDENT_AMBULATORY_CARE_PROVIDER_SITE_OTHER): Payer: Federal, State, Local not specified - PPO | Admitting: *Deleted

## 2020-06-03 DIAGNOSIS — E349 Endocrine disorder, unspecified: Secondary | ICD-10-CM

## 2020-06-03 NOTE — Progress Notes (Signed)
Testosterone Injection given and tolerated well  

## 2020-06-15 ENCOUNTER — Other Ambulatory Visit: Payer: Self-pay | Admitting: Family Medicine

## 2020-06-17 ENCOUNTER — Other Ambulatory Visit: Payer: Self-pay

## 2020-06-17 ENCOUNTER — Ambulatory Visit (INDEPENDENT_AMBULATORY_CARE_PROVIDER_SITE_OTHER): Payer: Federal, State, Local not specified - PPO

## 2020-06-17 DIAGNOSIS — E349 Endocrine disorder, unspecified: Secondary | ICD-10-CM

## 2020-06-17 NOTE — Progress Notes (Signed)
Testosterone IM injection give today in left upper outer quad without difficulty. Pt has no concerns today and is scheduled in 1 week for the next injection.

## 2020-07-01 ENCOUNTER — Other Ambulatory Visit: Payer: Self-pay

## 2020-07-01 ENCOUNTER — Ambulatory Visit (INDEPENDENT_AMBULATORY_CARE_PROVIDER_SITE_OTHER): Payer: Federal, State, Local not specified - PPO

## 2020-07-01 DIAGNOSIS — E349 Endocrine disorder, unspecified: Secondary | ICD-10-CM | POA: Diagnosis not present

## 2020-07-15 ENCOUNTER — Ambulatory Visit (INDEPENDENT_AMBULATORY_CARE_PROVIDER_SITE_OTHER): Payer: Federal, State, Local not specified - PPO | Admitting: *Deleted

## 2020-07-15 ENCOUNTER — Ambulatory Visit: Payer: Federal, State, Local not specified - PPO

## 2020-07-15 ENCOUNTER — Other Ambulatory Visit: Payer: Self-pay

## 2020-07-15 DIAGNOSIS — E349 Endocrine disorder, unspecified: Secondary | ICD-10-CM

## 2020-07-15 NOTE — Progress Notes (Signed)
Testosterone Injection given and tolerated well  

## 2020-07-29 ENCOUNTER — Ambulatory Visit (INDEPENDENT_AMBULATORY_CARE_PROVIDER_SITE_OTHER): Payer: Federal, State, Local not specified - PPO

## 2020-07-29 ENCOUNTER — Other Ambulatory Visit: Payer: Self-pay

## 2020-07-29 DIAGNOSIS — E349 Endocrine disorder, unspecified: Secondary | ICD-10-CM

## 2020-07-29 NOTE — Progress Notes (Signed)
Testosterone injection given to right upper outer quadrant.  Patient tolerated well. 

## 2020-08-12 ENCOUNTER — Ambulatory Visit (INDEPENDENT_AMBULATORY_CARE_PROVIDER_SITE_OTHER): Payer: Federal, State, Local not specified - PPO | Admitting: Family Medicine

## 2020-08-12 ENCOUNTER — Other Ambulatory Visit: Payer: Self-pay

## 2020-08-12 DIAGNOSIS — E349 Endocrine disorder, unspecified: Secondary | ICD-10-CM

## 2020-08-13 ENCOUNTER — Ambulatory Visit: Payer: Federal, State, Local not specified - PPO | Admitting: Family Medicine

## 2020-08-13 ENCOUNTER — Encounter: Payer: Self-pay | Admitting: Family Medicine

## 2020-08-13 VITALS — BP 129/73 | HR 84 | Temp 98.4°F | Resp 20 | Ht 68.0 in | Wt 175.1 lb

## 2020-08-13 DIAGNOSIS — E782 Mixed hyperlipidemia: Secondary | ICD-10-CM

## 2020-08-13 DIAGNOSIS — M48061 Spinal stenosis, lumbar region without neurogenic claudication: Secondary | ICD-10-CM

## 2020-08-13 DIAGNOSIS — E291 Testicular hypofunction: Secondary | ICD-10-CM

## 2020-08-13 DIAGNOSIS — M4316 Spondylolisthesis, lumbar region: Secondary | ICD-10-CM

## 2020-08-13 NOTE — Progress Notes (Signed)
Subjective:  Patient ID: Cesar Leon, male    DOB: 11/29/1960  Age: 60 y.o. MRN: [id number]  CC: Medical Management of Chronic Issues   HPI Cesar Leon presents for follow-up of elevated cholesterol. Doing well without complaints on current medication. Denies side effects of statin including myalgia and arthralgia and nausea. Also in today for liver function testing. Currently no chest pain, shortness of breath or other cardiovascular related symptoms noted.  Recent back surgery has rendered him pain free, but he cannot bend. Having to file for disability.   Follow up for testosterone deficiency: Pt. Using medication as directed. Denies any sx referrable to DVT such as edema or erythema of legs. No dyspnea or chest pain. Energy level reported as being good. Libido is normal and denies E.D. Feels strength is adequate and improved from baseline.  History Cesar Leon has a past medical history of Allergic rhinitis, Chronic pain, DDD (degenerative disc disease) (12/28/08), Femur fracture, right ([place]), GERD (gastroesophageal reflux disease), Hematuria, History of GI bleed, Hyperlipidemia, Hypogonadism male, Hypothyroidism, Internal hemorrhoid, Opioid type dependence, continuous ([place]) (09/28/2015), and PVD (peripheral vascular disease) ([place]).   He has a past surgical history that includes Carpal tunnel release (8/11) and right ankle  (1976).   His family history includes Anxiety disorder in his father; Depression in his father; Diabetes in his brother and father; Lung cancer in his father; Peripheral vascular disease in his father.He reports that he quit smoking about 26 years ago. He has a 30.00 pack-year smoking history. He has never used smokeless tobacco. He reports current alcohol use. He reports that he does not use drugs.  Current Outpatient Medications on File Prior to Visit  Medication Sig Dispense Refill  . atorvastatin (LIPITOR) 40 MG tablet TAKE ONE (1) TABLET EACH DAY 90 tablet 1   . Calcium Carb-Cholecalciferol (CALCIUM + D3 PO) Take by mouth.    . fexofenadine-pseudoephedrine (ALLEGRA-D 24) 180-240 MG 24 hr tablet Take 1 tablet by mouth every evening. For allergy and congestion 30 tablet 11  . fluticasone (FLONASE) 50 MCG/ACT nasal spray Place 2 sprays into both nostrils daily. 16 g 5  . methocarbamol (ROBAXIN) 500 MG tablet Take 1 tablet (500 mg total) by mouth 4 (four) times daily. (Patient taking differently: Take 500 mg by mouth every 6 (six) hours as needed.) 120 tablet 5  . Multiple Vitamins-Minerals (CENTRUM SILVER PO) Take 1 tablet by mouth daily.    . polyethylene glycol powder (GLYCOLAX/MIRALAX) 17 GM/SCOOP powder MIX 17 GRAMS INTO 8OZ OF WATER AND DRINK DAILY 510 g 2  . montelukast (SINGULAIR) 10 MG tablet Take 1 tablet (10 mg total) by mouth at bedtime. (Patient not taking: Reported on 08/13/2020) 90 tablet 1  . omeprazole (PRILOSEC) 40 MG capsule TAKE ONE (1) CAPSULE EACH DAY (Patient not taking: Reported on 08/13/2020) 90 capsule 1   Current Facility-Administered Medications on File Prior to Visit  Medication Dose Route Frequency Provider Last Rate Last Admin  . testosterone cypionate (DEPOTESTOSTERONE CYPIONATE) injection 200 mg  200 mg Intramuscular Q14 Days Claretta Fraise, MD   200 mg at 08/12/20 0805    ROS Review of Systems  Constitutional: Negative for fever.  Respiratory: Negative for shortness of breath.   Cardiovascular: Negative for chest pain.  Musculoskeletal: Positive for back pain. Negative for arthralgias.  Skin: Negative for rash.    Objective:  BP 129/73   Pulse 84   Temp 98.4 F (36.9 C) (Temporal)   Resp 20   Ht _0  (  1.727 m)   Wt 175 lb 2 oz (79.4 kg)   SpO2 96%   BMI 26.63 kg/m   BP Readings from Last 3 Encounters:  08/13/20 129/73  02/12/20 124/89  11/11/19 121/81    Wt Readings from Last 3 Encounters:  08/13/20 175 lb 2 oz (79.4 kg)  02/12/20 189 lb 2 oz (85.8 kg)  11/11/19 197 lb 12.8 oz (89.7 kg)      Physical Exam Vitals reviewed.  Constitutional:      Appearance: He is well-developed and well-nourished.  HENT:     Head: Normocephalic and atraumatic.     Right Ear: Tympanic membrane and external ear normal. No decreased hearing noted.     Left Ear: Tympanic membrane and external ear normal. No decreased hearing noted.     Mouth/Throat:     Pharynx: No oropharyngeal exudate or posterior oropharyngeal erythema.  Eyes:     Pupils: Pupils are equal, round, and reactive to light.  Cardiovascular:     Rate and Rhythm: Normal rate and regular rhythm.     Heart sounds: No murmur heard.   Pulmonary:     Effort: No respiratory distress.     Breath sounds: Normal breath sounds.  Abdominal:     General: Bowel sounds are normal.     Palpations: Abdomen is soft. There is no mass.     Tenderness: There is no abdominal tenderness.  Musculoskeletal:        General: No swelling or deformity.     Cervical back: Normal range of motion and neck supple.     Lab Results  Component Value Date   HGBA1C 5.7 02/13/2020   HGBA1C 5.7 11/11/2019   HGBA1C 5.9 08/08/2019    Lab Results  Component Value Date   WBC 4.5 08/13/2020   HGB 15.1 08/13/2020   HCT 43.7 08/13/2020   PLT 238 08/13/2020   GLUCOSE 83 08/13/2020   CHOL 126 08/13/2020   TRIG 40 08/13/2020   HDL 60 08/13/2020   LDLCALC 56 08/13/2020   ALT 24 08/13/2020   AST 35 08/13/2020   NA 134 08/13/2020   K 3.7 08/13/2020   CL 95 (L) 08/13/2020   CREATININE 1.02 08/13/2020   BUN 14 08/13/2020   CO2 25 08/13/2020   TSH 2.110 01/17/2014   PSA 0.4 09/22/2014   HGBA1C 5.7 02/13/2020    MR LUMBAR SPINE WO CONTRAST  Result Date: 08/13/2019 CLINICAL DATA:  Other spondylosis with radiculopathy, lumbar region. Additional history provided by technologist: Patient reports low back pain, history of lower back surgery 2019. EXAM: MRI LUMBAR SPINE WITHOUT CONTRAST TECHNIQUE: Multiplanar, multisequence MR imaging of the lumbar spine  was performed. No intravenous contrast was administered. COMPARISON:  Lumbar spine radiographs 03/21/2019, lumbar spine MRI 03/22/2018 FINDINGS: Segmentation: For the purposes of this dictation, five lumbar vertebrae are assumed and the caudal most well-formed intervertebral disc is designated L5-S1. Alignment: Straightening of the expected lumbar lordosis. Trace L3-L4 retrolisthesis. Trace L4-L5 anterolisthesis. Post laminectomy changes with interbody spacer and posterior spinal fusion construct at L4-L5. These postsurgical changes are new as compared to prior examination 09/19/2017. No marrow edema or suspicious osseous lesion identified, although susceptibility artifact from spinal fusion hardware slightly limits evaluation at these levels. Multilevel degenerative endplate irregularity with small Schmorl nodes. Vertebrae:  Vertebral body height is maintained. Conus medullaris and cauda equina: Conus extends to the T12 level. No signal abnormality within the visualized distal spinal cord. Paraspinal and other soft tissues: No abnormality identified within included  portions of the abdomen/retroperitoneum. Postsurgical changes to the lower lumbar dorsal paraspinal soft tissues. Atrophy of the lower lumbar paraspinal musculature. Disc levels: Unless otherwise stated, the level by level findings below have not significantly changed since prior MRI 09/19/2017. Levels of mild and moderate disc degeneration greatest at T11-T12, L2-L3 and L5-S1. L2-L3 disc degeneration has progressed. T11-T12: This level is not included on axial imaging. Small disc bulge. No significant spinal canal or neural foraminal narrowing. T12-L1: Minimal disc bulge. No significant spinal canal or neural foraminal narrowing. L1-L2: Small disc bulge with mild endplate spurring. Mild facet arthrosis/ligamentum flavum hypertrophy. Mild spinal canal and bilateral neural foraminal narrowing. L2-L3: Disc bulge. Moderate/severe facet arthrosis/ligamentum  flavum hypertrophy. Moderate/severe spinal canal stenosis has progressed. Moderate bilateral neural foraminal narrowing, progressed on the right as compared to prior examination. L3-L4: Trace retrolisthesis. Disc bulge. There is a new shallow broad-based right subarticular/foraminal disc protrusion. Moderate facet arthrosis/ligamentum flavum hypertrophy. Mild bilateral subarticular and central canal narrowing. Moderate/severe right neural foraminal narrowing has progressed. L4-L5: Post-laminectomy changes are new as compared to prior MRI. Spinal canal widely patent. Facet hypertrophy. No significant neural foraminal narrowing. L5-S1: Small disc bulge with circumferential osteophyte ridge. Mild facet arthrosis. No significant spinal canal stenosis or neural foraminal narrowing. IMPRESSION: At L4-L5, post-laminectomy changes with interbody spacer and posterior spinal fusion construct are new as compared to prior MRI 09/19/2017. The spinal canal is now widely patent at this level. No foraminal stenosis. Lumbar spondylosis has progressed at several levels since this prior study, most notably as follows. At L2-L3, multifactorial moderate/severe spinal canal stenosis has progressed. Moderate bilateral neural foraminal narrowing, progressed on the right. At L3-L4, there is a new shallow right subarticular/foraminal disc protrusion. This contributes to moderate/severe right neural foraminal narrowing, progressed. Unchanged mild spinal canal narrowing at this level. Electronically Signed   By: Kellie Simmering DO   On: 08/13/2019 09:33    Assessment & Plan:   Sharrod was seen today for medical management of chronic issues.  Diagnoses and all orders for this visit:  Mixed hyperlipidemia -     CBC with Differential/Platelet -     CMP14+EGFR -     Lipid panel -     Testosterone,Free and Total  Hypogonadism male -     CBC with Differential/Platelet -     CMP14+EGFR -     Lipid panel -     Testosterone,Free and  Total  Spondylolisthesis at L4-L5 level  Spinal stenosis of lumbar region, unspecified whether neurogenic claudication present   I am having Audon Heymann. Ditter maintain his Calcium Carb-Cholecalciferol (CALCIUM + D3 PO), Multiple Vitamins-Minerals (CENTRUM SILVER PO), fexofenadine-pseudoephedrine, fluticasone, atorvastatin, methocarbamol, montelukast, omeprazole, and polyethylene glycol powder. We will continue to administer testosterone cypionate.  No orders of the defined types were placed in this encounter.    Follow-up: No follow-ups on file.  Claretta Fraise, M.D.

## 2020-08-14 LAB — CMP14+EGFR
ALT: 24 IU/L (ref 0–44)
AST: 35 IU/L (ref 0–40)
Albumin/Globulin Ratio: 2.7 — ABNORMAL HIGH (ref 1.2–2.2)
Albumin: 4.6 g/dL (ref 3.8–4.9)
Alkaline Phosphatase: 65 IU/L (ref 44–121)
BUN/Creatinine Ratio: 14 (ref 10–24)
BUN: 14 mg/dL (ref 8–27)
Bilirubin Total: 1.6 mg/dL — ABNORMAL HIGH (ref 0.0–1.2)
CO2: 25 mmol/L (ref 20–29)
Calcium: 9.3 mg/dL (ref 8.6–10.2)
Chloride: 95 mmol/L — ABNORMAL LOW (ref 96–106)
Creatinine, Ser: 1.02 mg/dL (ref 0.76–1.27)
GFR calc Af Amer: 92 mL/min/{1.73_m2} (ref >59)
GFR calc non Af Amer: 80 mL/min/{1.73_m2} (ref >59)
Globulin, Total: 1.7 g/dL (ref 1.5–4.5)
Glucose: 83 mg/dL (ref 65–99)
Potassium: 3.7 mmol/L (ref 3.5–5.2)
Sodium: 134 mmol/L (ref 134–144)
Total Protein: 6.3 g/dL (ref 6.0–8.5)

## 2020-08-14 LAB — LIPID PANEL
Chol/HDL Ratio: 2.1 ratio (ref 0.0–5.0)
Cholesterol, Total: 126 mg/dL (ref 100–199)
HDL: 60 mg/dL (ref >39)
LDL Chol Calc (NIH): 56 mg/dL (ref 0–99)
Triglycerides: 40 mg/dL (ref 0–149)
VLDL Cholesterol Cal: 10 mg/dL (ref 5–40)

## 2020-08-14 LAB — CBC WITH DIFFERENTIAL/PLATELET
Basophils Absolute: 0 10*3/uL (ref 0.0–0.2)
Basos: 1 %
EOS (ABSOLUTE): 0.1 10*3/uL (ref 0.0–0.4)
Eos: 2 %
Hematocrit: 43.7 % (ref 37.5–51.0)
Hemoglobin: 15.1 g/dL (ref 13.0–17.7)
Immature Grans (Abs): 0 10*3/uL (ref 0.0–0.1)
Immature Granulocytes: 0 %
Lymphocytes Absolute: 1.1 10*3/uL (ref 0.7–3.1)
Lymphs: 25 %
MCH: 31 pg (ref 26.6–33.0)
MCHC: 34.6 g/dL (ref 31.5–35.7)
MCV: 90 fL (ref 79–97)
Monocytes Absolute: 0.5 10*3/uL (ref 0.1–0.9)
Monocytes: 11 %
Neutrophils Absolute: 2.8 10*3/uL (ref 1.4–7.0)
Neutrophils: 61 %
Platelets: 238 10*3/uL (ref 150–450)
RBC: 4.87 x10E6/uL (ref 4.14–5.80)
RDW: 12.4 % (ref 11.6–15.4)
WBC: 4.5 10*3/uL (ref 3.4–10.8)

## 2020-08-14 LAB — TESTOSTERONE,FREE AND TOTAL
Testosterone, Free: 18.7 pg/mL — ABNORMAL HIGH (ref 6.6–18.1)
Testosterone: 1078 ng/dL — ABNORMAL HIGH (ref 264–916)

## 2020-08-16 ENCOUNTER — Other Ambulatory Visit: Payer: Self-pay | Admitting: Family Medicine

## 2020-08-16 ENCOUNTER — Encounter: Payer: Self-pay | Admitting: Family Medicine

## 2020-08-16 MED ORDER — TESTOSTERONE CYPIONATE 200 MG/ML IM SOLN: 160.0000 mg | INTRAMUSCULAR | 1 refills | Status: DC

## 2020-08-26 ENCOUNTER — Other Ambulatory Visit: Payer: Self-pay

## 2020-08-26 ENCOUNTER — Ambulatory Visit (INDEPENDENT_AMBULATORY_CARE_PROVIDER_SITE_OTHER): Payer: Federal, State, Local not specified - PPO

## 2020-08-26 ENCOUNTER — Ambulatory Visit: Payer: Federal, State, Local not specified - PPO

## 2020-08-26 DIAGNOSIS — E349 Endocrine disorder, unspecified: Secondary | ICD-10-CM
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
## 2020-09-09 ENCOUNTER — Other Ambulatory Visit: Payer: Self-pay

## 2020-09-09 ENCOUNTER — Ambulatory Visit (INDEPENDENT_AMBULATORY_CARE_PROVIDER_SITE_OTHER): Payer: Federal, State, Local not specified - PPO

## 2020-09-09 DIAGNOSIS — E349 Endocrine disorder, unspecified: Secondary | ICD-10-CM | POA: Diagnosis not present

## 2020-09-09 NOTE — Progress Notes (Signed)
Patient given Testerone injection and tolerated well.

## 2020-09-10 ENCOUNTER — Other Ambulatory Visit: Payer: Federal, State, Local not specified - PPO

## 2020-09-10 ENCOUNTER — Other Ambulatory Visit: Payer: Self-pay | Admitting: *Deleted

## 2020-09-10 DIAGNOSIS — E291 Testicular hypofunction: Secondary | ICD-10-CM

## 2020-09-10 DIAGNOSIS — E349 Endocrine disorder, unspecified: Secondary | ICD-10-CM

## 2020-09-12 LAB — TESTOSTERONE,FREE AND TOTAL
Testosterone, Free: 20.1 pg/mL — ABNORMAL HIGH (ref 6.6–18.1)
Testosterone: 1133 ng/dL — ABNORMAL HIGH (ref 264–916)

## 2020-09-14 ENCOUNTER — Other Ambulatory Visit: Payer: Self-pay | Admitting: Family Medicine

## 2020-09-14 MED ORDER — TESTOSTERONE CYPIONATE 200 MG/ML IM SOLN: 120.0000 mg | INTRAMUSCULAR | 1 refills | Status: DC

## 2020-09-16 ENCOUNTER — Other Ambulatory Visit: Payer: Self-pay | Admitting: *Deleted

## 2020-09-23 ENCOUNTER — Other Ambulatory Visit: Payer: Self-pay

## 2020-09-23 ENCOUNTER — Ambulatory Visit (INDEPENDENT_AMBULATORY_CARE_PROVIDER_SITE_OTHER): Payer: Federal, State, Local not specified - PPO

## 2020-09-23 DIAGNOSIS — E349 Endocrine disorder, unspecified: Secondary | ICD-10-CM

## 2020-09-23 MED ORDER — TESTOSTERONE CYPIONATE 200 MG/ML IM SOLN
120.0000 mg | INTRAMUSCULAR | Status: DC
  Administered 2020-09-23 – 2022-03-23 (×39): 120 mg via INTRAMUSCULAR

## 2020-10-07 ENCOUNTER — Other Ambulatory Visit: Payer: Self-pay

## 2020-10-07 ENCOUNTER — Ambulatory Visit (INDEPENDENT_AMBULATORY_CARE_PROVIDER_SITE_OTHER): Payer: Federal, State, Local not specified - PPO

## 2020-10-07 DIAGNOSIS — E349 Endocrine disorder, unspecified: Secondary | ICD-10-CM | POA: Diagnosis not present

## 2020-10-15 ENCOUNTER — Other Ambulatory Visit: Payer: Self-pay | Admitting: Family Medicine

## 2020-10-15 DIAGNOSIS — J301 Allergic rhinitis due to pollen: Secondary | ICD-10-CM

## 2020-10-21 ENCOUNTER — Other Ambulatory Visit: Payer: Self-pay

## 2020-10-21 ENCOUNTER — Ambulatory Visit (INDEPENDENT_AMBULATORY_CARE_PROVIDER_SITE_OTHER): Payer: Federal, State, Local not specified - PPO

## 2020-10-21 DIAGNOSIS — E291 Testicular hypofunction: Secondary | ICD-10-CM

## 2020-10-21 NOTE — Progress Notes (Signed)
Testosterone injection given to right upper outer quadrant.  Patient tolerated well. 

## 2020-11-04 ENCOUNTER — Other Ambulatory Visit: Payer: Self-pay

## 2020-11-04 ENCOUNTER — Ambulatory Visit (INDEPENDENT_AMBULATORY_CARE_PROVIDER_SITE_OTHER): Payer: Federal, State, Local not specified - PPO | Admitting: *Deleted

## 2020-11-04 DIAGNOSIS — E291 Testicular hypofunction: Secondary | ICD-10-CM

## 2020-11-05 DIAGNOSIS — M961 Postlaminectomy syndrome, not elsewhere classified: Secondary | ICD-10-CM | POA: Diagnosis not present

## 2020-11-05 DIAGNOSIS — M4326 Fusion of spine, lumbar region: Secondary | ICD-10-CM | POA: Diagnosis not present

## 2020-11-18 ENCOUNTER — Other Ambulatory Visit: Payer: Self-pay

## 2020-11-18 ENCOUNTER — Ambulatory Visit (INDEPENDENT_AMBULATORY_CARE_PROVIDER_SITE_OTHER): Payer: Federal, State, Local not specified - PPO

## 2020-11-18 DIAGNOSIS — E349 Endocrine disorder, unspecified: Secondary | ICD-10-CM | POA: Diagnosis not present

## 2020-11-18 NOTE — Progress Notes (Signed)
Pt tolerated testosterone injection well. 

## 2020-12-02 ENCOUNTER — Ambulatory Visit (INDEPENDENT_AMBULATORY_CARE_PROVIDER_SITE_OTHER): Payer: Federal, State, Local not specified - PPO

## 2020-12-02 ENCOUNTER — Other Ambulatory Visit: Payer: Self-pay

## 2020-12-02 DIAGNOSIS — E291 Testicular hypofunction: Secondary | ICD-10-CM

## 2020-12-02 DIAGNOSIS — E349 Endocrine disorder, unspecified: Secondary | ICD-10-CM

## 2020-12-02 NOTE — Progress Notes (Signed)
Testosterone injection given and patient tolerated well.  

## 2020-12-16 ENCOUNTER — Ambulatory Visit (INDEPENDENT_AMBULATORY_CARE_PROVIDER_SITE_OTHER): Payer: Federal, State, Local not specified - PPO | Admitting: *Deleted

## 2020-12-16 ENCOUNTER — Other Ambulatory Visit: Payer: Self-pay

## 2020-12-16 DIAGNOSIS — E291 Testicular hypofunction: Secondary | ICD-10-CM

## 2020-12-16 DIAGNOSIS — E349 Endocrine disorder, unspecified: Secondary | ICD-10-CM

## 2020-12-30 ENCOUNTER — Ambulatory Visit (INDEPENDENT_AMBULATORY_CARE_PROVIDER_SITE_OTHER): Payer: Federal, State, Local not specified - PPO

## 2020-12-30 ENCOUNTER — Other Ambulatory Visit: Payer: Self-pay

## 2020-12-30 DIAGNOSIS — E291 Testicular hypofunction: Secondary | ICD-10-CM

## 2020-12-30 NOTE — Progress Notes (Signed)
Testosterone injection given to left upper outer quadrant.  Patient tolerated well. 

## 2021-01-11 ENCOUNTER — Other Ambulatory Visit: Payer: Self-pay | Admitting: Family Medicine

## 2021-01-13 ENCOUNTER — Other Ambulatory Visit: Payer: Self-pay

## 2021-01-13 ENCOUNTER — Ambulatory Visit (INDEPENDENT_AMBULATORY_CARE_PROVIDER_SITE_OTHER): Payer: Federal, State, Local not specified - PPO | Admitting: *Deleted

## 2021-01-13 DIAGNOSIS — E291 Testicular hypofunction: Secondary | ICD-10-CM | POA: Diagnosis not present

## 2021-01-27 ENCOUNTER — Other Ambulatory Visit: Payer: Self-pay

## 2021-01-27 ENCOUNTER — Ambulatory Visit (INDEPENDENT_AMBULATORY_CARE_PROVIDER_SITE_OTHER): Payer: Federal, State, Local not specified - PPO | Admitting: *Deleted

## 2021-01-27 DIAGNOSIS — E291 Testicular hypofunction: Secondary | ICD-10-CM | POA: Diagnosis not present

## 2021-02-09 ENCOUNTER — Other Ambulatory Visit: Payer: Self-pay

## 2021-02-09 ENCOUNTER — Encounter: Payer: Federal, State, Local not specified - PPO | Admitting: Family Medicine

## 2021-02-10 ENCOUNTER — Ambulatory Visit: Payer: Self-pay

## 2021-02-11 ENCOUNTER — Other Ambulatory Visit: Payer: Self-pay | Admitting: Family Medicine

## 2021-02-17 ENCOUNTER — Telehealth: Payer: Self-pay | Admitting: Family Medicine

## 2021-02-17 NOTE — Telephone Encounter (Signed)
Patient has an appointment scheduled next Wednesday,02/24/21, for a testosterone injection and he is making sure it is okay for him to come in since he was exposed to Covid.  Patient's wife had Covid two weeks ago.  He does not have any symptoms.  Patient informed he may come in as long as he does not develop any symptoms between now and appointment.

## 2021-02-22 ENCOUNTER — Encounter: Payer: Federal, State, Local not specified - PPO | Admitting: Family Medicine

## 2021-02-24 ENCOUNTER — Other Ambulatory Visit: Payer: Self-pay

## 2021-02-24 ENCOUNTER — Ambulatory Visit (INDEPENDENT_AMBULATORY_CARE_PROVIDER_SITE_OTHER): Payer: Federal, State, Local not specified - PPO | Admitting: *Deleted

## 2021-02-24 DIAGNOSIS — E291 Testicular hypofunction: Secondary | ICD-10-CM | POA: Diagnosis not present

## 2021-02-24 NOTE — Progress Notes (Signed)
Pt tolerated testosterone injection to right upper outer quadrant well

## 2021-03-10 ENCOUNTER — Other Ambulatory Visit: Payer: Self-pay

## 2021-03-10 ENCOUNTER — Ambulatory Visit (INDEPENDENT_AMBULATORY_CARE_PROVIDER_SITE_OTHER): Payer: Federal, State, Local not specified - PPO | Admitting: *Deleted

## 2021-03-10 DIAGNOSIS — E349 Endocrine disorder, unspecified: Secondary | ICD-10-CM | POA: Diagnosis not present

## 2021-03-10 DIAGNOSIS — E291 Testicular hypofunction: Secondary | ICD-10-CM

## 2021-03-10 NOTE — Progress Notes (Signed)
Pt given 0.37ml/120mg  testosterone injection IM RUOQ glut and tolerated well.

## 2021-03-13 ENCOUNTER — Other Ambulatory Visit: Payer: Self-pay | Admitting: Family Medicine

## 2021-03-26 ENCOUNTER — Encounter: Payer: Self-pay | Admitting: Family Medicine

## 2021-03-26 ENCOUNTER — Other Ambulatory Visit: Payer: Self-pay

## 2021-03-26 ENCOUNTER — Ambulatory Visit (INDEPENDENT_AMBULATORY_CARE_PROVIDER_SITE_OTHER): Payer: Federal, State, Local not specified - PPO | Admitting: Family Medicine

## 2021-03-26 VITALS — BP 132/83 | HR 82 | Temp 98.1°F | Ht 68.0 in | Wt 175.0 lb

## 2021-03-26 DIAGNOSIS — J301 Allergic rhinitis due to pollen: Secondary | ICD-10-CM

## 2021-03-26 DIAGNOSIS — Z0000 Encounter for general adult medical examination without abnormal findings: Secondary | ICD-10-CM

## 2021-03-26 DIAGNOSIS — E782 Mixed hyperlipidemia: Secondary | ICD-10-CM | POA: Diagnosis not present

## 2021-03-26 DIAGNOSIS — E559 Vitamin D deficiency, unspecified: Secondary | ICD-10-CM

## 2021-03-26 DIAGNOSIS — K219 Gastro-esophageal reflux disease without esophagitis: Secondary | ICD-10-CM

## 2021-03-26 DIAGNOSIS — E119 Type 2 diabetes mellitus without complications: Secondary | ICD-10-CM | POA: Diagnosis not present

## 2021-03-26 DIAGNOSIS — E349 Endocrine disorder, unspecified: Secondary | ICD-10-CM | POA: Diagnosis not present

## 2021-03-26 DIAGNOSIS — Z0001 Encounter for general adult medical examination with abnormal findings: Secondary | ICD-10-CM

## 2021-03-26 DIAGNOSIS — Z23 Encounter for immunization: Secondary | ICD-10-CM

## 2021-03-26 MED ORDER — FLUTICASONE PROPIONATE 50 MCG/ACT NA SUSP: 2.0000 | Freq: Every day | NASAL | 3 refills | Status: DC

## 2021-03-26 MED ORDER — MONTELUKAST SODIUM 10 MG PO TABS: 10.0000 mg | ORAL_TABLET | Freq: Every day | ORAL | 1 refills | Status: DC

## 2021-03-26 MED ORDER — OMEPRAZOLE 40 MG PO CPDR: DELAYED_RELEASE_CAPSULE | ORAL | 3 refills | Status: DC

## 2021-03-26 MED ORDER — TESTOSTERONE CYPIONATE 200 MG/ML IM SOLN: 120.0000 mg | INTRAMUSCULAR | 0 refills | Status: DC

## 2021-03-26 MED ORDER — MELOXICAM 15 MG PO TABS: 15.0000 mg | ORAL_TABLET | ORAL | 3 refills | Status: DC

## 2021-03-26 NOTE — Progress Notes (Signed)
Complete physical  Subjective:  Patient ID: Cesar Leon, male    DOB: Oct 09, 1960  Age: 60 y.o. MRN: [id number]  CC: Annual Exam   HPI Cesar Leon presents for complete physical. Last Testosterone shot was 16 days ago.  Using meloxicam qod for athritis in his back. His back doctor put him on it. Saw arthritis on recent XR.    Depression screen Cesar Leon 2/9 03/26/2021 08/13/2020 02/12/2020  Decreased Interest 0 0 0  Down, Depressed, Hopeless 0 0 0  PHQ - 2 Score 0 0 0  Some recent data might be hidden    History Cesar Leon has a past medical history of Allergic rhinitis, Chronic pain, DDD (degenerative disc disease) (12/28/08), Femur fracture, right ([place]), GERD (gastroesophageal reflux disease), Hematuria, History of GI bleed, Hyperlipidemia, Hypogonadism male, Hypothyroidism, Internal hemorrhoid, Opioid type dependence, continuous ([place]esar [place]) (09/28/2015), and PVD (peripheral vascular disease) ([place]esar [place]).   He has a past surgical history that includes Carpal tunnel release (8/11) and right ankle  (1976).   His family history includes Anxiety disorder in his father; Depression in his father; Diabetes in his brother and father; Lung cancer in his father; Peripheral vascular disease in his father.He reports that he quit smoking about 26 years ago. His smoking use included cigarettes. He has a 30.00 pack-year smoking history. He has never used smokeless tobacco. He reports current alcohol use. He reports that he does not use drugs.    ROS Review of Systems  Constitutional:  Negative for activity change, fatigue and unexpected weight change.  HENT:  Negative for congestion, ear pain, hearing loss, postnasal drip and trouble swallowing.   Eyes:  Negative for pain and visual disturbance.  Respiratory:  Negative for cough, chest tightness and shortness of breath.   Cardiovascular:  Negative for chest pain, palpitations and leg swelling.  Gastrointestinal:  Negative for abdominal distention, abdominal  pain, blood in stool, constipation, diarrhea, nausea and vomiting.  Endocrine: Negative for cold intolerance, heat intolerance and polydipsia.  Genitourinary:  Negative for difficulty urinating, dysuria, flank pain, frequency and urgency.  Musculoskeletal:  Positive for arthralgias and back pain (Surgery was 5/21). Negative for joint swelling.  Skin:  Negative for color change, rash and wound.  Neurological:  Negative for dizziness, syncope, speech difficulty, weakness, light-headedness, numbness and headaches.  Hematological:  Does not bruise/bleed easily.  Psychiatric/Behavioral:  Negative for confusion, decreased concentration, dysphoric mood and sleep disturbance. The patient is not nervous/anxious.    Objective:  BP 132/83   Pulse 82   Temp 98.1 F (36.7 C)   Ht $R'5\' 8"'qg$  (1.727 m)   Wt 175 lb (79.4 kg)   SpO2 97%   BMI 26.61 kg/m   BP Readings from Last 3 Encounters:  03/26/21 132/83  08/13/20 129/73  02/12/20 124/89    Wt Readings from Last 3 Encounters:  03/26/21 175 lb (79.4 kg)  08/13/20 175 lb 2 oz (79.4 kg)  02/12/20 189 lb 2 oz (85.8 kg)     Physical Exam Constitutional:      Appearance: He is well-developed.  HENT:     Head: Normocephalic and atraumatic.  Eyes:     Pupils: Pupils are equal, round, and reactive to light.  Neck:     Thyroid: No thyromegaly.     Trachea: No tracheal deviation.  Cardiovascular:     Rate and Rhythm: Normal rate and regular rhythm.     Heart sounds: Normal heart sounds. No murmur heard.   No friction rub. No gallop.  Pulmonary:  Breath sounds: Normal breath sounds. No wheezing or rales.  Abdominal:     General: Bowel sounds are normal. There is no distension.     Palpations: Abdomen is soft. There is no mass.     Tenderness: There is no abdominal tenderness.     Hernia: There is no hernia in the left inguinal area.  Genitourinary:    Penis: Normal.      Testes: Normal.  Musculoskeletal:        General: Normal range of  motion.     Cervical back: Normal range of motion.     Comments: Good upper body muscle tone. Some muscle atrophy at the legs.    Lymphadenopathy:     Cervical: No cervical adenopathy.  Skin:    General: Skin is warm and dry.  Neurological:     Mental Status: He is alert and oriented to person, place, and time.      Assessment & Plan:   Cesar Leon was seen today for annual exam.  Diagnoses and all orders for this visit:  Mixed hyperlipidemia -     Lipid panel  Vitamin D deficiency -     VITAMIN D 25 Hydroxy (Vit-D Deficiency, Fractures)  Diabetes mellitus without complication ([place]) -     Microalbumin / creatinine urine ratio  Testosterone deficiency -     Testosterone,Free and Total  Well adult exam -     CBC with Differential/Platelet -     CMP14+EGFR -     Lipid panel -     Testosterone,Free and Total -     VITAMIN D 25 Hydroxy (Vit-D Deficiency, Fractures)  Need for shingles vaccine -     Varicella-zoster vaccine IM (Shingrix)  Need for immunization against influenza -     Flu Vaccine QUAD 32mo+IM (Fluarix, Fluzone & Alfiuria Quad PF)  Gastroesophageal reflux disease without esophagitis -     omeprazole (PRILOSEC) 40 MG capsule; TAKE ONE (1) CAPSULE EACH DAY  Seasonal allergic rhinitis due to pollen -     fluticasone (FLONASE) 50 MCG/ACT nasal spray; Place 2 sprays into both nostrils daily. -     montelukast (SINGULAIR) 10 MG tablet; Take 1 tablet (10 mg total) by mouth at bedtime.  Other orders -     meloxicam (MOBIC) 15 MG tablet; Take 1 tablet (15 mg total) by mouth every other day. -     testosterone cypionate (DEPOTESTOSTERONE CYPIONATE) 200 MG/ML injection; Inject 0.6 mLs (120 mg total) into the muscle every 14 (fourteen) days.      I have discontinued Janssen Zee. Holster's methocarbamol. I have also changed his fluticasone and meloxicam. Additionally, I am having him maintain his Calcium Carb-Cholecalciferol (CALCIUM + D3 PO), Multiple  Vitamins-Minerals (CENTRUM SILVER PO), fexofenadine-pseudoephedrine, atorvastatin, polyethylene glycol powder, omeprazole, montelukast, and testosterone cypionate. We administered testosterone cypionate. We will continue to administer testosterone cypionate.  Allergies as of 03/26/2021       Reactions   Metformin And Related Nausea And Vomiting   Codeine    Penicillins         Medication List        Accurate as of March 26, 2021  9:58 AM. If you have any questions, ask your nurse or doctor.          STOP taking these medications    methocarbamol 500 MG tablet Commonly known as: ROBAXIN Stopped by: Claretta Fraise, MD       TAKE these medications    atorvastatin 40 MG tablet Commonly known  as: LIPITOR TAKE ONE (1) TABLET EACH DAY   CALCIUM + D3 PO Take by mouth.   CENTRUM SILVER PO Take 1 tablet by mouth daily.   fexofenadine-pseudoephedrine 180-240 MG 24 hr tablet Commonly known as: ALLEGRA-D 24 Take 1 tablet by mouth every evening. For allergy and congestion   fluticasone 50 MCG/ACT nasal spray Commonly known as: FLONASE Place 2 sprays into both nostrils daily.   meloxicam 15 MG tablet Commonly known as: MOBIC Take 1 tablet (15 mg total) by mouth every other day.   montelukast 10 MG tablet Commonly known as: SINGULAIR Take 1 tablet (10 mg total) by mouth at bedtime.   omeprazole 40 MG capsule Commonly known as: PRILOSEC TAKE ONE (1) CAPSULE EACH DAY   polyethylene glycol powder 17 GM/SCOOP powder Commonly known as: GLYCOLAX/MIRALAX MIX 17 GRAMS INTO 8OZ OF WATER AND DRINK DAILY   testosterone cypionate 200 MG/ML injection Commonly known as: DEPOTESTOSTERONE CYPIONATE Inject 0.6 mLs (120 mg total) into the muscle every 14 (fourteen) days.         Follow-up: No follow-ups on file.  Claretta Fraise, M.D.

## 2021-03-30 LAB — CBC WITH DIFFERENTIAL/PLATELET
Basophils Absolute: 0 10*3/uL (ref 0.0–0.2)
Basos: 0 %
EOS (ABSOLUTE): 0.1 10*3/uL (ref 0.0–0.4)
Eos: 4 %
Hematocrit: 41.8 % (ref 37.5–51.0)
Hemoglobin: 14.1 g/dL (ref 13.0–17.7)
Immature Grans (Abs): 0 10*3/uL (ref 0.0–0.1)
Immature Granulocytes: 0 %
Lymphocytes Absolute: 1 10*3/uL (ref 0.7–3.1)
Lymphs: 25 %
MCH: 30.9 pg (ref 26.6–33.0)
MCHC: 33.7 g/dL (ref 31.5–35.7)
MCV: 92 fL (ref 79–97)
Monocytes Absolute: 0.4 10*3/uL (ref 0.1–0.9)
Monocytes: 11 %
Neutrophils Absolute: 2.2 10*3/uL (ref 1.4–7.0)
Neutrophils: 60 %
Platelets: 248 10*3/uL (ref 150–450)
RBC: 4.56 x10E6/uL (ref 4.14–5.80)
RDW: 11.9 % (ref 11.6–15.4)
WBC: 3.7 10*3/uL (ref 3.4–10.8)

## 2021-03-30 LAB — CMP14+EGFR
ALT: 24 IU/L (ref 0–44)
AST: 27 IU/L (ref 0–40)
Albumin/Globulin Ratio: 2.4 — ABNORMAL HIGH (ref 1.2–2.2)
Albumin: 4.6 g/dL (ref 3.8–4.8)
Alkaline Phosphatase: 66 IU/L (ref 44–121)
BUN/Creatinine Ratio: 21 (ref 10–24)
BUN: 20 mg/dL (ref 8–27)
Bilirubin Total: 0.9 mg/dL (ref 0.0–1.2)
CO2: 26 mmol/L (ref 20–29)
Calcium: 9.5 mg/dL (ref 8.6–10.2)
Chloride: 97 mmol/L (ref 96–106)
Creatinine, Ser: 0.95 mg/dL (ref 0.76–1.27)
Globulin, Total: 1.9 g/dL (ref 1.5–4.5)
Glucose: 113 mg/dL — ABNORMAL HIGH (ref 70–99)
Potassium: 4.4 mmol/L (ref 3.5–5.2)
Sodium: 140 mmol/L (ref 134–144)
Total Protein: 6.5 g/dL (ref 6.0–8.5)
eGFR: 91 mL/min/{1.73_m2} (ref >59)

## 2021-03-30 LAB — LIPID PANEL
Chol/HDL Ratio: 2.1 ratio (ref 0.0–5.0)
Cholesterol, Total: 129 mg/dL (ref 100–199)
HDL: 61 mg/dL (ref >39)
LDL Chol Calc (NIH): 55 mg/dL (ref 0–99)
Triglycerides: 59 mg/dL (ref 0–149)
VLDL Cholesterol Cal: 13 mg/dL (ref 5–40)

## 2021-03-30 LAB — VITAMIN D 25 HYDROXY (VIT D DEFICIENCY, FRACTURES): Vit D, 25-Hydroxy: 67.9 ng/mL (ref 30.0–100.0)

## 2021-03-30 LAB — TESTOSTERONE,FREE AND TOTAL
Testosterone, Free: 1.7 pg/mL — ABNORMAL LOW (ref 6.6–18.1)
Testosterone: 97 ng/dL — ABNORMAL LOW (ref 264–916)

## 2021-04-07 ENCOUNTER — Ambulatory Visit (INDEPENDENT_AMBULATORY_CARE_PROVIDER_SITE_OTHER): Payer: Federal, State, Local not specified - PPO | Admitting: Family Medicine

## 2021-04-07 ENCOUNTER — Other Ambulatory Visit: Payer: Self-pay

## 2021-04-07 DIAGNOSIS — E291 Testicular hypofunction: Secondary | ICD-10-CM | POA: Diagnosis not present

## 2021-04-07 DIAGNOSIS — E349 Endocrine disorder, unspecified: Secondary | ICD-10-CM

## 2021-04-12 ENCOUNTER — Other Ambulatory Visit: Payer: Self-pay | Admitting: Family Medicine

## 2021-04-21 ENCOUNTER — Other Ambulatory Visit: Payer: Self-pay

## 2021-04-21 ENCOUNTER — Ambulatory Visit (INDEPENDENT_AMBULATORY_CARE_PROVIDER_SITE_OTHER): Payer: Federal, State, Local not specified - PPO | Admitting: *Deleted

## 2021-04-21 DIAGNOSIS — E349 Endocrine disorder, unspecified: Secondary | ICD-10-CM

## 2021-04-21 DIAGNOSIS — E291 Testicular hypofunction: Secondary | ICD-10-CM | POA: Diagnosis not present

## 2021-05-05 ENCOUNTER — Other Ambulatory Visit: Payer: Self-pay

## 2021-05-05 ENCOUNTER — Ambulatory Visit (INDEPENDENT_AMBULATORY_CARE_PROVIDER_SITE_OTHER): Payer: Federal, State, Local not specified - PPO | Admitting: *Deleted

## 2021-05-05 DIAGNOSIS — E291 Testicular hypofunction: Secondary | ICD-10-CM

## 2021-05-05 DIAGNOSIS — E349 Endocrine disorder, unspecified: Secondary | ICD-10-CM

## 2021-05-19 ENCOUNTER — Ambulatory Visit (INDEPENDENT_AMBULATORY_CARE_PROVIDER_SITE_OTHER): Payer: Federal, State, Local not specified - PPO

## 2021-05-19 ENCOUNTER — Other Ambulatory Visit: Payer: Self-pay

## 2021-05-19 DIAGNOSIS — E291 Testicular hypofunction: Secondary | ICD-10-CM

## 2021-05-19 NOTE — Progress Notes (Signed)
Testosterone injection given to left upper outer quadrant.  Patient tolerated well. 

## 2021-06-02 ENCOUNTER — Ambulatory Visit (INDEPENDENT_AMBULATORY_CARE_PROVIDER_SITE_OTHER): Payer: Federal, State, Local not specified - PPO

## 2021-06-02 DIAGNOSIS — E291 Testicular hypofunction: Secondary | ICD-10-CM

## 2021-06-02 DIAGNOSIS — Z23 Encounter for immunization: Secondary | ICD-10-CM

## 2021-06-02 NOTE — Progress Notes (Signed)
Testosterone injection given to left upper outer quadrant.  Patient tolerated well. 

## 2021-06-13 ENCOUNTER — Other Ambulatory Visit: Payer: Self-pay | Admitting: Family Medicine

## 2021-06-16 ENCOUNTER — Ambulatory Visit (INDEPENDENT_AMBULATORY_CARE_PROVIDER_SITE_OTHER): Payer: Federal, State, Local not specified - PPO

## 2021-06-16 DIAGNOSIS — E291 Testicular hypofunction: Secondary | ICD-10-CM | POA: Diagnosis not present

## 2021-06-16 NOTE — Progress Notes (Signed)
Testosterone injection given to right upper outer quadrant.  Patient tolerated well. 

## 2021-06-30 ENCOUNTER — Ambulatory Visit (INDEPENDENT_AMBULATORY_CARE_PROVIDER_SITE_OTHER): Payer: Federal, State, Local not specified - PPO | Admitting: *Deleted

## 2021-06-30 DIAGNOSIS — E291 Testicular hypofunction: Secondary | ICD-10-CM

## 2021-07-14 ENCOUNTER — Ambulatory Visit (INDEPENDENT_AMBULATORY_CARE_PROVIDER_SITE_OTHER): Payer: Federal, State, Local not specified - PPO

## 2021-07-14 DIAGNOSIS — E291 Testicular hypofunction: Secondary | ICD-10-CM

## 2021-07-14 NOTE — Progress Notes (Signed)
Testosterone injection given to right upper outer quadrant.  Patient tolerated well. 

## 2021-07-28 ENCOUNTER — Ambulatory Visit (INDEPENDENT_AMBULATORY_CARE_PROVIDER_SITE_OTHER): Payer: Federal, State, Local not specified - PPO | Admitting: *Deleted

## 2021-07-28 DIAGNOSIS — E291 Testicular hypofunction: Secondary | ICD-10-CM

## 2021-07-28 NOTE — Progress Notes (Signed)
Testosterone injection given and patient tolerated well.  

## 2021-08-04 ENCOUNTER — Encounter: Payer: Self-pay | Admitting: *Deleted

## 2021-08-04 ENCOUNTER — Other Ambulatory Visit: Payer: Self-pay | Admitting: Family Medicine

## 2021-08-11 ENCOUNTER — Ambulatory Visit (INDEPENDENT_AMBULATORY_CARE_PROVIDER_SITE_OTHER): Payer: Federal, State, Local not specified - PPO | Admitting: *Deleted

## 2021-08-11 DIAGNOSIS — E349 Endocrine disorder, unspecified: Secondary | ICD-10-CM | POA: Diagnosis not present

## 2021-08-11 DIAGNOSIS — E291 Testicular hypofunction: Secondary | ICD-10-CM

## 2021-08-11 NOTE — Progress Notes (Signed)
Patient in today for bi weekly testosterone injection. 120 mg given IM in right upper outer quadrant. Patient tolerated well.

## 2021-08-25 ENCOUNTER — Ambulatory Visit (INDEPENDENT_AMBULATORY_CARE_PROVIDER_SITE_OTHER): Payer: Federal, State, Local not specified - PPO

## 2021-08-25 DIAGNOSIS — E291 Testicular hypofunction: Secondary | ICD-10-CM | POA: Diagnosis not present

## 2021-08-25 NOTE — Progress Notes (Signed)
Testosterone injection given to left upper outer quadrant.  Patient tolerated well. 

## 2021-09-08 ENCOUNTER — Ambulatory Visit (INDEPENDENT_AMBULATORY_CARE_PROVIDER_SITE_OTHER): Payer: Federal, State, Local not specified - PPO

## 2021-09-08 DIAGNOSIS — E291 Testicular hypofunction: Secondary | ICD-10-CM | POA: Diagnosis not present

## 2021-09-08 NOTE — Progress Notes (Signed)
Testosterone injection given to right upper outer quadrant.  Patient tolerated well. 

## 2021-09-19 ENCOUNTER — Other Ambulatory Visit: Payer: Self-pay | Admitting: Family Medicine

## 2021-09-21 ENCOUNTER — Ambulatory Visit: Payer: Federal, State, Local not specified - PPO | Admitting: Family Medicine

## 2021-09-21 ENCOUNTER — Encounter: Payer: Self-pay | Admitting: Family Medicine

## 2021-09-21 VITALS — BP 113/69 | HR 77 | Temp 98.1°F | Ht 68.0 in | Wt 179.2 lb

## 2021-09-21 DIAGNOSIS — E782 Mixed hyperlipidemia: Secondary | ICD-10-CM | POA: Diagnosis not present

## 2021-09-21 DIAGNOSIS — E119 Type 2 diabetes mellitus without complications: Secondary | ICD-10-CM

## 2021-09-21 DIAGNOSIS — J301 Allergic rhinitis due to pollen: Secondary | ICD-10-CM | POA: Diagnosis not present

## 2021-09-21 DIAGNOSIS — E349 Endocrine disorder, unspecified: Secondary | ICD-10-CM | POA: Diagnosis not present

## 2021-09-21 LAB — BAYER DCA HB A1C WAIVED: HB A1C (BAYER DCA - WAIVED): 5.4 % (ref 4.8–5.6)

## 2021-09-21 MED ORDER — MONTELUKAST SODIUM 10 MG PO TABS: 10.0000 mg | ORAL_TABLET | Freq: Every day | ORAL | 3 refills | Status: DC

## 2021-09-21 NOTE — Progress Notes (Signed)
? ?Subjective:  ?Patient ID: Cesar Leon,  ?male    DOB: Sep 14, 1960  Age: 61 y.o.  ? ? ?CC: Medical Management of Chronic Issues ? ? ?HPI ?Cesar Leon presents for  follow-up of hypertension. Patient has no history of headache chest pain or shortness of breath or recent cough. Patient also denies symptoms of TIA such as numbness weakness lateralizing. Patient denies side effects from medication. States taking it regularly. ? ?Patient also  in for follow-up of elevated cholesterol. Doing well without complaints on current medication. Denies side effects  including myalgia and arthralgia and nausea. Also in today for liver function testing. Currently no chest pain, shortness of breath or other cardiovascular related symptoms noted. ? ?Follow-up of diabetes. Patient does not check blood sugar at home. Patient denies symptoms such as excessive hunger or urinary frequency, excessive hunger, nausea ?No significant hypoglycemic spells noted. ?Medications reviewed. Pt reports taking them regularly. Pt. denies complication/adverse reaction today.  ? ?Testosterone shot due tomorrow. Last injection was March 15.  ? ?Patient in for follow-up of GERD. Currently asymptomatic taking  PPI daily. There is no chest pain or heartburn. No hematemesis and no melena. No dysphagia or choking. Onset is remote. Progression is stable. Complicating factors, none. ? ?History ?Cesar Leon has a past medical history of Allergic rhinitis, Chronic pain, DDD (degenerative disc disease) (12/28/08), Femur fracture, right ([place]), GERD (gastroesophageal reflux disease), Hematuria, History of GI bleed, Hyperlipidemia, Hypogonadism male, Hypothyroidism, Internal hemorrhoid, Opioid type dependence, continuous ([place]) (09/28/2015), and PVD (peripheral vascular disease) ([place]).  ? ?Cesar Leon has a past surgical history that includes Carpal tunnel release (8/11) and right ankle  (1976).  ? ?His family history includes Anxiety disorder in his father; Depression in  his father; Diabetes in his brother and father; Lung cancer in his father; Peripheral vascular disease in his father.Cesar Leon reports that Cesar Leon quit smoking about 27 years ago. His smoking use included cigarettes. Cesar Leon has a 30.00 pack-year smoking history. Cesar Leon has never used smokeless tobacco. Cesar Leon reports current alcohol use. Cesar Leon reports that Cesar Leon does not use drugs. ? ?Current Outpatient Medications on File Prior to Visit  ?Medication Sig Dispense Refill  ? atorvastatin (LIPITOR) 40 MG tablet TAKE ONE (1) TABLET EACH DAY 90 tablet 0  ? Calcium Carb-Cholecalciferol (CALCIUM + D3 PO) Take by mouth.    ? fexofenadine-pseudoephedrine (ALLEGRA-D 24) 180-240 MG 24 hr tablet Take 1 tablet by mouth every evening. For allergy and congestion 30 tablet 11  ? fluticasone (FLONASE) 50 MCG/ACT nasal spray Place 2 sprays into both nostrils daily. 54.6 mL 3  ? meloxicam (MOBIC) 15 MG tablet Take 1 tablet (15 mg total) by mouth every other day. 45 tablet 3  ? Multiple Vitamins-Minerals (CENTRUM SILVER PO) Take 1 tablet by mouth daily.    ? omeprazole (PRILOSEC) 40 MG capsule TAKE ONE (1) CAPSULE EACH DAY 90 capsule 3  ? polyethylene glycol powder (GLYCOLAX/MIRALAX) 17 GM/SCOOP powder MIX 17 GRAMS INTO 8OZ OF WATER AND DRINK DAILY 510 g 0  ? testosterone cypionate (DEPOTESTOSTERONE CYPIONATE) 200 MG/ML injection Inject 0.6 mLs (120 mg total) into the muscle every 14 (fourteen) days. 8 mL 0  ? ?Current Facility-Administered Medications on File Prior to Visit  ?Medication Dose Route Frequency Provider Last Rate Last Admin  ? testosterone cypionate (DEPOTESTOSTERONE CYPIONATE) injection 120 mg  120 mg Intramuscular Q14 Days Claretta Fraise, MD   120 mg at 09/08/21 0810  ? ? ?ROS ?Review of Systems  ?Constitutional: Negative.   ?HENT: Negative.    ?  Eyes:  Negative for visual disturbance.  ?Respiratory:  Negative for cough and shortness of breath.   ?Cardiovascular:  Negative for chest pain and leg swelling.  ?Gastrointestinal:  Negative for abdominal  pain, diarrhea, nausea and vomiting.  ?Genitourinary:  Negative for difficulty urinating.  ?Musculoskeletal:  Positive for arthralgias (left knee pain, candidate for replacement). Negative for myalgias.  ?Skin:  Negative for rash.  ?Neurological:  Negative for headaches.  ?Psychiatric/Behavioral:  Negative for sleep disturbance.   ? ?Objective:  ?BP 113/69   Pulse 77   Temp 98.1 ?F (36.7 ?C)   Ht _0  (1.727 m)   Wt 179 lb 3.2 oz (81.3 kg)   SpO2 97%   BMI 27.25 kg/m?  ? ?BP Readings from Last 3 Encounters:  ?09/21/21 113/69  ?03/26/21 132/83  ?08/13/20 129/73  ? ? ?Wt Readings from Last 3 Encounters:  ?09/21/21 179 lb 3.2 oz (81.3 kg)  ?03/26/21 175 lb (79.4 kg)  ?08/13/20 175 lb 2 oz (79.4 kg)  ? ? ? ?Physical Exam ?Vitals reviewed.  ?Constitutional:   ?   Appearance: Cesar Leon is well-developed.  ?HENT:  ?   Head: Normocephalic and atraumatic.  ?   Right Ear: External ear normal.  ?   Left Ear: External ear normal.  ?   Mouth/Throat:  ?   Pharynx: No oropharyngeal exudate or posterior oropharyngeal erythema.  ?Eyes:  ?   Pupils: Pupils are equal, round, and reactive to light.  ?Cardiovascular:  ?   Rate and Rhythm: Normal rate and regular rhythm.  ?   Heart sounds: No murmur heard. ?Pulmonary:  ?   Effort: No respiratory distress.  ?   Breath sounds: Normal breath sounds.  ?Musculoskeletal:  ?   Cervical back: Normal range of motion and neck supple.  ?Neurological:  ?   Mental Status: Cesar Leon is alert and oriented to person, place, and time.  ? ? ?Diabetic Foot Exam - Simple   ?No data filed ?  ? ? ? ? ?Assessment & Plan:  ? ?Cesar Leon was seen today for medical management of chronic issues. ? ?Diagnoses and all orders for this visit: ? ?Diabetes mellitus without complication ([place]) ?-     CBC with Differential/Platelet ?-     CMP14+EGFR ?-     Bayer DCA Hb A1c Waived ?-     Microalbumin / creatinine urine ratio ? ?Mixed hyperlipidemia ?-     Lipid panel ? ?Testosterone deficiency ?-     Testosterone,Free and  Total ? ?Seasonal allergic rhinitis due to pollen ?-     montelukast (SINGULAIR) 10 MG tablet; Take 1 tablet (10 mg total) by mouth at bedtime. ? ? ?I am having Cesar Leon maintain his Calcium Carb-Cholecalciferol (CALCIUM + D3 PO), Multiple Vitamins-Minerals (CENTRUM SILVER PO), fexofenadine-pseudoephedrine, omeprazole, fluticasone, meloxicam, testosterone cypionate, atorvastatin, polyethylene glycol powder, and montelukast. We will continue to administer testosterone cypionate. ? ?Meds ordered this encounter  ?Medications  ? montelukast (SINGULAIR) 10 MG tablet  ?  Sig: Take 1 tablet (10 mg total) by mouth at bedtime.  ?  Dispense:  90 tablet  ?  Refill:  3  ? ? ? ?Follow-up: Return in about 6 months (around 03/24/2022). ? ?Claretta Fraise, M.D. ?

## 2021-09-22 ENCOUNTER — Ambulatory Visit: Payer: Federal, State, Local not specified - PPO

## 2021-09-22 LAB — MICROALBUMIN / CREATININE URINE RATIO
Creatinine, Urine: 18.6 mg/dL
Microalb/Creat Ratio: <16 mg/g creat (ref 0–29)
Microalbumin, Urine: <3 ug/mL

## 2021-09-23 ENCOUNTER — Ambulatory Visit: Payer: Federal, State, Local not specified - PPO | Admitting: Family Medicine

## 2021-09-24 LAB — CBC WITH DIFFERENTIAL/PLATELET
Basophils Absolute: 0 10*3/uL (ref 0.0–0.2)
Basos: 0 %
EOS (ABSOLUTE): 0.2 10*3/uL (ref 0.0–0.4)
Eos: 4 %
Hematocrit: 43.3 % (ref 37.5–51.0)
Hemoglobin: 14.9 g/dL (ref 13.0–17.7)
Immature Grans (Abs): 0 10*3/uL (ref 0.0–0.1)
Immature Granulocytes: 0 %
Lymphocytes Absolute: 1.2 10*3/uL (ref 0.7–3.1)
Lymphs: 25 %
MCH: 31 pg (ref 26.6–33.0)
MCHC: 34.4 g/dL (ref 31.5–35.7)
MCV: 90 fL (ref 79–97)
Monocytes Absolute: 0.4 10*3/uL (ref 0.1–0.9)
Monocytes: 9 %
Neutrophils Absolute: 2.8 10*3/uL (ref 1.4–7.0)
Neutrophils: 62 %
Platelets: 260 10*3/uL (ref 150–450)
RBC: 4.8 x10E6/uL (ref 4.14–5.80)
RDW: 11.9 % (ref 11.6–15.4)
WBC: 4.6 10*3/uL (ref 3.4–10.8)

## 2021-09-24 LAB — CMP14+EGFR
ALT: 19 IU/L (ref 0–44)
AST: 20 IU/L (ref 0–40)
Albumin/Globulin Ratio: 2 (ref 1.2–2.2)
Albumin: 4.6 g/dL (ref 3.8–4.8)
Alkaline Phosphatase: 84 IU/L (ref 44–121)
BUN/Creatinine Ratio: 17 (ref 10–24)
BUN: 15 mg/dL (ref 8–27)
Bilirubin Total: 0.6 mg/dL (ref 0.0–1.2)
CO2: 28 mmol/L (ref 20–29)
Calcium: 9.2 mg/dL (ref 8.6–10.2)
Chloride: 99 mmol/L (ref 96–106)
Creatinine, Ser: 0.87 mg/dL (ref 0.76–1.27)
Globulin, Total: 2.3 g/dL (ref 1.5–4.5)
Glucose: 121 mg/dL — ABNORMAL HIGH (ref 70–99)
Potassium: 4.1 mmol/L (ref 3.5–5.2)
Sodium: 141 mmol/L (ref 134–144)
Total Protein: 6.9 g/dL (ref 6.0–8.5)
eGFR: 98 mL/min/{1.73_m2} (ref >59)

## 2021-09-24 LAB — LIPID PANEL
Chol/HDL Ratio: 2.3 ratio (ref 0.0–5.0)
Cholesterol, Total: 113 mg/dL (ref 100–199)
HDL: 49 mg/dL (ref >39)
LDL Chol Calc (NIH): 52 mg/dL (ref 0–99)
Triglycerides: 51 mg/dL (ref 0–149)
VLDL Cholesterol Cal: 12 mg/dL (ref 5–40)

## 2021-09-24 LAB — TESTOSTERONE,FREE AND TOTAL
Testosterone, Free: 2.4 pg/mL — ABNORMAL LOW (ref 6.6–18.1)
Testosterone: 134 ng/dL — ABNORMAL LOW (ref 264–916)

## 2021-10-06 ENCOUNTER — Ambulatory Visit (INDEPENDENT_AMBULATORY_CARE_PROVIDER_SITE_OTHER): Payer: Federal, State, Local not specified - PPO | Admitting: Family Medicine

## 2021-10-06 DIAGNOSIS — E349 Endocrine disorder, unspecified: Secondary | ICD-10-CM

## 2021-10-06 DIAGNOSIS — E291 Testicular hypofunction: Secondary | ICD-10-CM | POA: Diagnosis not present

## 2021-10-17 ENCOUNTER — Other Ambulatory Visit: Payer: Self-pay | Admitting: Family Medicine

## 2021-10-20 ENCOUNTER — Ambulatory Visit (INDEPENDENT_AMBULATORY_CARE_PROVIDER_SITE_OTHER): Payer: Federal, State, Local not specified - PPO | Admitting: Emergency Medicine

## 2021-10-20 DIAGNOSIS — E291 Testicular hypofunction: Secondary | ICD-10-CM | POA: Diagnosis not present

## 2021-10-20 DIAGNOSIS — E349 Endocrine disorder, unspecified: Secondary | ICD-10-CM

## 2021-11-01 ENCOUNTER — Other Ambulatory Visit: Payer: Self-pay | Admitting: Family Medicine

## 2021-11-03 ENCOUNTER — Ambulatory Visit (INDEPENDENT_AMBULATORY_CARE_PROVIDER_SITE_OTHER): Payer: Federal, State, Local not specified - PPO | Admitting: Emergency Medicine

## 2021-11-03 DIAGNOSIS — E291 Testicular hypofunction: Secondary | ICD-10-CM

## 2021-11-03 DIAGNOSIS — E349 Endocrine disorder, unspecified: Secondary | ICD-10-CM | POA: Diagnosis not present

## 2021-11-04 DIAGNOSIS — K080 Exfoliation of teeth due to systemic causes: Secondary | ICD-10-CM | POA: Diagnosis not present

## 2021-11-17 ENCOUNTER — Ambulatory Visit (INDEPENDENT_AMBULATORY_CARE_PROVIDER_SITE_OTHER): Payer: Federal, State, Local not specified - PPO | Admitting: *Deleted

## 2021-11-17 DIAGNOSIS — E291 Testicular hypofunction: Secondary | ICD-10-CM

## 2021-11-17 DIAGNOSIS — E349 Endocrine disorder, unspecified: Secondary | ICD-10-CM

## 2021-12-01 ENCOUNTER — Ambulatory Visit (INDEPENDENT_AMBULATORY_CARE_PROVIDER_SITE_OTHER): Payer: Federal, State, Local not specified - PPO | Admitting: Emergency Medicine

## 2021-12-01 DIAGNOSIS — E291 Testicular hypofunction: Secondary | ICD-10-CM | POA: Diagnosis not present

## 2021-12-14 ENCOUNTER — Other Ambulatory Visit: Payer: Self-pay | Admitting: Family Medicine

## 2021-12-14 NOTE — Telephone Encounter (Signed)
One dose sent. Patient is scheduled to come in tomorrow for injection. Needs UDS and CSA completed while he is here as this is a controlled substance.

## 2021-12-15 ENCOUNTER — Ambulatory Visit (INDEPENDENT_AMBULATORY_CARE_PROVIDER_SITE_OTHER): Payer: Federal, State, Local not specified - PPO

## 2021-12-15 DIAGNOSIS — E291 Testicular hypofunction: Secondary | ICD-10-CM | POA: Diagnosis not present

## 2021-12-15 NOTE — Progress Notes (Signed)
Testosterone injection given to right upper outer quadrant.  Patient tolerated well. 

## 2021-12-17 ENCOUNTER — Other Ambulatory Visit: Payer: Self-pay | Admitting: Family Medicine

## 2021-12-17 DIAGNOSIS — K219 Gastro-esophageal reflux disease without esophagitis: Secondary | ICD-10-CM

## 2021-12-18 LAB — TOXASSURE SELECT 13 (MW), URINE

## 2021-12-29 ENCOUNTER — Ambulatory Visit (INDEPENDENT_AMBULATORY_CARE_PROVIDER_SITE_OTHER): Payer: Federal, State, Local not specified - PPO

## 2021-12-29 DIAGNOSIS — E291 Testicular hypofunction: Secondary | ICD-10-CM | POA: Diagnosis not present

## 2021-12-29 NOTE — Progress Notes (Signed)
Testosterone injection given to left upper outer quadrant.  Patient tolerated well. 

## 2022-01-12 ENCOUNTER — Ambulatory Visit (INDEPENDENT_AMBULATORY_CARE_PROVIDER_SITE_OTHER): Payer: Federal, State, Local not specified - PPO | Admitting: *Deleted

## 2022-01-12 DIAGNOSIS — E291 Testicular hypofunction: Secondary | ICD-10-CM | POA: Diagnosis not present

## 2022-01-12 NOTE — Progress Notes (Signed)
Patient in office today for bi-weekly Testosterone injection. .6mg  given IM in right upper outer quadrant. Patient tolerated well.

## 2022-01-24 ENCOUNTER — Other Ambulatory Visit: Payer: Self-pay | Admitting: Family Medicine

## 2022-01-26 ENCOUNTER — Ambulatory Visit (INDEPENDENT_AMBULATORY_CARE_PROVIDER_SITE_OTHER): Payer: Federal, State, Local not specified - PPO | Admitting: *Deleted

## 2022-01-26 DIAGNOSIS — E291 Testicular hypofunction: Secondary | ICD-10-CM | POA: Diagnosis not present

## 2022-01-31 ENCOUNTER — Telehealth: Payer: Self-pay | Admitting: Family Medicine

## 2022-01-31 NOTE — Telephone Encounter (Signed)
Let pt know he was diagnosed with hypogonadism 09/22/2014

## 2022-01-31 NOTE — Telephone Encounter (Signed)
Lmtcb.

## 2022-02-09 ENCOUNTER — Ambulatory Visit (INDEPENDENT_AMBULATORY_CARE_PROVIDER_SITE_OTHER): Payer: Federal, State, Local not specified - PPO | Admitting: *Deleted

## 2022-02-09 DIAGNOSIS — E291 Testicular hypofunction: Secondary | ICD-10-CM | POA: Diagnosis not present

## 2022-02-09 DIAGNOSIS — E349 Endocrine disorder, unspecified: Secondary | ICD-10-CM | POA: Diagnosis not present

## 2022-02-09 NOTE — Progress Notes (Signed)
Testosterone injection given and patient tolerated well.  

## 2022-02-23 ENCOUNTER — Ambulatory Visit (INDEPENDENT_AMBULATORY_CARE_PROVIDER_SITE_OTHER): Payer: Federal, State, Local not specified - PPO | Admitting: *Deleted

## 2022-02-23 DIAGNOSIS — E291 Testicular hypofunction: Secondary | ICD-10-CM

## 2022-03-09 ENCOUNTER — Ambulatory Visit (INDEPENDENT_AMBULATORY_CARE_PROVIDER_SITE_OTHER): Payer: Federal, State, Local not specified - PPO | Admitting: *Deleted

## 2022-03-09 DIAGNOSIS — E291 Testicular hypofunction: Secondary | ICD-10-CM

## 2022-03-09 DIAGNOSIS — E349 Endocrine disorder, unspecified: Secondary | ICD-10-CM | POA: Diagnosis not present

## 2022-03-09 NOTE — Progress Notes (Signed)
Testosterone injection given and patient tolerated well.  

## 2022-03-15 ENCOUNTER — Other Ambulatory Visit: Payer: Self-pay | Admitting: Family Medicine

## 2022-03-15 DIAGNOSIS — K219 Gastro-esophageal reflux disease without esophagitis: Secondary | ICD-10-CM

## 2022-03-23 ENCOUNTER — Ambulatory Visit (INDEPENDENT_AMBULATORY_CARE_PROVIDER_SITE_OTHER): Payer: Federal, State, Local not specified - PPO

## 2022-03-23 DIAGNOSIS — Z23 Encounter for immunization: Secondary | ICD-10-CM

## 2022-03-23 DIAGNOSIS — E291 Testicular hypofunction: Secondary | ICD-10-CM

## 2022-03-23 NOTE — Progress Notes (Signed)
Testosterone injection given to left upper outer quadrant.  Patient tolerated well. 

## 2022-03-28 ENCOUNTER — Ambulatory Visit: Payer: Federal, State, Local not specified - PPO | Admitting: Family Medicine

## 2022-03-29 ENCOUNTER — Ambulatory Visit: Payer: Medicare Other | Admitting: Family Medicine

## 2022-03-29 ENCOUNTER — Encounter: Payer: Self-pay | Admitting: Family Medicine

## 2022-03-29 VITALS — BP 119/74 | HR 88 | Temp 98.3°F | Ht 68.0 in | Wt 179.2 lb

## 2022-03-29 DIAGNOSIS — M4316 Spondylolisthesis, lumbar region: Secondary | ICD-10-CM

## 2022-03-29 DIAGNOSIS — E782 Mixed hyperlipidemia: Secondary | ICD-10-CM | POA: Diagnosis not present

## 2022-03-29 DIAGNOSIS — E291 Testicular hypofunction: Secondary | ICD-10-CM

## 2022-03-29 DIAGNOSIS — K219 Gastro-esophageal reflux disease without esophagitis: Secondary | ICD-10-CM | POA: Diagnosis not present

## 2022-03-29 DIAGNOSIS — E119 Type 2 diabetes mellitus without complications: Secondary | ICD-10-CM | POA: Diagnosis not present

## 2022-03-29 DIAGNOSIS — M48061 Spinal stenosis, lumbar region without neurogenic claudication: Secondary | ICD-10-CM | POA: Diagnosis not present

## 2022-03-29 LAB — BAYER DCA HB A1C WAIVED: HB A1C (BAYER DCA - WAIVED): 5.4 % (ref 4.8–5.6)

## 2022-03-29 MED ORDER — ATORVASTATIN CALCIUM 40 MG PO TABS: 40.0000 mg | ORAL_TABLET | Freq: Every day | ORAL | 3 refills | Status: DC

## 2022-03-29 MED ORDER — TESTOSTERONE CYPIONATE 200 MG/ML IM SOLN: 100.0000 mg | INTRAMUSCULAR | 0 refills | Status: DC

## 2022-03-29 MED ORDER — OMEPRAZOLE 40 MG PO CPDR: DELAYED_RELEASE_CAPSULE | ORAL | 3 refills | Status: DC

## 2022-03-29 MED ORDER — MELOXICAM 15 MG PO TABS: 15.0000 mg | ORAL_TABLET | ORAL | 3 refills | Status: DC

## 2022-03-29 NOTE — Progress Notes (Signed)
Subjective:  Patient ID: Cesar Leon, male    DOB: 08/23/1960  Age: 61 y.o. MRN: [id number]  CC: Medical Management of Chronic Issues   HPI Cesar Leon presents for Patient in for follow-up of GERD. Currently asymptomatic taking  PPI daily. There is no chest pain or heartburn. No hematemesis and no melena. No dysphagia or choking. Onset is remote. Progression is stable. Complicating factors, none. Taking meloxicam for lumbar spinal stenosis pain. Has to use PPI to prevent PUD & treat reflux.  Last testosterone shot 6 days ago. Doing well no edema. Ehnergy good. Had disability hearing. Waiting for an answer. Can no longer work due to back pain.    in for follow-up of elevated cholesterol. Doing well without complaints on current medication. Denies side effects of statin including myalgia and arthralgia and nausea. Currently no chest pain, shortness of breath or other cardiovascular related symptoms noted.   Using singulair prn since allergies are mild. Using the allegra D 24 and the nasal spray daily.     03/29/2022   11:14 AM 09/21/2021    7:48 AM 03/26/2021    8:56 AM  Depression screen PHQ 2/9  Decreased Interest 0 0 0  Down, Depressed, Hopeless 0 0 0  PHQ - 2 Score 0 0 0    History Cesar Leon has a past medical history of Allergic rhinitis, Chronic pain, DDD (degenerative disc disease) (12/28/08), Femur fracture, right ([facility]), GERD (gastroesophageal reflux disease), Hematuria, History of GI bleed, Hyperlipidemia, Hypogonadism male, Hypothyroidism, Internal hemorrhoid, Opioid type dependence, continuous ([facility]) (09/28/2015), and PVD (peripheral vascular disease) ([facility]).   He has a past surgical history that includes Carpal tunnel release (8/11) and right ankle  (1976).   His family history includes Anxiety disorder in his father; Depression in his father; Diabetes in his brother and father; Lung cancer in his father; Peripheral vascular disease in his father.He reports that he quit  smoking about 27 years ago. His smoking use included cigarettes. He has a 30.00 pack-year smoking history. He has never used smokeless tobacco. He reports current alcohol use. He reports that he does not use drugs.    ROS Review of Systems  Constitutional:  Negative for fever.  Respiratory:  Negative for shortness of breath.   Cardiovascular:  Negative for chest pain.  Musculoskeletal:  Negative for arthralgias.  Skin:  Negative for rash.    Objective:  BP 119/74   Pulse 88   Temp 98.3 F (36.8 C)   Ht 5\' 8"  (1.727 m)   Wt 179 lb 3.2 oz (81.3 kg)   SpO2 98%   BMI 27.25 kg/m   BP Readings from Last 3 Encounters:  03/29/22 119/74  09/21/21 113/69  03/26/21 132/83    Wt Readings from Last 3 Encounters:  03/29/22 179 lb 3.2 oz (81.3 kg)  09/21/21 179 lb 3.2 oz (81.3 kg)  03/26/21 175 lb (79.4 kg)     Physical Exam Vitals reviewed.  Constitutional:      Appearance: He is well-developed.  HENT:     Head: Normocephalic and atraumatic.     Right Ear: External ear normal.     Left Ear: External ear normal.     Mouth/Throat:     Pharynx: No oropharyngeal exudate or posterior oropharyngeal erythema.  Eyes:     Pupils: Pupils are equal, round, and reactive to light.  Cardiovascular:     Rate and Rhythm: Normal rate and regular rhythm.     Heart sounds: No murmur heard.  Pulmonary:     Effort: No respiratory distress.     Breath sounds: Normal breath sounds.  Musculoskeletal:     Cervical back: Normal range of motion and neck supple.  Neurological:     Mental Status: He is alert and oriented to person, place, and time.       Assessment & Plan:   Cesar Leon was seen today for medical management of chronic issues.  Diagnoses and all orders for this visit:  Hypogonadism male -     CBC with Differential/Platelet -     CMP14+EGFR  Gastroesophageal reflux disease without esophagitis -     omeprazole (PRILOSEC) 40 MG capsule; TAKE ONE (1) CAPSULE EACH DAY -      Testosterone,Free and Total -     CBC with Differential/Platelet -     CMP14+EGFR  Spinal stenosis of lumbar region, unspecified whether neurogenic claudication present -     CBC with Differential/Platelet -     CMP14+EGFR  Spondylolisthesis at L4-L5 level -     CBC with Differential/Platelet -     CMP14+EGFR  Diabetes mellitus without complication ([facility]) -     Bayer DCA Hb A1c Waived  Mixed hyperlipidemia -     Lipid panel  Other orders -     atorvastatin (LIPITOR) 40 MG tablet; Take 1 tablet (40 mg total) by mouth daily. TAKE ONE (1) TABLET EACH DAY -     meloxicam (MOBIC) 15 MG tablet; Take 1 tablet (15 mg total) by mouth every other day. -     testosterone cypionate (DEPOTESTOSTERONE CYPIONATE) 200 MG/ML injection; Inject 0.5 mLs (100 mg total) into the muscle every 14 (fourteen) days.       I have changed Cesar Leon's omeprazole, atorvastatin, and testosterone cypionate. I am also having him maintain his Calcium Carb-Cholecalciferol (CALCIUM + D3 PO), Multiple Vitamins-Minerals (CENTRUM SILVER PO), fexofenadine-pseudoephedrine, fluticasone, montelukast, polyethylene glycol powder, and meloxicam. We will stop administering testosterone cypionate.  Allergies as of 03/29/2022       Reactions   Metformin And Related Nausea And Vomiting   Codeine    Penicillins         Medication List        Accurate as of March 29, 2022 11:45 AM. If you have any questions, ask your nurse or doctor.          atorvastatin 40 MG tablet Commonly known as: LIPITOR Take 1 tablet (40 mg total) by mouth daily. TAKE ONE (1) TABLET EACH DAY What changed: See the new instructions. Changed by: Claretta Fraise, MD   CALCIUM + D3 PO Take by mouth.   CENTRUM SILVER PO Take 1 tablet by mouth daily.   fexofenadine-pseudoephedrine 180-240 MG 24 hr tablet Commonly known as: ALLEGRA-D 24 Take 1 tablet by mouth every evening. For allergy and congestion   fluticasone 50 MCG/ACT nasal  spray Commonly known as: FLONASE Place 2 sprays into both nostrils daily.   meloxicam 15 MG tablet Commonly known as: MOBIC Take 1 tablet (15 mg total) by mouth every other day.   montelukast 10 MG tablet Commonly known as: SINGULAIR Take 1 tablet (10 mg total) by mouth at bedtime.   omeprazole 40 MG capsule Commonly known as: PRILOSEC TAKE ONE (1) CAPSULE EACH DAY What changed: additional instructions Changed by: Claretta Fraise, MD   polyethylene glycol powder 17 GM/SCOOP powder Commonly known as: GLYCOLAX/MIRALAX MIX 17 GRAMS INTO 8OZ OF WATER AND DRINK DAILY   testosterone cypionate 200 MG/ML injection Commonly known  as: DEPOTESTOSTERONE CYPIONATE Inject 0.5 mLs (100 mg total) into the muscle every 14 (fourteen) days. What changed: See the new instructions. Changed by: Claretta Fraise, MD         Follow-up: Return in about 6 months (around 09/28/2022).  Claretta Fraise, M.D.

## 2022-04-03 LAB — CBC WITH DIFFERENTIAL/PLATELET
Basophils Absolute: 0 10*3/uL (ref 0.0–0.2)
Basos: 1 %
EOS (ABSOLUTE): 0.1 10*3/uL (ref 0.0–0.4)
Eos: 3 %
Hematocrit: 45.4 % (ref 37.5–51.0)
Hemoglobin: 15.5 g/dL (ref 13.0–17.7)
Immature Grans (Abs): 0 10*3/uL (ref 0.0–0.1)
Immature Granulocytes: 0 %
Lymphocytes Absolute: 1.3 10*3/uL (ref 0.7–3.1)
Lymphs: 33 %
MCH: 30.8 pg (ref 26.6–33.0)
MCHC: 34.1 g/dL (ref 31.5–35.7)
MCV: 90 fL (ref 79–97)
Monocytes Absolute: 0.4 10*3/uL (ref 0.1–0.9)
Monocytes: 10 %
Neutrophils Absolute: 2.2 10*3/uL (ref 1.4–7.0)
Neutrophils: 53 %
Platelets: 271 10*3/uL (ref 150–450)
RBC: 5.03 x10E6/uL (ref 4.14–5.80)
RDW: 11.8 % (ref 11.6–15.4)
WBC: 4 10*3/uL (ref 3.4–10.8)

## 2022-04-03 LAB — LIPID PANEL
Chol/HDL Ratio: 2.5 ratio (ref 0.0–5.0)
Cholesterol, Total: 136 mg/dL (ref 100–199)
HDL: 55 mg/dL (ref >39)
LDL Chol Calc (NIH): 65 mg/dL (ref 0–99)
Triglycerides: 86 mg/dL (ref 0–149)
VLDL Cholesterol Cal: 16 mg/dL (ref 5–40)

## 2022-04-03 LAB — CMP14+EGFR
ALT: 20 IU/L (ref 0–44)
AST: 19 IU/L (ref 0–40)
Albumin/Globulin Ratio: 1.9 (ref 1.2–2.2)
Albumin: 4.8 g/dL (ref 3.9–4.9)
Alkaline Phosphatase: 73 IU/L (ref 44–121)
BUN/Creatinine Ratio: 14 (ref 10–24)
BUN: 15 mg/dL (ref 8–27)
Bilirubin Total: 0.6 mg/dL (ref 0.0–1.2)
CO2: 19 mmol/L — ABNORMAL LOW (ref 20–29)
Calcium: 9.7 mg/dL (ref 8.6–10.2)
Chloride: 99 mmol/L (ref 96–106)
Creatinine, Ser: 1.05 mg/dL (ref 0.76–1.27)
Globulin, Total: 2.5 g/dL (ref 1.5–4.5)
Glucose: 110 mg/dL — ABNORMAL HIGH (ref 70–99)
Potassium: 4.5 mmol/L (ref 3.5–5.2)
Sodium: 143 mmol/L (ref 134–144)
Total Protein: 7.3 g/dL (ref 6.0–8.5)
eGFR: 80 mL/min/{1.73_m2} (ref >59)

## 2022-04-03 LAB — TESTOSTERONE,FREE AND TOTAL
Testosterone, Free: 8.9 pg/mL (ref 6.6–18.1)
Testosterone: 640 ng/dL (ref 264–916)

## 2022-04-04 NOTE — Progress Notes (Signed)
Hello Cesar Leon,  Your lab result is normal and/or stable.Some minor variations that are not significant are commonly marked abnormal, but do not represent any medical problem for you.  Best regards, Laquandra Carrillo, M.D.

## 2022-04-06 ENCOUNTER — Ambulatory Visit (INDEPENDENT_AMBULATORY_CARE_PROVIDER_SITE_OTHER): Payer: Medicare Other | Admitting: *Deleted

## 2022-04-06 DIAGNOSIS — E291 Testicular hypofunction: Secondary | ICD-10-CM | POA: Diagnosis not present

## 2022-04-06 MED ORDER — TESTOSTERONE CYPIONATE 200 MG/ML IM SOLN
100.0000 mg | INTRAMUSCULAR | Status: DC
  Administered 2022-04-06 – 2022-07-13 (×8): 100 mg via INTRAMUSCULAR

## 2022-04-20 ENCOUNTER — Ambulatory Visit (INDEPENDENT_AMBULATORY_CARE_PROVIDER_SITE_OTHER): Payer: Medicare Other | Admitting: *Deleted

## 2022-04-20 DIAGNOSIS — E291 Testicular hypofunction: Secondary | ICD-10-CM

## 2022-04-20 DIAGNOSIS — E349 Endocrine disorder, unspecified: Secondary | ICD-10-CM

## 2022-05-04 ENCOUNTER — Ambulatory Visit (INDEPENDENT_AMBULATORY_CARE_PROVIDER_SITE_OTHER): Payer: Medicare Other

## 2022-05-04 DIAGNOSIS — E291 Testicular hypofunction: Secondary | ICD-10-CM

## 2022-05-04 NOTE — Progress Notes (Signed)
Testosterone injection given to right upper outer quadrant.  Patient tolerated well. 

## 2022-05-18 ENCOUNTER — Ambulatory Visit (INDEPENDENT_AMBULATORY_CARE_PROVIDER_SITE_OTHER): Payer: Medicare Other

## 2022-05-18 DIAGNOSIS — E291 Testicular hypofunction: Secondary | ICD-10-CM

## 2022-06-01 ENCOUNTER — Ambulatory Visit (INDEPENDENT_AMBULATORY_CARE_PROVIDER_SITE_OTHER): Payer: Medicare Other

## 2022-06-01 DIAGNOSIS — E291 Testicular hypofunction: Secondary | ICD-10-CM

## 2022-06-01 NOTE — Progress Notes (Signed)
Testosterone injection given to right upper outer quadrant.  Patient tolerated well. 

## 2022-06-15 ENCOUNTER — Ambulatory Visit (INDEPENDENT_AMBULATORY_CARE_PROVIDER_SITE_OTHER): Payer: Medicare Other

## 2022-06-15 DIAGNOSIS — E291 Testicular hypofunction: Secondary | ICD-10-CM

## 2022-06-15 NOTE — Progress Notes (Signed)
Testosterone injection given to left upper outer quadrant.  Patient tolerated well. 

## 2022-06-29 ENCOUNTER — Ambulatory Visit (INDEPENDENT_AMBULATORY_CARE_PROVIDER_SITE_OTHER): Payer: Medicare Other | Admitting: *Deleted

## 2022-06-29 DIAGNOSIS — E291 Testicular hypofunction: Secondary | ICD-10-CM

## 2022-06-29 DIAGNOSIS — E349 Endocrine disorder, unspecified: Secondary | ICD-10-CM

## 2022-06-29 NOTE — Progress Notes (Signed)
Testosterone injection given, right upper outer quadrant, intramuscular, 0.5 mL/100 mg.  Patient tolerated well

## 2022-07-13 ENCOUNTER — Ambulatory Visit (INDEPENDENT_AMBULATORY_CARE_PROVIDER_SITE_OTHER): Payer: Medicare Other | Admitting: *Deleted

## 2022-07-13 DIAGNOSIS — E291 Testicular hypofunction: Secondary | ICD-10-CM

## 2022-07-13 DIAGNOSIS — E349 Endocrine disorder, unspecified: Secondary | ICD-10-CM

## 2022-07-13 NOTE — Progress Notes (Signed)
Testosterone injection given left upper outer quadrant, intramuscular.  Patient tolerated well 

## 2022-07-17 ENCOUNTER — Other Ambulatory Visit: Payer: Self-pay | Admitting: Family Medicine

## 2022-07-17 DIAGNOSIS — J301 Allergic rhinitis due to pollen: Secondary | ICD-10-CM

## 2022-07-27 ENCOUNTER — Ambulatory Visit (INDEPENDENT_AMBULATORY_CARE_PROVIDER_SITE_OTHER): Payer: Medicare Other | Admitting: *Deleted

## 2022-07-27 DIAGNOSIS — E291 Testicular hypofunction: Secondary | ICD-10-CM

## 2022-07-27 MED ORDER — TESTOSTERONE CYPIONATE 200 MG/ML IM SOLN
100.0000 mg | INTRAMUSCULAR | Status: DC
  Administered 2022-07-27 – 2023-06-14 (×23): 100 mg via INTRAMUSCULAR
  Administered 2023-06-29: 200 mg via INTRAMUSCULAR
  Administered 2023-07-12 – 2023-10-04 (×7): 100 mg via INTRAMUSCULAR

## 2022-07-27 NOTE — Progress Notes (Signed)
Testosterone injection given, right upper outer quadrant, intramuscular. Patient tolerated well. 

## 2022-08-10 ENCOUNTER — Ambulatory Visit (INDEPENDENT_AMBULATORY_CARE_PROVIDER_SITE_OTHER): Payer: Medicare Other

## 2022-08-10 DIAGNOSIS — E291 Testicular hypofunction: Secondary | ICD-10-CM | POA: Diagnosis not present

## 2022-08-10 NOTE — Progress Notes (Signed)
Testosterone injection given to left upper outer quadrant.  Patient tolerated well.

## 2022-08-24 ENCOUNTER — Ambulatory Visit (INDEPENDENT_AMBULATORY_CARE_PROVIDER_SITE_OTHER): Payer: Medicare Other | Admitting: Family Medicine

## 2022-08-24 DIAGNOSIS — E291 Testicular hypofunction: Secondary | ICD-10-CM

## 2022-09-07 ENCOUNTER — Ambulatory Visit (INDEPENDENT_AMBULATORY_CARE_PROVIDER_SITE_OTHER): Payer: Medicare Other

## 2022-09-07 DIAGNOSIS — E291 Testicular hypofunction: Secondary | ICD-10-CM | POA: Diagnosis not present

## 2022-09-07 NOTE — Progress Notes (Signed)
Testosterone injection given to left upper outer quadrant.  Patient tolerated well. 

## 2022-09-21 ENCOUNTER — Ambulatory Visit (INDEPENDENT_AMBULATORY_CARE_PROVIDER_SITE_OTHER): Payer: Medicare Other

## 2022-09-21 DIAGNOSIS — E291 Testicular hypofunction: Secondary | ICD-10-CM

## 2022-09-21 NOTE — Progress Notes (Signed)
Testosterone injection given to right upper outer quadrant.  Patient tolerated well. 

## 2022-09-28 ENCOUNTER — Ambulatory Visit: Payer: Medicare HMO | Admitting: Family Medicine

## 2022-09-28 ENCOUNTER — Encounter: Payer: Self-pay | Admitting: Family Medicine

## 2022-09-28 VITALS — BP 110/72 | HR 90 | Temp 98.3°F | Ht 68.0 in | Wt 172.4 lb

## 2022-09-28 DIAGNOSIS — E119 Type 2 diabetes mellitus without complications: Secondary | ICD-10-CM

## 2022-09-28 DIAGNOSIS — E349 Endocrine disorder, unspecified: Secondary | ICD-10-CM | POA: Diagnosis not present

## 2022-09-28 DIAGNOSIS — E291 Testicular hypofunction: Secondary | ICD-10-CM | POA: Diagnosis not present

## 2022-09-28 DIAGNOSIS — K219 Gastro-esophageal reflux disease without esophagitis: Secondary | ICD-10-CM | POA: Diagnosis not present

## 2022-09-28 DIAGNOSIS — J301 Allergic rhinitis due to pollen: Secondary | ICD-10-CM | POA: Diagnosis not present

## 2022-09-28 DIAGNOSIS — E782 Mixed hyperlipidemia: Secondary | ICD-10-CM

## 2022-09-28 LAB — LIPID PANEL

## 2022-09-28 LAB — BAYER DCA HB A1C WAIVED: HB A1C (BAYER DCA - WAIVED): 5.3 % (ref 4.8–5.6)

## 2022-09-28 MED ORDER — MELOXICAM 15 MG PO TABS: 15.0000 mg | ORAL_TABLET | ORAL | 3 refills | Status: DC

## 2022-09-28 MED ORDER — TESTOSTERONE CYPIONATE 200 MG/ML IM SOLN: INTRAMUSCULAR | 1 refills | Status: DC

## 2022-09-28 MED ORDER — POLYETHYLENE GLYCOL 3350 17 GM/SCOOP PO POWD: ORAL | 1 refills | Status: AC

## 2022-09-28 MED ORDER — OMEPRAZOLE 40 MG PO CPDR
DELAYED_RELEASE_CAPSULE | ORAL | 3 refills | Status: DC
Start: 2022-09-28 — End: 2023-09-18

## 2022-09-28 MED ORDER — ATORVASTATIN CALCIUM 40 MG PO TABS: 40.0000 mg | ORAL_TABLET | Freq: Every day | ORAL | 3 refills | Status: DC

## 2022-09-28 MED ORDER — FEXOFENADINE-PSEUDOEPHED ER 180-240 MG PO TB24: 1.0000 | ORAL_TABLET | Freq: Every evening | ORAL | 11 refills | Status: DC

## 2022-09-28 NOTE — Progress Notes (Signed)
Subjective:  Patient ID: Cesar Leon, male    DOB: 28-Dec-1960  Age: 62 y.o. MRN: [id number]  CC: Medical Management of Chronic Issues   HPI TAHJAY LEEMAN presents forFollow-up of diabetes. No longer considered diabetic. Patient denies symptoms such as polyuria, polydipsia, excessive hunger, nausea No significant hypoglycemic spells noted. Medications reviewed. Pt reports taking them regularly without complication/adverse reaction being reported today.  Now fully disabled due to back pain, limited function due to failed back. Taking 1 gm tylenol BID for adequate pain relief.  Taking testosterone shots every 2 weeks. Feels fine. Last injection was 7 days ago.   History [name] has a past medical history of Allergic rhinitis, Chronic pain, DDD (degenerative disc disease) (12/28/08), Femur fracture, right, GERD (gastroesophageal reflux disease), Hematuria, History of GI bleed, Hyperlipidemia, Hypogonadism male, Hypothyroidism, Internal hemorrhoid, Opioid type dependence, continuous (09/28/2015), and PVD (peripheral vascular disease).   He has a past surgical history that includes Carpal tunnel release (8/11) and right ankle  (1976).   His family history includes Anxiety disorder in his father; Depression in his father; Diabetes in his brother and father; Lung cancer in his father; Peripheral vascular disease in his father.He reports that he quit smoking about 28 years ago. His smoking use included cigarettes. He has a 30.00 pack-year smoking history. He has never used smokeless tobacco. He reports current alcohol use. He reports that he does not use drugs.  Current Outpatient Medications on File Prior to Visit  Medication Sig Dispense Refill   Calcium Carb-Cholecalciferol (CALCIUM + D3 PO) Take by mouth.     fluticasone (FLONASE) 50 MCG/ACT nasal spray USE 2 SPRAYS IN EACH NOSTRIL DAILY 48 g 1   montelukast (SINGULAIR) 10 MG tablet Take 1 tablet (10 mg total) by mouth at bedtime. 90  tablet 3   Multiple Vitamins-Minerals (CENTRUM SILVER PO) Take 1 tablet by mouth daily.     Current Facility-Administered Medications on File Prior to Visit  Medication Dose Route Frequency Provider Last Rate Last Admin   testosterone cypionate (DEPOTESTOSTERONE CYPIONATE) injection 100 mg  100 mg Intramuscular Q14 Days Claretta Fraise, MD   100 mg at 09/21/22 [id number]    ROS Review of Systems  Constitutional:  Negative for fever.  Respiratory:  Negative for shortness of breath.   Cardiovascular:  Negative for chest pain.  Musculoskeletal:  Positive for arthralgias (left knee pain - hard to walk. Has been told he needs replacement).  Skin:  Negative for rash.    Objective:  BP 110/72   Pulse 90   Temp 98.3 F (36.8 C)   Ht 5\' 8"  (1.727 m)   Wt 172 lb 6.4 oz (78.2 kg)   SpO2 98%   BMI 26.21 kg/m   BP Readings from Last 3 Encounters:  09/28/22 110/72  03/29/22 119/74  09/21/21 113/69    Wt Readings from Last 3 Encounters:  09/28/22 172 lb 6.4 oz (78.2 kg)  03/29/22 179 lb 3.2 oz (81.3 kg)  09/21/21 179 lb 3.2 oz (81.3 kg)     Physical Exam Vitals reviewed.  Constitutional:      Appearance: He is well-developed.  HENT:     Head: Normocephalic and atraumatic.     Right Ear: External ear normal.     Left Ear: External ear normal.     Mouth/Throat:     Pharynx: No oropharyngeal exudate or posterior oropharyngeal erythema.  Eyes:     Pupils: Pupils are equal, round, and reactive to light.  Cardiovascular:  Rate and Rhythm: Normal rate and regular rhythm.     Heart sounds: No murmur heard. Pulmonary:     Effort: No respiratory distress.     Breath sounds: Normal breath sounds.  Musculoskeletal:     Cervical back: Normal range of motion and neck supple.  Neurological:     Mental Status: He is alert and oriented to person, place, and time.       Assessment & Plan:   Eylan was seen today for medical management of chronic issues.  Diagnoses and all orders  for this visit:  Testosterone deficiency -     Testosterone,Free and Total  Diabetes mellitus without complication -     Bayer DCA Hb A1c Waived -     CBC with Differential/Platelet -     CMP14+EGFR -     Microalbumin / creatinine urine ratio  Mixed hyperlipidemia -     Lipid panel  Seasonal allergic rhinitis due to pollen -     fexofenadine-pseudoephedrine (ALLEGRA-D 24) 180-240 MG 24 hr tablet; Take 1 tablet by mouth every evening. For allergy and congestion  Gastroesophageal reflux disease without esophagitis -     omeprazole (PRILOSEC) 40 MG capsule; TAKE ONE (1) CAPSULE EACH DAY  Other orders -     atorvastatin (LIPITOR) 40 MG tablet; Take 1 tablet (40 mg total) by mouth daily. TAKE ONE (1) TABLET EACH DAY -     meloxicam (MOBIC) 15 MG tablet; Take 1 tablet (15 mg total) by mouth every other day. -     polyethylene glycol powder (GLYCOLAX/MIRALAX) 17 GM/SCOOP powder; MIX 17 GRAMS INTO 8OZ OF WATER AND DRINK DAILY -     testosterone cypionate (DEPOTESTOSTERONE CYPIONATE) 200 MG/ML injection; INJECT 0.5ML INTO THE MUSCLE EVERY 14 DAYS      I am having Blane Pask. Shahan maintain his Calcium Carb-Cholecalciferol (CALCIUM + D3 PO), Multiple Vitamins-Minerals (CENTRUM SILVER PO), montelukast, fluticasone, atorvastatin, fexofenadine-pseudoephedrine, meloxicam, omeprazole, polyethylene glycol powder, and testosterone cypionate. We will continue to administer testosterone cypionate.  Meds ordered this encounter  Medications   atorvastatin (LIPITOR) 40 MG tablet    Sig: Take 1 tablet (40 mg total) by mouth daily. TAKE ONE (1) TABLET EACH DAY    Dispense:  90 tablet    Refill:  3   fexofenadine-pseudoephedrine (ALLEGRA-D 24) 180-240 MG 24 hr tablet    Sig: Take 1 tablet by mouth every evening. For allergy and congestion    Dispense:  30 tablet    Refill:  11   meloxicam (MOBIC) 15 MG tablet    Sig: Take 1 tablet (15 mg total) by mouth every other day.    Dispense:  45 tablet     Refill:  3   omeprazole (PRILOSEC) 40 MG capsule    Sig: TAKE ONE (1) CAPSULE EACH DAY    Dispense:  90 capsule    Refill:  3   polyethylene glycol powder (GLYCOLAX/MIRALAX) 17 GM/SCOOP powder    Sig: MIX 17 GRAMS INTO 8OZ OF WATER AND DRINK DAILY    Dispense:  510 g    Refill:  1   testosterone cypionate (DEPOTESTOSTERONE CYPIONATE) 200 MG/ML injection    Sig: INJECT 0.5ML INTO THE MUSCLE EVERY 14 DAYS    Dispense:  10 mL    Refill:  1     Follow-up: Return in about 6 months (around 03/30/2023).  Claretta Fraise, M.D.

## 2022-09-29 LAB — MICROALBUMIN / CREATININE URINE RATIO
Creatinine, Urine: 13.1 mg/dL
Microalb/Creat Ratio: <23 mg/g creat (ref 0–29)
Microalbumin, Urine: <3 ug/mL

## 2022-09-29 NOTE — Progress Notes (Signed)
Hello Antonin,  Your lab result is normal and/or stable.Some minor variations that are not significant are commonly marked abnormal, but do not represent any medical problem for you.  Best regards, Staci Dack, M.D.

## 2022-09-30 LAB — CBC WITH DIFFERENTIAL/PLATELET
Basophils Absolute: 0 10*3/uL (ref 0.0–0.2)
Basos: 1 %
EOS (ABSOLUTE): 0.1 10*3/uL (ref 0.0–0.4)
Eos: 3 %
Hematocrit: 45.2 % (ref 37.5–51.0)
Hemoglobin: 15.2 g/dL (ref 13.0–17.7)
Immature Grans (Abs): 0 10*3/uL (ref 0.0–0.1)
Immature Granulocytes: 0 %
Lymphocytes Absolute: 1.3 10*3/uL (ref 0.7–3.1)
Lymphs: 31 %
MCH: 31.1 pg (ref 26.6–33.0)
MCHC: 33.6 g/dL (ref 31.5–35.7)
MCV: 92 fL (ref 79–97)
Monocytes Absolute: 0.4 10*3/uL (ref 0.1–0.9)
Monocytes: 9 %
Neutrophils Absolute: 2.3 10*3/uL (ref 1.4–7.0)
Neutrophils: 56 %
Platelets: 282 10*3/uL (ref 150–450)
RBC: 4.89 x10E6/uL (ref 4.14–5.80)
RDW: 11.8 % (ref 11.6–15.4)
WBC: 4.1 10*3/uL (ref 3.4–10.8)

## 2022-09-30 LAB — CMP14+EGFR
ALT: 25 IU/L (ref 0–44)
AST: 24 IU/L (ref 0–40)
Albumin/Globulin Ratio: 2.3 — ABNORMAL HIGH (ref 1.2–2.2)
Albumin: 4.6 g/dL (ref 3.9–4.9)
Alkaline Phosphatase: 81 IU/L (ref 44–121)
BUN/Creatinine Ratio: 17 (ref 10–24)
BUN: 16 mg/dL (ref 8–27)
Bilirubin Total: 0.8 mg/dL (ref 0.0–1.2)
CO2: 26 mmol/L (ref 20–29)
Calcium: 9.6 mg/dL (ref 8.6–10.2)
Chloride: 98 mmol/L (ref 96–106)
Creatinine, Ser: 0.96 mg/dL (ref 0.76–1.27)
Globulin, Total: 2 g/dL (ref 1.5–4.5)
Glucose: 116 mg/dL — ABNORMAL HIGH (ref 70–99)
Potassium: 4.2 mmol/L (ref 3.5–5.2)
Sodium: 139 mmol/L (ref 134–144)
Total Protein: 6.6 g/dL (ref 6.0–8.5)
eGFR: 89 mL/min/{1.73_m2} (ref >59)

## 2022-09-30 LAB — LIPID PANEL
Chol/HDL Ratio: 2.3 ratio (ref 0.0–5.0)
Cholesterol, Total: 130 mg/dL (ref 100–199)
HDL: 56 mg/dL (ref >39)
LDL Chol Calc (NIH): 61 mg/dL (ref 0–99)
Triglycerides: 59 mg/dL (ref 0–149)
VLDL Cholesterol Cal: 13 mg/dL (ref 5–40)

## 2022-09-30 LAB — TESTOSTERONE,FREE AND TOTAL
Testosterone, Free: 8.9 pg/mL (ref 6.6–18.1)
Testosterone: 606 ng/dL (ref 264–916)

## 2022-10-05 ENCOUNTER — Ambulatory Visit: Payer: Federal, State, Local not specified - PPO

## 2022-10-19 ENCOUNTER — Ambulatory Visit (INDEPENDENT_AMBULATORY_CARE_PROVIDER_SITE_OTHER): Payer: Medicare HMO | Admitting: Family Medicine

## 2022-10-19 DIAGNOSIS — E291 Testicular hypofunction: Secondary | ICD-10-CM | POA: Diagnosis not present

## 2022-10-19 DIAGNOSIS — E349 Endocrine disorder, unspecified: Secondary | ICD-10-CM

## 2022-11-02 ENCOUNTER — Ambulatory Visit (INDEPENDENT_AMBULATORY_CARE_PROVIDER_SITE_OTHER): Payer: Medicare HMO | Admitting: *Deleted

## 2022-11-02 DIAGNOSIS — E291 Testicular hypofunction: Secondary | ICD-10-CM

## 2022-11-02 DIAGNOSIS — E349 Endocrine disorder, unspecified: Secondary | ICD-10-CM

## 2022-11-02 NOTE — Progress Notes (Signed)
Pt in today for testosterone injection, right upper outer quad, tolerated well.

## 2022-11-16 ENCOUNTER — Ambulatory Visit: Payer: Medicare HMO

## 2022-11-16 DIAGNOSIS — E349 Endocrine disorder, unspecified: Secondary | ICD-10-CM

## 2022-11-16 DIAGNOSIS — E291 Testicular hypofunction: Secondary | ICD-10-CM | POA: Diagnosis not present

## 2022-11-16 NOTE — Progress Notes (Signed)
Pt given 100mg  Testosterone injection IM Left glut without difficulty.

## 2022-11-30 ENCOUNTER — Ambulatory Visit (INDEPENDENT_AMBULATORY_CARE_PROVIDER_SITE_OTHER): Payer: Medicare HMO

## 2022-11-30 DIAGNOSIS — E291 Testicular hypofunction: Secondary | ICD-10-CM

## 2022-11-30 DIAGNOSIS — E349 Endocrine disorder, unspecified: Secondary | ICD-10-CM

## 2022-11-30 NOTE — Progress Notes (Signed)
Testosterone injection given to left upper outer quadrant.  Patient tolerated well. 

## 2022-12-14 ENCOUNTER — Ambulatory Visit (INDEPENDENT_AMBULATORY_CARE_PROVIDER_SITE_OTHER): Payer: Medicare HMO | Admitting: *Deleted

## 2022-12-14 DIAGNOSIS — E291 Testicular hypofunction: Secondary | ICD-10-CM | POA: Diagnosis not present

## 2022-12-14 NOTE — Progress Notes (Signed)
Testosterone injection given intramuscular right upper outer quadrant. Patient tolerated well.

## 2022-12-28 ENCOUNTER — Ambulatory Visit (INDEPENDENT_AMBULATORY_CARE_PROVIDER_SITE_OTHER): Payer: Medicare HMO

## 2022-12-28 DIAGNOSIS — E291 Testicular hypofunction: Secondary | ICD-10-CM | POA: Diagnosis not present

## 2022-12-28 DIAGNOSIS — E349 Endocrine disorder, unspecified: Secondary | ICD-10-CM

## 2022-12-28 NOTE — Progress Notes (Signed)
Testosterone injection given to left deltoid.  Patient tolerated well.

## 2023-01-04 ENCOUNTER — Ambulatory Visit: Payer: Medicare HMO | Admitting: Family Medicine

## 2023-01-11 ENCOUNTER — Ambulatory Visit: Payer: Medicare HMO

## 2023-01-11 DIAGNOSIS — E291 Testicular hypofunction: Secondary | ICD-10-CM

## 2023-01-11 DIAGNOSIS — E349 Endocrine disorder, unspecified: Secondary | ICD-10-CM

## 2023-01-11 NOTE — Progress Notes (Signed)
Testerone injection given right upper outer gult patient tolerated well

## 2023-01-12 ENCOUNTER — Encounter: Payer: Self-pay | Admitting: Family Medicine

## 2023-01-12 ENCOUNTER — Ambulatory Visit (INDEPENDENT_AMBULATORY_CARE_PROVIDER_SITE_OTHER): Payer: Medicare HMO | Admitting: Family Medicine

## 2023-01-12 VITALS — BP 134/66 | HR 85 | Temp 98.1°F | Ht 68.0 in | Wt 179.6 lb

## 2023-01-12 DIAGNOSIS — I4510 Unspecified right bundle-branch block: Secondary | ICD-10-CM

## 2023-01-12 DIAGNOSIS — R350 Frequency of micturition: Secondary | ICD-10-CM

## 2023-01-12 DIAGNOSIS — N401 Enlarged prostate with lower urinary tract symptoms: Secondary | ICD-10-CM | POA: Diagnosis not present

## 2023-01-12 DIAGNOSIS — Z0001 Encounter for general adult medical examination with abnormal findings: Secondary | ICD-10-CM | POA: Diagnosis not present

## 2023-01-12 DIAGNOSIS — Z23 Encounter for immunization: Secondary | ICD-10-CM

## 2023-01-12 DIAGNOSIS — Z0000 Encounter for general adult medical examination without abnormal findings: Secondary | ICD-10-CM

## 2023-01-12 NOTE — Progress Notes (Unsigned)
Annual Wellness Visit     Patient: Cesar Leon, Male    DOB: January 22, 1961, 62 y.o.   MRN: [id number]  Subjective  Chief Complaint  Patient presents with   WELCOME TO MEDICARE    Cesar Leon is a 62 y.o. male who presents today for his Annual Wellness Visit. He reports consuming a general diet. Exercise is limited by back pain. He generally feels well. He reports sleeping well. He does not have additional problems to discuss today.   HPI  Vision:Not within last year , Dental: No current dental problems, and PSA: Agrees to PSA testing   Patient Active Problem List   Diagnosis Date Noted   Right bundle branch block 01/14/2023   Postlaminectomy syndrome 11/06/2019   Pain in left knee 03/01/2018   Osteoarthritis of left knee 03/01/2018   Spondylolisthesis at L4-L5 level 01/15/2018   Gastroesophageal reflux disease without esophagitis 08/05/2015   Spinal stenosis of lumbar region 04/29/2015   Hypogonadism male 09/22/2014   Hyperlipemia 09/22/2014   Past Medical History:  Diagnosis Date   Allergic rhinitis    Chronic pain    DDD (degenerative disc disease) 12/28/08   Femur fracture, right (HCC)    GERD (gastroesophageal reflux disease)    Hematuria    History of GI bleed    Hyperlipidemia    Hypogonadism male    Hypothyroidism    Internal hemorrhoid    Opioid type dependence, continuous (HCC) 09/28/2015   PVD (peripheral vascular disease) (HCC)    Past Surgical History:  Procedure Laterality Date   CARPAL TUNNEL RELEASE  8/11   right ankle   1976   Family History  Problem Relation Age of Onset   Cancer Mother        genital   Cancer Father        lung   Lung cancer Father        smoker    Anxiety disorder Father    Depression Father    Diabetes Father    Peripheral vascular disease Father    Diabetes Brother       Medications: Outpatient Medications Prior to Visit  Medication Sig   atorvastatin (LIPITOR) 40 MG tablet Take 1 tablet (40 mg total)  by mouth daily. TAKE ONE (1) TABLET EACH DAY   Calcium Carb-Cholecalciferol (CALCIUM + D3 PO) Take by mouth.   fexofenadine-pseudoephedrine (ALLEGRA-D 24) 180-240 MG 24 hr tablet Take 1 tablet by mouth every evening. For allergy and congestion   fluticasone (FLONASE) 50 MCG/ACT nasal spray USE 2 SPRAYS IN EACH NOSTRIL DAILY   meloxicam (MOBIC) 15 MG tablet Take 1 tablet (15 mg total) by mouth every other day.   Multiple Vitamins-Minerals (CENTRUM SILVER PO) Take 1 tablet by mouth daily.   omeprazole (PRILOSEC) 40 MG capsule TAKE ONE (1) CAPSULE EACH DAY   polyethylene glycol powder (GLYCOLAX/MIRALAX) 17 GM/SCOOP powder MIX 17 GRAMS INTO 8OZ OF WATER AND DRINK DAILY   testosterone cypionate (DEPOTESTOSTERONE CYPIONATE) 200 MG/ML injection INJECT 0.5ML INTO THE MUSCLE EVERY 14 DAYS   [DISCONTINUED] montelukast (SINGULAIR) 10 MG tablet Take 1 tablet (10 mg total) by mouth at bedtime.   Facility-Administered Medications Prior to Visit  Medication Dose Route Frequency Provider   testosterone cypionate (DEPOTESTOSTERONE CYPIONATE) injection 100 mg  100 mg Intramuscular Q14 Days Mechele Claude, MD    Allergies  Allergen Reactions   Metformin And Related Nausea And Vomiting   Codeine    Penicillins     Patient Care Team:  Mechele Claude, MD as PCP - General (Family Medicine)  ROS      Objective  BP 134/66   Pulse 85   Temp 98.1 F (36.7 C)   Ht 5\' 8"  (1.727 m)   Wt 179 lb 9.6 oz (81.5 kg)   SpO2 97%   BMI 27.31 kg/m  BP Readings from Last 3 Encounters:  01/12/23 134/66  09/28/22 110/72  03/29/22 119/74   Wt Readings from Last 3 Encounters:  01/12/23 179 lb 9.6 oz (81.5 kg)  09/28/22 172 lb 6.4 oz (78.2 kg)  03/29/22 179 lb 3.2 oz (81.3 kg)      Physical Exam Constitutional:      General: He is not in acute distress.    Appearance: He is well-developed.  HENT:     Head: Normocephalic and atraumatic.     Right Ear: External ear normal.     Left Ear: External ear  normal.     Nose: Nose normal.  Eyes:     Conjunctiva/sclera: Conjunctivae normal.     Pupils: Pupils are equal, round, and reactive to light.  Cardiovascular:     Rate and Rhythm: Normal rate and regular rhythm.     Heart sounds: Normal heart sounds. No murmur heard. Pulmonary:     Effort: Pulmonary effort is normal. No respiratory distress.     Breath sounds: Normal breath sounds. No wheezing or rales.  Abdominal:     Palpations: Abdomen is soft.     Tenderness: There is no abdominal tenderness.  Musculoskeletal:        General: Normal range of motion.     Cervical back: Normal range of motion and neck supple.  Skin:    General: Skin is warm and dry.  Neurological:     Mental Status: He is alert and oriented to person, place, and time.     Deep Tendon Reflexes: Reflexes are normal and symmetric.  Psychiatric:        Behavior: Behavior normal.        Thought Content: Thought content normal.        Judgment: Judgment normal.       Most recent functional status assessment:    01/12/2023   10:30 AM  In your present state of health, do you have any difficulty performing the following activities:  Hearing? 0  Vision? 0  Difficulty concentrating or making decisions? 0  Walking or climbing stairs? 0  Dressing or bathing? 1  Comment uses a grabber  Doing errands, shopping? 0  Preparing Food and eating ? N  Using the Toilet? N  In the past six months, have you accidently leaked urine? N  Do you have problems with loss of bowel control? N  Managing your Medications? N  Managing your Finances? N  Housekeeping or managing your Housekeeping? N   Most recent fall risk assessment:    01/12/2023   10:29 AM  Fall Risk   Falls in the past year? 0    Most recent depression screenings:    01/12/2023   10:29 AM 01/12/2023   10:26 AM  PHQ 2/9 Scores  PHQ - 2 Score 0 0   Most recent cognitive screening:    01/12/2023   10:31 AM  6CIT Screen  What Year? 0 points  What  month? 0 points  What time? 0 points  Count back from 20 0 points  Months in reverse 0 points  Repeat phrase 6 points  Total Score 6 points   Most  recent Audit-C alcohol use screening    01/12/2023   10:26 AM  Alcohol Use Disorder Test (AUDIT)  1. How often do you have a drink containing alcohol? 0  2. How many drinks containing alcohol do you have on a typical day when you are drinking? 0  3. How often do you have six or more drinks on one occasion? 0  AUDIT-C Score 0   A score of 3 or more in women, and 4 or more in men indicates increased risk for alcohol abuse, EXCEPT if all of the points are from question 1   Vision/Hearing Screen: No results found.    Results for orders placed or performed in visit on 01/12/23  PSA, total and free  Result Value Ref Range   Prostate Specific Ag, Serum 0.5 0.0 - 4.0 ng/mL   PSA, Free 0.08 N/A ng/mL   PSA, Free Pct 16.0 %      Assessment & Plan   Annual wellness visit done today including the all of the following: Reviewed patient's Family Medical History Reviewed and updated list of patient's medical providers Assessment of cognitive impairment was done Assessed patient's functional ability Established a written schedule for health screening services Health Risk Assessent Completed and Reviewed  Exercise Activities and Dietary recommendations  Goals   None     Immunization History  Administered Date(s) Administered   Influenza,inj,Quad PF,6+ Mos 03/20/2013, 05/20/2014, 04/10/2015, 03/29/2016, 03/30/2017, 02/26/2019, 03/25/2020, 03/26/2021, 03/23/2022   PFIZER(Purple Top)SARS-COV-2 Vaccination 09/26/2019, 10/18/2019   Tdap 12/21/2012, 01/12/2023   Zoster Recombinant(Shingrix) 03/26/2021, 06/02/2021    Health Maintenance  Topic Date Due   Colonoscopy  04/09/2023   COVID-19 Vaccine (3 - 2023-24 season) 01/28/2023 (Originally 02/25/2022)   HIV Screening  03/30/2023 (Originally 02/12/1975)   INFLUENZA VACCINE  01/26/2023    Diabetic kidney evaluation - eGFR measurement  09/28/2023   Diabetic kidney evaluation - Urine ACR  09/28/2023   Medicare Annual Wellness (AWV)  01/12/2024   DTaP/Tdap/Td (3 - Td or Tdap) 01/11/2033   Hepatitis C Screening  Completed   Zoster Vaccines- Shingrix  Completed   HPV VACCINES  Aged Out     Discussed health benefits of physical activity, and encouraged him to engage in regular exercise appropriate for his age and condition.    Problem List Items Addressed This Visit       Active Problems   Right bundle branch block   Relevant Orders   Ambulatory referral to Cardiology   Other Visit Diagnoses     Welcome to Medicare preventive visit    -  Primary   Relevant Orders   EKG 12-Lead (Completed)   Benign prostatic hyperplasia with urinary frequency       Relevant Orders   PSA, total and free (Completed)   Need for Tdap vaccination       Relevant Orders   Tdap vaccine greater than or equal to 7yo IM (Completed)       Return in about 3 months (around 04/14/2023).     Mechele Claude, MD

## 2023-01-13 LAB — PSA, TOTAL AND FREE
PSA, Free Pct: 16 %
PSA, Free: 0.08 ng/mL
Prostate Specific Ag, Serum: 0.5 ng/mL (ref 0.0–4.0)

## 2023-01-14 ENCOUNTER — Encounter: Payer: Self-pay | Admitting: Family Medicine

## 2023-01-14 DIAGNOSIS — I4510 Unspecified right bundle-branch block: Secondary | ICD-10-CM | POA: Insufficient documentation

## 2023-01-23 ENCOUNTER — Encounter: Payer: Self-pay | Admitting: Cardiology

## 2023-01-23 NOTE — Progress Notes (Unsigned)
Cardiology Office Note   Date:  01/24/2023   ID:  Cesar Leon, DOB 05-30-1961, MRN [id number]  PCP:  Cesar Claude, MD  Cardiologist:   None Referring:  Cesar Claude, MD  No chief complaint on file.     History of Present Illness: Cesar Leon is a 62 y.o. male who presents for evaluation of an abnormal EKG.  He is found to have RBBB.  I see no old EKGs for comparison.  He has not had any prior cardiac workup other than years ago having a treadmill test.  He is limited in his activities because of the severe back pain.  He is able to do a little bit of walking on level ground but he cannot lift more than 10 pounds.  The patient denies any new symptoms such as chest discomfort, neck or arm discomfort. There has been no new shortness of breath, PND or orthopnea. There have been no reported palpitations, presyncope or syncope.   Past Medical History:  Diagnosis Date   Allergic rhinitis    DDD (degenerative disc disease) 12/28/2008   GERD (gastroesophageal reflux disease)    Hematuria    History of GI bleed    Hyperlipidemia    Hypogonadism male    Hypothyroidism    Internal hemorrhoid    Opioid type dependence, continuous (HCC) 09/28/2015   PVD (peripheral vascular disease) (HCC)     Past Surgical History:  Procedure Laterality Date   CARPAL TUNNEL RELEASE  8/11   right ankle   1976     Current Outpatient Medications  Medication Sig Dispense Refill   Acetaminophen (TYLENOL PO) Take 500 mg by mouth 2 (two) times daily. Patient takes 2 capsules twice daily     atorvastatin (LIPITOR) 40 MG tablet Take 1 tablet (40 mg total) by mouth daily. TAKE ONE (1) TABLET EACH DAY 90 tablet 3   Calcium Carb-Cholecalciferol (CALCIUM + D3 PO) Take by mouth.     fexofenadine-pseudoephedrine (ALLEGRA-D 24) 180-240 MG 24 hr tablet Take 1 tablet by mouth every evening. For allergy and congestion 30 tablet 11   fluticasone (FLONASE) 50 MCG/ACT nasal spray USE 2 SPRAYS IN EACH  NOSTRIL DAILY 48 g 1   meloxicam (MOBIC) 15 MG tablet Take 1 tablet (15 mg total) by mouth every other day. 45 tablet 3   Multiple Vitamins-Minerals (CENTRUM SILVER PO) Take 1 tablet by mouth daily.     omeprazole (PRILOSEC) 40 MG capsule TAKE ONE (1) CAPSULE EACH DAY 90 capsule 3   polyethylene glycol powder (GLYCOLAX/MIRALAX) 17 GM/SCOOP powder MIX 17 GRAMS INTO 8OZ OF WATER AND DRINK DAILY 510 g 1   testosterone cypionate (DEPOTESTOSTERONE CYPIONATE) 200 MG/ML injection INJECT 0.5ML INTO THE MUSCLE EVERY 14 DAYS 10 mL 1   Current Facility-Administered Medications  Medication Dose Route Frequency Provider Last Rate Last Admin   testosterone cypionate (DEPOTESTOSTERONE CYPIONATE) injection 100 mg  100 mg Intramuscular Q14 Days Cesar Claude, MD   100 mg at 01/11/23 4401    Allergies:   Metformin and related, Codeine, and Penicillins    Social History:  The patient  reports that he quit smoking about 28 years ago. His smoking use included cigarettes. He started smoking about 43 years ago. He has a 30 pack-year smoking history. He has never used smokeless tobacco. He reports current alcohol use. He reports that he does not use drugs.   Family History:  The patient's family history includes Anxiety disorder in his father; Cancer in his  father and mother; Depression in his father; Diabetes in his brother and father; Lung cancer in his father; Peripheral vascular disease in his father.    ROS:  Please see the history of present illness.   Otherwise, review of systems are positive for none.   All other systems are reviewed and negative.    PHYSICAL EXAM: VS:  BP 110/74 (BP Location: Left Arm, Patient Position: Sitting, Cuff Size: Large)   Pulse 90   Ht 5\' 8"  (1.727 m)   Wt 173 lb (78.5 kg)   SpO2 96%   BMI 26.30 kg/m  , BMI Body mass index is 26.3 kg/m. GENERAL:  Well appearing HEENT:  Pupils equal round and reactive, fundi not visualized, oral mucosa unremarkable NECK:  No jugular  venous distention, waveform within normal limits, carotid upstroke brisk and symmetric, no bruits, no thyromegaly LYMPHATICS:  No cervical, inguinal adenopathy LUNGS:  Clear to auscultation bilaterally BACK:  No CVA tenderness CHEST:  Unremarkable HEART:  PMI not displaced or sustained,S1 and S2 within normal limits, no S3, no S4, no clicks, no rubs, no murmurs ABD:  Flat, positive bowel sounds normal in frequency in pitch, no bruits, no rebound, no guarding, no midline pulsatile mass, no hepatomegaly, no splenomegaly EXT:  2 plus pulses throughout, no edema, no cyanosis no clubbing SKIN:  No rashes no nodules NEURO:  Cranial nerves II through XII grossly intact, motor grossly intact throughout PSYCH:  Cognitively intact, oriented to person place and time    EKG: Sinus rhythm, rate 75, right bundle branch block, no acute ST-T wave changes.  01/12/2023    Recent Labs: 09/28/2022: ALT 25; BUN 16; Creatinine, Ser 0.96; Hemoglobin 15.2; Platelets 282; Potassium 4.2; Sodium 139    Lipid Panel    Component Value Date/Time   CHOL 130 09/28/2022 0913   CHOL 113 12/21/2012 1010   TRIG 59 09/28/2022 0913   TRIG 85 04/29/2015 1048   TRIG 120 12/21/2012 1010   HDL 56 09/28/2022 0913   HDL 37 (L) 04/29/2015 1048   HDL 33 (L) 12/21/2012 1010   CHOLHDL 2.3 09/28/2022 0913   LDLCALC 61 09/28/2022 0913   LDLCALC 56 12/21/2012 1010      Wt Readings from Last 3 Encounters:  01/24/23 173 lb (78.5 kg)  01/12/23 179 lb 9.6 oz (81.5 kg)  09/28/22 172 lb 6.4 oz (78.2 kg)      Other studies Reviewed: Additional studies/ records that were reviewed today include: Labs. Review of the above records demonstrates:  Please see elsewhere in the note.     ASSESSMENT AND PLAN:  Abnormal EKG:   Patient has right bundle branch block but no symptoms related to this.  He has a normal exam.  There are no old studies for comparison.  However, in the absence of any symptoms I would not suggest further  imaging is indicated.  We did discuss the physiology and he would let me know if he ever has any lightheadedness, presyncope or syncope.   Current medicines are reviewed at length with the patient today.  The patient does not have concerns regarding medicines.  The following changes have been made:  no change  Labs/ tests ordered today include: None No orders of the defined types were placed in this encounter.    Disposition:   FU with me as needed.     Signed, Rollene Rotunda, MD  01/24/2023 2:20 PM    [facility]

## 2023-01-24 ENCOUNTER — Ambulatory Visit: Payer: Medicare HMO | Attending: Cardiology | Admitting: Cardiology

## 2023-01-24 ENCOUNTER — Encounter: Payer: Self-pay | Admitting: Cardiology

## 2023-01-24 VITALS — BP 110/74 | HR 90 | Ht 68.0 in | Wt 173.0 lb

## 2023-01-24 DIAGNOSIS — I4510 Unspecified right bundle-branch block: Secondary | ICD-10-CM | POA: Diagnosis not present

## 2023-01-24 NOTE — Patient Instructions (Signed)
    Follow-Up: At [facility], you and your health needs are our priority.  As part of our continuing mission to provide you with exceptional heart care, we have created designated Provider Care Teams.  These Care Teams include your primary Cardiologist (physician) and Advanced Practice Providers (APPs -  Physician Assistants and Nurse Practitioners) who all work together to provide you with the care you need, when you need it.  We recommend signing up for the patient portal called "MyChart".  Sign up information is provided on this After Visit Summary.  MyChart is used to connect with patients for Virtual Visits (Telemedicine).  Patients are able to view lab/test results, encounter notes, upcoming appointments, etc.  Non-urgent messages can be sent to your provider as well.   To learn more about what you can do with MyChart, go to [email or web address].    Your next appointment:   As needed        

## 2023-01-25 ENCOUNTER — Ambulatory Visit (INDEPENDENT_AMBULATORY_CARE_PROVIDER_SITE_OTHER): Payer: Medicare HMO

## 2023-01-25 DIAGNOSIS — E291 Testicular hypofunction: Secondary | ICD-10-CM | POA: Diagnosis not present

## 2023-01-25 DIAGNOSIS — E349 Endocrine disorder, unspecified: Secondary | ICD-10-CM

## 2023-01-25 NOTE — Progress Notes (Signed)
Testosterone injection given to left upper outer quadrant.  Patient tolerated well. 

## 2023-02-08 ENCOUNTER — Ambulatory Visit: Payer: Medicare HMO | Admitting: *Deleted

## 2023-02-08 DIAGNOSIS — E349 Endocrine disorder, unspecified: Secondary | ICD-10-CM

## 2023-02-08 DIAGNOSIS — E291 Testicular hypofunction: Secondary | ICD-10-CM

## 2023-02-08 NOTE — Progress Notes (Signed)
Testosterone injection given right upper outer quadrant, intramuscular

## 2023-02-22 ENCOUNTER — Ambulatory Visit (INDEPENDENT_AMBULATORY_CARE_PROVIDER_SITE_OTHER): Payer: Medicare HMO

## 2023-02-22 DIAGNOSIS — E291 Testicular hypofunction: Secondary | ICD-10-CM

## 2023-02-22 DIAGNOSIS — E349 Endocrine disorder, unspecified: Secondary | ICD-10-CM

## 2023-03-08 ENCOUNTER — Ambulatory Visit (INDEPENDENT_AMBULATORY_CARE_PROVIDER_SITE_OTHER): Payer: Medicare HMO

## 2023-03-08 DIAGNOSIS — E291 Testicular hypofunction: Secondary | ICD-10-CM | POA: Diagnosis not present

## 2023-03-08 DIAGNOSIS — Z23 Encounter for immunization: Secondary | ICD-10-CM

## 2023-03-08 NOTE — Progress Notes (Addendum)
Pt given testosterone injection right upper outer quadrant. Pt tolerated well. Pt also requested flu vaccine. Vaccine given and pt tolerated well

## 2023-03-22 ENCOUNTER — Ambulatory Visit (INDEPENDENT_AMBULATORY_CARE_PROVIDER_SITE_OTHER): Payer: Medicare HMO

## 2023-03-22 DIAGNOSIS — E291 Testicular hypofunction: Secondary | ICD-10-CM

## 2023-03-22 NOTE — Progress Notes (Signed)
Testosterone injection given to left upper outer quadrant.  Patient tolerated well.

## 2023-03-30 ENCOUNTER — Encounter: Payer: Self-pay | Admitting: Family Medicine

## 2023-03-30 ENCOUNTER — Ambulatory Visit: Payer: Medicare HMO | Admitting: Family Medicine

## 2023-03-30 VITALS — BP 118/72 | HR 79 | Temp 97.9°F | Ht 68.0 in | Wt 168.0 lb

## 2023-03-30 DIAGNOSIS — E782 Mixed hyperlipidemia: Secondary | ICD-10-CM | POA: Diagnosis not present

## 2023-03-30 DIAGNOSIS — W57XXXA Bitten or stung by nonvenomous insect and other nonvenomous arthropods, initial encounter: Secondary | ICD-10-CM | POA: Diagnosis not present

## 2023-03-30 DIAGNOSIS — S80869A Insect bite (nonvenomous), unspecified lower leg, initial encounter: Secondary | ICD-10-CM

## 2023-03-30 DIAGNOSIS — E119 Type 2 diabetes mellitus without complications: Secondary | ICD-10-CM | POA: Diagnosis not present

## 2023-03-30 DIAGNOSIS — E349 Endocrine disorder, unspecified: Secondary | ICD-10-CM

## 2023-03-30 LAB — BAYER DCA HB A1C WAIVED: HB A1C (BAYER DCA - WAIVED): 5.2 % (ref 4.8–5.6)

## 2023-03-30 MED ORDER — TRIAMCINOLONE ACETONIDE 0.1 % EX CREA: 1.0000 | TOPICAL_CREAM | Freq: Three times a day (TID) | CUTANEOUS | 0 refills | Status: DC

## 2023-03-30 NOTE — Progress Notes (Signed)
Subjective:  Patient ID: Cesar Leon, male    DOB: 04/23/1961  Age: 62 y.o. MRN: [id number]  CC: Medical Management of Chronic Issues   HPI Cesar Leon presents for testosterone follow up. Med working well without swelling. Good sense of well bbeing.without depression. Recently given disability. No longer working back is better.   Patient in for follow-up of GERD. Currently asymptomatic taking  PPI daily. There is no chest pain or heartburn. No hematemesis and no melena. No dysphagia or choking. Onset is remote. Progression is stable. Complicating factors, none.   in for follow-up of elevated cholesterol. Doing well without complaints on current medication. Denies side effects of statin including myalgia and arthralgia and nausea. Currently no chest pain, shortness of breath or other cardiovascular related symptoms noted.      03/30/2023    8:30 AM 01/12/2023   10:29 AM 01/12/2023   10:26 AM  Depression screen PHQ 2/9  Decreased Interest 0 0 0  Down, Depressed, Hopeless 0 0 0  PHQ - 2 Score 0 0 0    History Cesar Leon has a past medical history of Allergic rhinitis, DDD (degenerative disc disease) (12/28/2008), GERD (gastroesophageal reflux disease), Hematuria, History of GI bleed, Hyperlipidemia, Hypogonadism male, Hypothyroidism, Internal hemorrhoid, Opioid type dependence, continuous (HCC) (09/28/2015), and PVD (peripheral vascular disease) (HCC).   He has a past surgical history that includes Carpal tunnel release (8/11) and right ankle  (1976).   His family history includes Anxiety disorder in his father; Cancer in his father and mother; Depression in his father; Diabetes in his brother and father; Lung cancer in his father; Peripheral vascular disease in his father.He reports that he quit smoking about 28 years ago. His smoking use included cigarettes. He started smoking about 43 years ago. He has a 30 pack-year smoking history. He has never used smokeless tobacco. He  reports current alcohol use. He reports that he does not use drugs.    ROS Review of Systems  Constitutional:  Negative for fever.  Respiratory:  Negative for shortness of breath.   Cardiovascular:  Negative for chest pain.  Musculoskeletal:  Negative for arthralgias.  Skin:  Positive for rash (multiple bites on each lower leg. Was in tall grass and woods on 9/28 &29).    Objective:  BP 118/72   Pulse 79   Temp 97.9 F (36.6 C)   Ht 5\' 8"  (1.727 m)   Wt 168 lb (76.2 kg)   SpO2 99%   BMI 25.54 kg/m   BP Readings from Last 3 Encounters:  03/30/23 118/72  01/24/23 110/74  01/12/23 134/66    Wt Readings from Last 3 Encounters:  03/30/23 168 lb (76.2 kg)  01/24/23 173 lb (78.5 kg)  01/12/23 179 lb 9.6 oz (81.5 kg)     Physical Exam Vitals reviewed.  Constitutional:      Appearance: He is well-developed.  HENT:     Head: Normocephalic and atraumatic.     Right Ear: External ear normal.     Left Ear: External ear normal.     Mouth/Throat:     Pharynx: No oropharyngeal exudate or posterior oropharyngeal erythema.  Eyes:     Pupils: Pupils are equal, round, and reactive to light.  Cardiovascular:     Rate and Rhythm: Normal rate and regular rhythm.     Heart sounds: No murmur heard. Pulmonary:     Effort: No respiratory distress.     Breath sounds: Normal breath sounds.  Musculoskeletal:  Cervical back: Normal range of motion and neck supple.  Skin:    Findings: Rash (papular erythema lower legs after walking in tall grass a few days ago. Not stinging. Mild ich.) present.  Neurological:     Mental Status: He is alert and oriented to person, place, and time.       Assessment & Plan:   Cesar Leon was seen today for medical management of chronic issues.  Diagnoses and all orders for this visit:  Diabetes mellitus without complication (HCC) -     Bayer DCA Hb A1c Waived -     CBC with Differential/Platelet -     CMP14+EGFR  Testosterone deficiency -      Testosterone,Free and Total  Mixed hyperlipidemia -     Lipid panel  Insect bite of lower leg, unspecified laterality, initial encounter  Other orders -     triamcinolone cream (KENALOG) 0.1 %; Apply 1 Application topically 3 (three) times daily. Avoid face and genitalia       I am having Cesar Leon. Cesar Leon start on triamcinolone cream. I am also having him maintain his Calcium Carb-Cholecalciferol (CALCIUM + D3 PO), Multiple Vitamins-Minerals (CENTRUM SILVER PO), fluticasone, atorvastatin, fexofenadine-pseudoephedrine, meloxicam, omeprazole, polyethylene glycol powder, testosterone cypionate, and Acetaminophen (TYLENOL PO). We will continue to administer testosterone cypionate.  Allergies as of 03/30/2023       Reactions   Metformin And Related Nausea And Vomiting   Codeine    Penicillins         Medication List        Accurate as of March 30, 2023 11:59 PM. If you have any questions, ask your nurse or doctor.          atorvastatin 40 MG tablet Commonly known as: LIPITOR Take 1 tablet (40 mg total) by mouth daily. TAKE ONE (1) TABLET EACH DAY   CALCIUM + D3 PO Take by mouth.   CENTRUM SILVER PO Take 1 tablet by mouth daily.   fexofenadine-pseudoephedrine 180-240 MG 24 hr tablet Commonly known as: ALLEGRA-D 24 Take 1 tablet by mouth every evening. For allergy and congestion   fluticasone 50 MCG/ACT nasal spray Commonly known as: FLONASE USE 2 SPRAYS IN EACH NOSTRIL DAILY   meloxicam 15 MG tablet Commonly known as: MOBIC Take 1 tablet (15 mg total) by mouth every other day.   omeprazole 40 MG capsule Commonly known as: PRILOSEC TAKE ONE (1) CAPSULE EACH DAY   polyethylene glycol powder 17 GM/SCOOP powder Commonly known as: GLYCOLAX/MIRALAX MIX 17 GRAMS INTO 8OZ OF WATER AND DRINK DAILY   testosterone cypionate 200 MG/ML injection Commonly known as: DEPOTESTOSTERONE CYPIONATE INJECT 0.5ML INTO THE MUSCLE EVERY 14 DAYS   triamcinolone cream 0.1  % Commonly known as: KENALOG Apply 1 Application topically 3 (three) times daily. Avoid face and genitalia Started by: Demba Nigh   TYLENOL PO Take 500 mg by mouth 2 (two) times daily. Patient takes 2 capsules twice daily         Follow-up: Return in about 6 months (around 09/28/2023).  Mechele Claude, M.D.

## 2023-04-02 ENCOUNTER — Encounter: Payer: Self-pay | Admitting: Family Medicine

## 2023-04-02 LAB — LIPID PANEL
Chol/HDL Ratio: 2.4 {ratio} (ref 0.0–5.0)
Cholesterol, Total: 132 mg/dL (ref 100–199)
HDL: 55 mg/dL (ref >39)
LDL Chol Calc (NIH): 65 mg/dL (ref 0–99)
Triglycerides: 57 mg/dL (ref 0–149)
VLDL Cholesterol Cal: 12 mg/dL (ref 5–40)

## 2023-04-02 LAB — CMP14+EGFR
ALT: 25 [IU]/L (ref 0–44)
AST: 30 [IU]/L (ref 0–40)
Albumin: 4.5 g/dL (ref 3.9–4.9)
Alkaline Phosphatase: 68 [IU]/L (ref 44–121)
BUN/Creatinine Ratio: 16 (ref 10–24)
BUN: 15 mg/dL (ref 8–27)
Bilirubin Total: 0.8 mg/dL (ref 0.0–1.2)
CO2: 27 mmol/L (ref 20–29)
Calcium: 9.2 mg/dL (ref 8.6–10.2)
Chloride: 98 mmol/L (ref 96–106)
Creatinine, Ser: 0.96 mg/dL (ref 0.76–1.27)
Globulin, Total: 1.8 g/dL (ref 1.5–4.5)
Glucose: 108 mg/dL — ABNORMAL HIGH (ref 70–99)
Potassium: 3.8 mmol/L (ref 3.5–5.2)
Sodium: 139 mmol/L (ref 134–144)
Total Protein: 6.3 g/dL (ref 6.0–8.5)
eGFR: 89 mL/min/{1.73_m2} (ref >59)

## 2023-04-02 LAB — CBC WITH DIFFERENTIAL/PLATELET
Basophils Absolute: 0 10*3/uL (ref 0.0–0.2)
Basos: 1 %
EOS (ABSOLUTE): 0.1 10*3/uL (ref 0.0–0.4)
Eos: 3 %
Hematocrit: 42.1 % (ref 37.5–51.0)
Hemoglobin: 13.9 g/dL (ref 13.0–17.7)
Immature Grans (Abs): 0 10*3/uL (ref 0.0–0.1)
Immature Granulocytes: 0 %
Lymphocytes Absolute: 1 10*3/uL (ref 0.7–3.1)
Lymphs: 32 %
MCH: 30.6 pg (ref 26.6–33.0)
MCHC: 33 g/dL (ref 31.5–35.7)
MCV: 93 fL (ref 79–97)
Monocytes Absolute: 0.3 10*3/uL (ref 0.1–0.9)
Monocytes: 9 %
Neutrophils Absolute: 1.8 10*3/uL (ref 1.4–7.0)
Neutrophils: 55 %
Platelets: 249 10*3/uL (ref 150–450)
RBC: 4.54 x10E6/uL (ref 4.14–5.80)
RDW: 11.8 % (ref 11.6–15.4)
WBC: 3.2 10*3/uL — ABNORMAL LOW (ref 3.4–10.8)

## 2023-04-02 LAB — TESTOSTERONE,FREE AND TOTAL
Testosterone, Free: 11.2 pg/mL (ref 6.6–18.1)
Testosterone: 543 ng/dL (ref 264–916)

## 2023-04-03 NOTE — Progress Notes (Signed)
Hello Cesar Leon,  Your lab result is normal and/or stable.Some minor variations that are not significant are commonly marked abnormal, but do not represent any medical problem for you.  Best regards, Virna Livengood, M.D.

## 2023-04-05 ENCOUNTER — Ambulatory Visit (INDEPENDENT_AMBULATORY_CARE_PROVIDER_SITE_OTHER): Payer: Medicare HMO

## 2023-04-05 DIAGNOSIS — E291 Testicular hypofunction: Secondary | ICD-10-CM | POA: Diagnosis not present

## 2023-04-05 NOTE — Progress Notes (Signed)
Testosterone injection given to right upper outer quadrant.  Patient tolerated well. 

## 2023-04-19 ENCOUNTER — Ambulatory Visit (INDEPENDENT_AMBULATORY_CARE_PROVIDER_SITE_OTHER): Payer: Medicare HMO | Admitting: *Deleted

## 2023-04-19 DIAGNOSIS — E291 Testicular hypofunction: Secondary | ICD-10-CM | POA: Diagnosis not present

## 2023-04-19 NOTE — Progress Notes (Signed)
Pt given Testosterone injection IM left glut and tolerated well.

## 2023-05-03 ENCOUNTER — Ambulatory Visit (INDEPENDENT_AMBULATORY_CARE_PROVIDER_SITE_OTHER): Payer: Medicare HMO

## 2023-05-03 DIAGNOSIS — E291 Testicular hypofunction: Secondary | ICD-10-CM | POA: Diagnosis not present

## 2023-05-09 DIAGNOSIS — Z1211 Encounter for screening for malignant neoplasm of colon: Secondary | ICD-10-CM | POA: Diagnosis not present

## 2023-05-09 DIAGNOSIS — K5730 Diverticulosis of large intestine without perforation or abscess without bleeding: Secondary | ICD-10-CM | POA: Diagnosis not present

## 2023-05-09 DIAGNOSIS — K648 Other hemorrhoids: Secondary | ICD-10-CM | POA: Diagnosis not present

## 2023-05-09 DIAGNOSIS — Z83719 Family history of colon polyps, unspecified: Secondary | ICD-10-CM | POA: Diagnosis not present

## 2023-05-09 LAB — HM COLONOSCOPY

## 2023-05-10 ENCOUNTER — Other Ambulatory Visit: Payer: Self-pay | Admitting: Family Medicine

## 2023-05-17 ENCOUNTER — Ambulatory Visit (INDEPENDENT_AMBULATORY_CARE_PROVIDER_SITE_OTHER): Payer: Medicare HMO | Admitting: *Deleted

## 2023-05-17 DIAGNOSIS — E291 Testicular hypofunction: Secondary | ICD-10-CM

## 2023-05-17 NOTE — Progress Notes (Signed)
In today for testosterone injection, tolerated well

## 2023-05-31 ENCOUNTER — Ambulatory Visit (INDEPENDENT_AMBULATORY_CARE_PROVIDER_SITE_OTHER): Payer: Medicare HMO | Admitting: *Deleted

## 2023-05-31 DIAGNOSIS — E291 Testicular hypofunction: Secondary | ICD-10-CM | POA: Diagnosis not present

## 2023-05-31 NOTE — Progress Notes (Signed)
Pt given testosterone injection IM left glut and tolerated well.

## 2023-06-09 ENCOUNTER — Other Ambulatory Visit: Payer: Self-pay | Admitting: Family Medicine

## 2023-06-14 ENCOUNTER — Other Ambulatory Visit: Payer: Self-pay | Admitting: Family Medicine

## 2023-06-14 ENCOUNTER — Ambulatory Visit (INDEPENDENT_AMBULATORY_CARE_PROVIDER_SITE_OTHER): Payer: Medicare HMO

## 2023-06-14 DIAGNOSIS — E291 Testicular hypofunction: Secondary | ICD-10-CM

## 2023-06-14 NOTE — Progress Notes (Signed)
Patient is in office today for a nurse visit for Testosterone Injection. Patient Injection was given in the  Left upper quad. gluteus. Patient tolerated injection well.

## 2023-06-29 ENCOUNTER — Ambulatory Visit (INDEPENDENT_AMBULATORY_CARE_PROVIDER_SITE_OTHER): Payer: Medicare HMO | Admitting: *Deleted

## 2023-06-29 DIAGNOSIS — E291 Testicular hypofunction: Secondary | ICD-10-CM | POA: Diagnosis not present

## 2023-06-29 NOTE — Progress Notes (Signed)
 Testosterone injection given right upper outer quadrant. Patient tolerated well. Patient given 27ml/200 mg. Should have been 0.5 ml.100 mg.

## 2023-07-12 ENCOUNTER — Ambulatory Visit (INDEPENDENT_AMBULATORY_CARE_PROVIDER_SITE_OTHER): Payer: Medicare HMO | Admitting: *Deleted

## 2023-07-12 DIAGNOSIS — E291 Testicular hypofunction: Secondary | ICD-10-CM | POA: Diagnosis not present

## 2023-07-12 NOTE — Progress Notes (Signed)
 Pt in today for testosterone  injection. Injection was tolerated well by the patient. (See MAR for injection details)

## 2023-07-26 ENCOUNTER — Ambulatory Visit (INDEPENDENT_AMBULATORY_CARE_PROVIDER_SITE_OTHER): Payer: Medicare HMO

## 2023-07-26 DIAGNOSIS — H524 Presbyopia: Secondary | ICD-10-CM | POA: Diagnosis not present

## 2023-07-26 DIAGNOSIS — E291 Testicular hypofunction: Secondary | ICD-10-CM

## 2023-07-26 NOTE — Progress Notes (Signed)
Patient is in office today for a nurse visit for Testosterone Injection. Patient Injection was given in the  Right upper quad. gluteus. Patient tolerated injection well.

## 2023-08-09 ENCOUNTER — Ambulatory Visit (INDEPENDENT_AMBULATORY_CARE_PROVIDER_SITE_OTHER): Payer: Medicare HMO

## 2023-08-09 DIAGNOSIS — E291 Testicular hypofunction: Secondary | ICD-10-CM

## 2023-08-09 NOTE — Progress Notes (Unsigned)
Patient is in office today for a nurse visit for Testosterone Injection. Patient Injection was given in the  Left upper quad. gluteus. Patient tolerated injection well.

## 2023-08-23 ENCOUNTER — Ambulatory Visit (INDEPENDENT_AMBULATORY_CARE_PROVIDER_SITE_OTHER): Payer: Medicare HMO

## 2023-08-23 DIAGNOSIS — E291 Testicular hypofunction: Secondary | ICD-10-CM

## 2023-08-23 NOTE — Progress Notes (Signed)
 Patient is in office today for a nurse visit for Testosterone Injection. Patient Injection was given in the  Right upper quad. gluteus. Patient tolerated injection well.

## 2023-09-06 ENCOUNTER — Ambulatory Visit (INDEPENDENT_AMBULATORY_CARE_PROVIDER_SITE_OTHER): Payer: Federal, State, Local not specified - PPO

## 2023-09-06 DIAGNOSIS — E291 Testicular hypofunction: Secondary | ICD-10-CM

## 2023-09-06 NOTE — Progress Notes (Signed)
 Patient is in office today for a nurse visit for Testosterone Injection. Patient Injection was given in the  Left upper quad. gluteus. Patient tolerated injection well.

## 2023-09-14 ENCOUNTER — Other Ambulatory Visit: Payer: Self-pay | Admitting: Family Medicine

## 2023-09-17 ENCOUNTER — Other Ambulatory Visit: Payer: Self-pay | Admitting: Family Medicine

## 2023-09-17 DIAGNOSIS — K219 Gastro-esophageal reflux disease without esophagitis: Secondary | ICD-10-CM

## 2023-09-20 ENCOUNTER — Ambulatory Visit (INDEPENDENT_AMBULATORY_CARE_PROVIDER_SITE_OTHER): Payer: Federal, State, Local not specified - PPO | Admitting: *Deleted

## 2023-09-20 DIAGNOSIS — E291 Testicular hypofunction: Secondary | ICD-10-CM

## 2023-09-20 NOTE — Progress Notes (Signed)
 Patient is in office today for a nurse visit for Testosterone Injection. Patient Injection was given in the  Right upper quad. gluteus. Patient tolerated injection well.

## 2023-09-28 ENCOUNTER — Encounter: Payer: Self-pay | Admitting: Family Medicine

## 2023-09-28 ENCOUNTER — Ambulatory Visit: Payer: Medicare HMO | Admitting: Family Medicine

## 2023-09-28 VITALS — BP 123/72 | HR 68 | Temp 97.5°F | Ht 68.0 in | Wt 176.0 lb

## 2023-09-28 DIAGNOSIS — Z23 Encounter for immunization: Secondary | ICD-10-CM | POA: Diagnosis not present

## 2023-09-28 DIAGNOSIS — E291 Testicular hypofunction: Secondary | ICD-10-CM | POA: Diagnosis not present

## 2023-09-28 DIAGNOSIS — E782 Mixed hyperlipidemia: Secondary | ICD-10-CM | POA: Diagnosis not present

## 2023-09-28 NOTE — Progress Notes (Signed)
 Subjective:  Patient ID: Cesar Leon, male    DOB: Aug 22, 1960  Age: 63 y.o. MRN: [id number]  CC: Medical Management of Chronic Issues (No concerns at this time. )   HPI Cesar Leon presents for   Last testosterone shot was 8 days ago. On a 14 day cycle.    in for follow-up of elevated cholesterol. Doing well without complaints on current medication. Denies side effects of statin including myalgia and arthralgia and nausea. Currently no chest pain, shortness of breath or other cardiovascular related symptoms noted.  Patient in for follow-up of GERD. Currently asymptomatic taking  PPI daily. There is no chest pain or heartburn. No hematemesis and no melena. No dysphagia or choking. Onset is remote. Progression is stable. Complicating factors, none.         03/30/2023    8:30 AM 01/12/2023   10:29 AM 01/12/2023   10:26 AM  Depression screen PHQ 2/9  Decreased Interest 0 0 0  Down, Depressed, Hopeless 0 0 0  PHQ - 2 Score 0 0 0    History Zach has a past medical history of Allergic rhinitis, DDD (degenerative disc disease) (12/28/2008), GERD (gastroesophageal reflux disease), Hematuria, History of GI bleed, Hyperlipidemia, Hypogonadism male, Hypothyroidism, Internal hemorrhoid, Opioid type dependence, continuous (HCC) (09/28/2015), and PVD (peripheral vascular disease) (HCC).   He has a past surgical history that includes Carpal tunnel release (8/11) and right ankle  (1976).   His family history includes Anxiety disorder in his father; Cancer in his father and mother; Depression in his father; Diabetes in his brother and father; Lung cancer in his father; Peripheral vascular disease in his father.He reports that he quit smoking about 29 years ago. His smoking use included cigarettes. He started smoking about 44 years ago. He has a 30 pack-year smoking history. He has never used smokeless tobacco. He reports current alcohol use. He reports that he does not use  drugs.    ROS Review of Systems  Constitutional: Negative.  Negative for fever.  HENT: Negative.    Eyes:  Negative for visual disturbance.  Respiratory:  Negative for cough and shortness of breath.   Cardiovascular:  Negative for chest pain and leg swelling.  Gastrointestinal:  Negative for abdominal pain, diarrhea, nausea and vomiting.  Genitourinary:  Negative for difficulty urinating.  Musculoskeletal:  Negative for arthralgias and myalgias.  Skin:  Negative for rash.  Allergic/Immunologic: Positive for environmental allergies.  Neurological:  Negative for headaches.  Psychiatric/Behavioral:  Negative for sleep disturbance.     Objective:  BP 123/72   Pulse 68   Temp (!) 97.5 F (36.4 C)   Ht 5\' 8"  (1.727 m)   Wt 176 lb (79.8 kg)   SpO2 99%   BMI 26.76 kg/m   BP Readings from Last 3 Encounters:  09/28/23 123/72  03/30/23 118/72  01/24/23 110/74    Wt Readings from Last 3 Encounters:  09/28/23 176 lb (79.8 kg)  03/30/23 168 lb (76.2 kg)  01/24/23 173 lb (78.5 kg)     Physical Exam Vitals reviewed.  Constitutional:      Appearance: He is well-developed.  HENT:     Head: Normocephalic and atraumatic.     Right Ear: External ear normal.     Left Ear: External ear normal.     Mouth/Throat:     Pharynx: No oropharyngeal exudate or posterior oropharyngeal erythema.  Eyes:     Pupils: Pupils are equal, round, and reactive to light.  Cardiovascular:  Rate and Rhythm: Normal rate and regular rhythm.     Heart sounds: No murmur heard. Pulmonary:     Effort: No respiratory distress.     Breath sounds: Normal breath sounds.  Musculoskeletal:     Cervical back: Normal range of motion and neck supple.  Neurological:     Mental Status: He is alert and oriented to person, place, and time.     Assessment & Plan:  Immunization due -     Pneumococcal conjugate vaccine 20-valent     Follow-up: No follow-ups on file.  Mechele Claude, M.D.

## 2023-09-29 ENCOUNTER — Other Ambulatory Visit: Payer: Self-pay | Admitting: Family Medicine

## 2023-09-29 DIAGNOSIS — J301 Allergic rhinitis due to pollen: Secondary | ICD-10-CM

## 2023-09-30 MED ORDER — ATORVASTATIN CALCIUM 40 MG PO TABS: 40.0000 mg | ORAL_TABLET | Freq: Every day | ORAL | 0 refills | Status: DC

## 2023-09-30 MED ORDER — TESTOSTERONE CYPIONATE 200 MG/ML IM SOLN: INTRAMUSCULAR | 1 refills | Status: DC

## 2023-10-03 ENCOUNTER — Other Ambulatory Visit: Payer: Self-pay | Admitting: Family Medicine

## 2023-10-03 ENCOUNTER — Encounter: Payer: Self-pay | Admitting: Family Medicine

## 2023-10-03 LAB — TESTOSTERONE,FREE AND TOTAL
Testosterone, Free: 3.6 pg/mL — ABNORMAL LOW (ref 6.6–18.1)
Testosterone: 251 ng/dL — ABNORMAL LOW (ref 264–916)

## 2023-10-03 MED ORDER — TESTOSTERONE CYPIONATE 200 MG/ML IM SOLN: INTRAMUSCULAR | 1 refills | Status: DC

## 2023-10-04 ENCOUNTER — Ambulatory Visit (INDEPENDENT_AMBULATORY_CARE_PROVIDER_SITE_OTHER): Payer: Federal, State, Local not specified - PPO

## 2023-10-04 DIAGNOSIS — E291 Testicular hypofunction: Secondary | ICD-10-CM | POA: Diagnosis not present

## 2023-10-04 NOTE — Progress Notes (Signed)
 Patient is in office today for a nurse visit for Testosterone Injection. Patient Injection was given in the  Left upper quad. gluteus. Patient tolerated injection well.

## 2023-10-18 ENCOUNTER — Ambulatory Visit (INDEPENDENT_AMBULATORY_CARE_PROVIDER_SITE_OTHER): Payer: Federal, State, Local not specified - PPO

## 2023-10-18 DIAGNOSIS — E291 Testicular hypofunction: Secondary | ICD-10-CM

## 2023-10-18 MED ORDER — TESTOSTERONE CYPIONATE 200 MG/ML IM SOLN
150.0000 mg | INTRAMUSCULAR | Status: DC
  Administered 2023-10-18 – 2024-03-20 (×13): 150 mg via INTRAMUSCULAR

## 2023-10-18 NOTE — Progress Notes (Signed)
 Patient is in office today for a nurse visit for Testosterone Injection. Patient Injection was given in the  Right upper quad. gluteus. Patient tolerated injection well.

## 2023-10-26 ENCOUNTER — Encounter: Payer: Self-pay | Admitting: *Deleted

## 2023-11-01 ENCOUNTER — Ambulatory Visit (INDEPENDENT_AMBULATORY_CARE_PROVIDER_SITE_OTHER): Payer: Federal, State, Local not specified - PPO

## 2023-11-01 DIAGNOSIS — E291 Testicular hypofunction: Secondary | ICD-10-CM

## 2023-11-01 NOTE — Progress Notes (Signed)
 Patient is in office today for a nurse visit for Testosterone Injection. Patient Injection was given in the  Left upper quad. gluteus. Patient tolerated injection well.

## 2023-11-15 ENCOUNTER — Ambulatory Visit (INDEPENDENT_AMBULATORY_CARE_PROVIDER_SITE_OTHER): Payer: Federal, State, Local not specified - PPO

## 2023-11-15 DIAGNOSIS — E291 Testicular hypofunction: Secondary | ICD-10-CM | POA: Diagnosis not present

## 2023-11-15 NOTE — Progress Notes (Signed)
 Patient is in office today for a nurse visit for Testosterone Injection. Patient Injection was given in the  Right upper quad. gluteus. Patient tolerated injection well.

## 2023-11-29 ENCOUNTER — Ambulatory Visit (INDEPENDENT_AMBULATORY_CARE_PROVIDER_SITE_OTHER): Payer: Federal, State, Local not specified - PPO

## 2023-11-29 DIAGNOSIS — E291 Testicular hypofunction: Secondary | ICD-10-CM

## 2023-11-29 NOTE — Progress Notes (Signed)
 Patient is in office today for a nurse visit for Testosterone Injection. Patient Injection was given in the  Left upper quad. gluteus. Patient tolerated injection well.

## 2023-12-13 ENCOUNTER — Ambulatory Visit: Payer: Federal, State, Local not specified - PPO

## 2023-12-13 ENCOUNTER — Ambulatory Visit (INDEPENDENT_AMBULATORY_CARE_PROVIDER_SITE_OTHER)

## 2023-12-13 DIAGNOSIS — E291 Testicular hypofunction: Secondary | ICD-10-CM | POA: Diagnosis not present

## 2023-12-13 NOTE — Progress Notes (Signed)
 Patient is in office today for a nurse visit for Testosterone Injection. Patient Injection was given in the  Right upper quad. gluteus. Patient tolerated injection well.

## 2023-12-27 ENCOUNTER — Ambulatory Visit: Payer: Federal, State, Local not specified - PPO | Admitting: *Deleted

## 2023-12-27 DIAGNOSIS — E291 Testicular hypofunction: Secondary | ICD-10-CM

## 2023-12-27 NOTE — Patient Instructions (Signed)
 Testosterone  injection given left upper outer quadrant intramuscular. Patient supplied. Patient tolerated well.

## 2024-01-10 ENCOUNTER — Ambulatory Visit (INDEPENDENT_AMBULATORY_CARE_PROVIDER_SITE_OTHER): Payer: Federal, State, Local not specified - PPO

## 2024-01-10 DIAGNOSIS — E291 Testicular hypofunction: Secondary | ICD-10-CM | POA: Diagnosis not present

## 2024-01-10 NOTE — Progress Notes (Signed)
 Patient is in office today for a nurse visit for Testosterone Injection. Patient Injection was given in the  Right upper quad. gluteus. Patient tolerated injection well.

## 2024-01-16 ENCOUNTER — Ambulatory Visit: Payer: Medicare HMO

## 2024-01-16 VITALS — BP 123/72 | HR 68 | Ht 68.0 in | Wt 176.0 lb

## 2024-01-16 DIAGNOSIS — Z0000 Encounter for general adult medical examination without abnormal findings: Secondary | ICD-10-CM | POA: Diagnosis not present

## 2024-01-16 NOTE — Progress Notes (Signed)
 Subjective:   Cesar Leon is a 63 y.o. who presents for a Medicare Wellness preventive visit.  As a reminder, Annual Wellness Visits don't include a physical exam, and some assessments may be limited, especially if this visit is performed virtually. We may recommend an in-person follow-up visit with your provider if needed.  Visit Complete: Virtual I connected with  [name] on 01/16/24 by a audio enabled telemedicine application and verified that I am speaking with the correct person using two identifiers.  Patient Location: Home  Provider Location: Home Office  I discussed the limitations of evaluation and management by telemedicine. The patient expressed understanding and agreed to proceed.  Vital Signs: Because this visit was a virtual/telehealth visit, some criteria may be missing or patient reported. Any vitals not documented were not able to be obtained and vitals that have been documented are patient reported.  VideoDeclined- This patient declined Librarian, academic. Therefore the visit was completed with audio only.  Persons Participating in Visit: Patient.  AWV Questionnaire: No: Patient Medicare AWV questionnaire was not completed prior to this visit.  Cardiac Risk Factors include: advanced age (>72men, >13 women);male gender;dyslipidemia     Objective:    Today's Vitals   01/16/24 1030  BP: 123/72  Pulse: 68  Weight: 176 lb (79.8 kg)  Height: 5' 8 (1.727 m)   Body mass index is 26.76 kg/m.     01/16/2024    8:43 AM 01/12/2023   10:28 AM 03/21/2019    8:09 PM 04/23/2018    8:53 AM 08/21/2017   10:12 AM  Advanced Directives  Does Patient Have a Medical Advance Directive? No Yes Yes Yes  Yes   Type of Advance Directive  Living will;Out of facility DNR (pink MOST or yellow form) Living will Living will   Would patient like information on creating a medical advance directive?  No - Patient declined  No - Patient declined        Data saved with a previous flowsheet row definition    Current Medications (verified) Outpatient Encounter Medications as of 01/16/2024  Medication Sig   Acetaminophen  (TYLENOL  PO) Take 500 mg by mouth 2 (two) times daily. Patient takes 2 capsules twice daily   ALLEGRA-D ALLERGY & CONGESTION 180-240 MG 24 hr tablet TAKE 1 TABLET BY MOUTH EVERY EVENING. FOR ALLERGY AND CONGESTION   atorvastatin  (LIPITOR) 40 MG tablet Take 1 tablet (40 mg total) by mouth daily.   Calcium  Carb-Cholecalciferol (CALCIUM  + D3 PO) Take by mouth.   fluticasone  (FLONASE ) 50 MCG/ACT nasal spray USE 2 SPRAYS IN EACH NOSTRIL DAILY   meloxicam  (MOBIC ) 15 MG tablet Take 1 tablet (15 mg total) by mouth every other day.   Multiple Vitamins-Minerals (CENTRUM SILVER PO) Take 1 tablet by mouth daily.   omeprazole  (PRILOSEC) 40 MG capsule TAKE ONE (1) CAPSULE EACH DAY   polyethylene glycol powder (GLYCOLAX /MIRALAX ) 17 GM/SCOOP powder MIX 17 GRAMS INTO 8OZ OF WATER AND DRINK DAILY   testosterone  cypionate (DEPOTESTOSTERONE CYPIONATE) 200 MG/ML injection INJECT 0.75ML INTO THE MUSCLE EVERY 14 DAYS   triamcinolone  cream (KENALOG ) 0.1 % APPLY 1 APPLICATION TOPICALLY 3 (THREE) TIMES DAILY. AVOID FACE AND GENITALIA   Facility-Administered Encounter Medications as of 01/16/2024  Medication   testosterone  cypionate (DEPOTESTOSTERONE CYPIONATE) injection 100 mg   testosterone  cypionate (DEPOTESTOSTERONE CYPIONATE) injection 150 mg    Allergies (verified) Metformin  and related, Codeine, and Penicillins   History: Past Medical History:  Diagnosis Date   Allergic rhinitis  DDD (degenerative disc disease) 12/28/2008   GERD (gastroesophageal reflux disease)    Hematuria    History of GI bleed    Hyperlipidemia    Hypogonadism male    Hypothyroidism    Internal hemorrhoid    Opioid type dependence, continuous (HCC) 09/28/2015   PVD (peripheral vascular disease) (HCC)    Past Surgical History:  Procedure Laterality  Date   CARPAL TUNNEL RELEASE  01/25/2010   COLON SURGERY     right ankle   06/27/1974   Family History  Problem Relation Age of Onset   Cancer Mother        genital   Cancer Father        lung   Lung cancer Father        smoker    Anxiety disorder Father    Depression Father    Diabetes Father    Peripheral vascular disease Father    Diabetes Brother    Social History   Socioeconomic History   Marital status: Married    Spouse name: Leita   Number of children: 1   Years of education: Not on file   Highest education level: 10th grade  Occupational History   Occupation: Disabled  Tobacco Use   Smoking status: Former    Current packs/day: 0.00    Average packs/day: 2.0 packs/day for 15.0 years (30.0 ttl pk-yrs)    Types: Cigarettes    Start date: 06/28/1979    Quit date: 06/27/1994    Years since quitting: 29.5   Smokeless tobacco: Never  Vaping Use   Vaping status: Never Used  Substance and Sexual Activity   Alcohol use: Yes    Comment: occ   Drug use: No   Sexual activity: Not on file  Other Topics Concern   Not on file  Social History Narrative   Disabled, lives with wife Leita, one biological son, one step daughter, both grown.   Rides motorcycle.    Social Drivers of Corporate investment banker Strain: Low Risk  (01/16/2024)   Overall Financial Resource Strain (CARDIA)    Difficulty of Paying Living Expenses: Not hard at all  Food Insecurity: No Food Insecurity (01/16/2024)   Hunger Vital Sign    Worried About Running Out of Food in the Last Year: Never true    Ran Out of Food in the Last Year: Never true  Transportation Needs: No Transportation Needs (01/16/2024)   PRAPARE - Administrator, Civil Service (Medical): No    Lack of Transportation (Non-Medical): No  Physical Activity: Sufficiently Active (01/16/2024)   Exercise Vital Sign    Days of Exercise per Week: 7 days    Minutes of Exercise per Session: 60 min  Stress: No Stress Concern  Present (01/16/2024)   [employer] of Occupational Health - Occupational Stress Questionnaire    Feeling of Stress: Not at all  Social Connections: Moderately Integrated (01/16/2024)   Social Connection and Isolation Panel    Frequency of Communication with Friends and Family: More than three times a week    Frequency of Social Gatherings with Friends and Family: More than three times a week    Attends Religious Services: 1 to 4 times per year    Active Member of [employer] or Organizations: No    Attends Banker Meetings: Never    Marital Status: Married    Tobacco Counseling Counseling given: Yes    Clinical Intake:  Pre-visit preparation completed: Yes  Pain :  No/denies pain     BMI - recorded: 26.76 Nutritional Status: BMI 25 -29 Overweight Nutritional Risks: None Diabetes: No  Lab Results  Component Value Date   HGBA1C 5.2 03/30/2023   HGBA1C 5.3 09/28/2022   HGBA1C 5.4 03/29/2022     How often do you need to have someone help you when you read instructions, pamphlets, or other written materials from your doctor or pharmacy?: 1 - Never  Interpreter Needed?: No  Information entered by :: alia t/cma   Activities of Daily Living     01/16/2024    8:42 AM  In your present state of health, do you have any difficulty performing the following activities:  Hearing? 0  Vision? 0  Difficulty concentrating or making decisions? 0  Walking or climbing stairs? 0  Dressing or bathing? 0  Doing errands, shopping? 0  Preparing Food and eating ? N  Using the Toilet? N  In the past six months, have you accidently leaked urine? N  Do you have problems with loss of bowel control? N  Managing your Medications? N  Managing your Finances? N  Housekeeping or managing your Housekeeping? N    Patient Care Team: Zollie Lowers, MD as PCP - General (Family Medicine) Lavona Agent, MD as PCP - Cardiology (Cardiology)  I have updated your Care Teams any recent  Medical Services you may have received from other providers in the past year.     Assessment:   This is a routine wellness examination for Keshun.  Hearing/Vision screen Hearing Screening - Comments:: Pt denies hearing dif Vision Screening - Comments:: Pt wear glasses/pt goes to [facility] [address] in [place]/last of 2025   Goals Addressed             This Visit's Progress    Patient Stated       Pt would like to travel more       Depression Screen     01/16/2024    8:48 AM 03/30/2023    8:30 AM 01/12/2023   10:29 AM 01/12/2023   10:26 AM 09/28/2022    8:21 AM 03/29/2022   11:14 AM 09/21/2021    7:48 AM  PHQ 2/9 Scores  PHQ - 2 Score 0 0 0 0 0 0 0  PHQ- 9 Score 0          Fall Risk     01/16/2024    8:39 AM 03/30/2023    8:29 AM 01/12/2023   10:29 AM 09/28/2022    8:21 AM 03/29/2022   11:14 AM  Fall Risk   Falls in the past year? 0 0 0 0 0  Number falls in past yr: 0      Injury with Fall? 0      Risk for fall due to : No Fall Risks      Follow up Falls evaluation completed        MEDICARE RISK AT HOME:  Medicare Risk at Home Any stairs in or around the home?: Yes If so, are there any without handrails?: Yes Home free of loose throw rugs in walkways, pet beds, electrical cords, etc?: Yes Adequate lighting in your home to reduce risk of falls?: Yes Life alert?: No Use of a cane, walker or w/c?: No Grab bars in the bathroom?: Yes Shower chair or bench in shower?: No Elevated toilet seat or a handicapped toilet?: No  TIMED UP AND GO:  Was the test performed?  no  Cognitive Function: 6CIT completed  01/16/2024    8:43 AM 01/12/2023   10:31 AM  6CIT Screen  What Year? 0 points 0 points  What month? 0 points 0 points  What time? 0 points 0 points  Count back from 20 0 points 0 points  Months in reverse 0 points 0 points  Repeat phrase 0 points 6 points  Total Score 0 points 6 points    Immunizations Immunization History  Administered Date(s)  Administered   Influenza, Seasonal, Injecte, Preservative Fre 03/08/2023   Influenza,inj,Quad PF,6+ Mos 03/20/2013, 05/20/2014, 04/10/2015, 03/29/2016, 03/30/2017, 02/26/2019, 03/25/2020, 03/26/2021, 03/23/2022   Moderna Covid-19 Fall Seasonal Vaccine 26yrs & older 06/01/2023   PFIZER(Purple Top)SARS-COV-2 Vaccination 09/26/2019, 10/18/2019, 06/16/2020, 06/02/2021   PNEUMOCOCCAL CONJUGATE-20 09/28/2023   Tdap 12/21/2012, 01/12/2023   Zoster Recombinant(Shingrix) 03/26/2021, 06/02/2021    Screening Tests Health Maintenance  Topic Date Due   HIV Screening  Never done   Diabetic kidney evaluation - Urine ACR  09/28/2023   INFLUENZA VACCINE  01/26/2024   Diabetic kidney evaluation - eGFR measurement  03/29/2024   Medicare Annual Wellness (AWV)  01/15/2025   DTaP/Tdap/Td (3 - Td or Tdap) 01/11/2033   Colonoscopy  05/08/2033   Pneumococcal Vaccine 8-7 Years old  Completed   COVID-19 Vaccine  Completed   Hepatitis C Screening  Completed   Zoster Vaccines- Shingrix  Completed   Hepatitis B Vaccines  Aged Out   HPV VACCINES  Aged Out   Meningococcal B Vaccine  Aged Out    Health Maintenance  Health Maintenance Due  Topic Date Due   HIV Screening  Never done   Diabetic kidney evaluation - Urine ACR  09/28/2023   Health Maintenance Items Addressed: See Nurse Notes at the end of this note  Additional Screening:  Vision Screening: Recommended annual ophthalmology exams for early detection of glaucoma and other disorders of the eye. Would you like a referral to an eye doctor? No    Dental Screening: Recommended annual dental exams for proper oral hygiene  Community Resource Referral / Chronic Care Management: CRR required this visit?  No   CCM required this visit?  No   Plan:    I have personally reviewed and noted the following in the patient's chart:   Medical and social history Use of alcohol, tobacco or illicit drugs  Current medications and supplements including  opioid prescriptions. Patient is not currently taking opioid prescriptions. Functional ability and status Nutritional status Physical activity Advanced directives List of other physicians Hospitalizations, surgeries, and ER visits in previous 12 months Vitals Screenings to include cognitive, depression, and falls Referrals and appointments  In addition, I have reviewed and discussed with patient certain preventive protocols, quality metrics, and best practice recommendations. A written personalized care plan for preventive services as well as general preventive health recommendations were provided to patient.   Ozie Ned, CMA   01/16/2024   After Visit Summary: (MyChart) Due to this being a telephonic visit, the after visit summary with patients personalized plan was offered to patient via MyChart   Notes: Nothing significant to report at this time.

## 2024-01-16 NOTE — Patient Instructions (Signed)
 Mr. Cesar Leon , Thank you for taking time out of your busy schedule to complete your Annual Wellness Visit with me. I enjoyed our conversation and look forward to speaking with you again next year. I, as well as your care team,  appreciate your ongoing commitment to your health goals. Please review the following plan we discussed and let me know if I can assist you in the future. Your Game plan/ To Do List    Follow up Visits: Next Medicare AWV with our clinical staff: 01/16/25 at 8:40a.m.   Next Office Visit with your provider: 03/27/24 at 8:10a.m.  Clinician Recommendations:  Aim for 30 minutes of exercise or brisk walking, 6-8 glasses of water, and 5 servings of fruits and vegetables each day.       This is a list of the screening recommended for you and due dates:  Health Maintenance  Topic Date Due   HIV Screening  Never done   Yearly kidney health urinalysis for diabetes  09/28/2023   Medicare Annual Wellness Visit  01/12/2024   Flu Shot  01/26/2024   Yearly kidney function blood test for diabetes  03/29/2024   DTaP/Tdap/Td vaccine (3 - Td or Tdap) 01/11/2033   Colon Cancer Screening  05/08/2033   Pneumococcal Vaccination  Completed   COVID-19 Vaccine  Completed   Hepatitis C Screening  Completed   Zoster (Shingles) Vaccine  Completed   Hepatitis B Vaccine  Aged Out   HPV Vaccine  Aged Out   Meningitis B Vaccine  Aged Out    Advanced directives: (In Chart) A copy of your advanced directives are scanned into your chart should your provider ever need it. Advance Care Planning is important because it:  [x]  Makes sure you receive the medical care that is consistent with your values, goals, and preferences  [x]  It provides guidance to your family and loved ones and reduces their decisional burden about whether or not they are making the right decisions based on your wishes.  Follow the link provided in your after visit summary or read over the paperwork we have mailed to you to help  you started getting your Advance Directives in place. If you need assistance in completing these, please reach out to us  so that we can help you!  See attachments for Preventive Care and Fall Prevention Tips.

## 2024-01-24 ENCOUNTER — Ambulatory Visit (INDEPENDENT_AMBULATORY_CARE_PROVIDER_SITE_OTHER)

## 2024-01-24 DIAGNOSIS — E291 Testicular hypofunction: Secondary | ICD-10-CM

## 2024-01-24 NOTE — Progress Notes (Signed)
 Patient is in office today for a nurse visit for Testosterone Injection. Patient Injection was given in the  Left upper quad. gluteus. Patient tolerated injection well.

## 2024-02-07 ENCOUNTER — Ambulatory Visit: Admitting: *Deleted

## 2024-02-07 DIAGNOSIS — E291 Testicular hypofunction: Secondary | ICD-10-CM

## 2024-02-07 NOTE — Progress Notes (Signed)
 Patient is in office today for a nurse visit for Testosterone Injection. Patient Injection was given in the  Right upper quad. gluteus. Patient tolerated injection well.

## 2024-02-10 ENCOUNTER — Other Ambulatory Visit: Payer: Self-pay | Admitting: Family Medicine

## 2024-02-21 ENCOUNTER — Ambulatory Visit (INDEPENDENT_AMBULATORY_CARE_PROVIDER_SITE_OTHER)

## 2024-02-21 DIAGNOSIS — E291 Testicular hypofunction: Secondary | ICD-10-CM | POA: Diagnosis not present

## 2024-02-21 NOTE — Progress Notes (Signed)
 Patient is in office today for a nurse visit for Testosterone  Injection. Patient Injection was given in the  Left upper quad. gluteus. Patient tolerated injection well.

## 2024-02-29 ENCOUNTER — Other Ambulatory Visit: Payer: Self-pay | Admitting: Family Medicine

## 2024-03-06 ENCOUNTER — Ambulatory Visit (INDEPENDENT_AMBULATORY_CARE_PROVIDER_SITE_OTHER): Admitting: *Deleted

## 2024-03-06 DIAGNOSIS — E291 Testicular hypofunction: Secondary | ICD-10-CM

## 2024-03-06 NOTE — Progress Notes (Signed)
 Patient is in office today for a nurse visit for Testosterone  Injection. Patient Injection was given in the  Left upper quad. gluteus. Patient tolerated injection well.

## 2024-03-20 ENCOUNTER — Ambulatory Visit (INDEPENDENT_AMBULATORY_CARE_PROVIDER_SITE_OTHER)

## 2024-03-20 DIAGNOSIS — E291 Testicular hypofunction: Secondary | ICD-10-CM

## 2024-03-20 NOTE — Progress Notes (Signed)
 Patient is in office today for a nurse visit for Testosterone Injection. Patient Injection was given in the  Right upper quad. gluteus. Patient tolerated injection well.

## 2024-03-27 ENCOUNTER — Other Ambulatory Visit: Payer: Self-pay | Admitting: Family Medicine

## 2024-03-27 ENCOUNTER — Ambulatory Visit: Admitting: Family Medicine

## 2024-03-27 ENCOUNTER — Encounter: Payer: Self-pay | Admitting: Family Medicine

## 2024-03-27 VITALS — BP 99/66 | HR 85 | Temp 98.0°F | Ht 68.0 in | Wt 181.0 lb

## 2024-03-27 DIAGNOSIS — M48061 Spinal stenosis, lumbar region without neurogenic claudication: Secondary | ICD-10-CM

## 2024-03-27 DIAGNOSIS — Z0001 Encounter for general adult medical examination with abnormal findings: Secondary | ICD-10-CM

## 2024-03-27 DIAGNOSIS — Z0000 Encounter for general adult medical examination without abnormal findings: Secondary | ICD-10-CM

## 2024-03-27 DIAGNOSIS — E782 Mixed hyperlipidemia: Secondary | ICD-10-CM

## 2024-03-27 DIAGNOSIS — E559 Vitamin D deficiency, unspecified: Secondary | ICD-10-CM | POA: Diagnosis not present

## 2024-03-27 DIAGNOSIS — K219 Gastro-esophageal reflux disease without esophagitis: Secondary | ICD-10-CM

## 2024-03-27 DIAGNOSIS — M1712 Unilateral primary osteoarthritis, left knee: Secondary | ICD-10-CM | POA: Diagnosis not present

## 2024-03-27 DIAGNOSIS — E291 Testicular hypofunction: Secondary | ICD-10-CM

## 2024-03-27 DIAGNOSIS — Z23 Encounter for immunization: Secondary | ICD-10-CM

## 2024-03-27 LAB — URINALYSIS
Bilirubin, UA: NEGATIVE
Glucose, UA: NEGATIVE
Ketones, UA: NEGATIVE
Leukocytes,UA: NEGATIVE
Nitrite, UA: NEGATIVE
Protein,UA: NEGATIVE
Specific Gravity, UA: 1.01 (ref 1.005–1.030)
Urobilinogen, Ur: 0.2 mg/dL (ref 0.2–1.0)
pH, UA: 7 (ref 5.0–7.5)

## 2024-03-27 LAB — LIPID PANEL

## 2024-03-27 MED ORDER — MELOXICAM 15 MG PO TABS: 15.0000 mg | ORAL_TABLET | Freq: Every day | ORAL | 3 refills | Status: AC

## 2024-03-27 MED ORDER — ATORVASTATIN CALCIUM 40 MG PO TABS: 40.0000 mg | ORAL_TABLET | Freq: Every day | ORAL | 3 refills | Status: AC

## 2024-03-27 MED ORDER — TESTOSTERONE CYPIONATE 200 MG/ML IM SOLN: INTRAMUSCULAR | 1 refills | Status: AC

## 2024-03-27 NOTE — Progress Notes (Signed)
 Subjective:  Patient ID: Cesar Leon, male    DOB: [birth year]  Age: 63 y.o. MRN: [id number]  CC: Annual Exam   HPI  Discussed the use of AI scribe software for clinical note transcription with the patient, who gave verbal consent to proceed.  History of Present Illness Cesar Leon is a 63 year old male who presents for a routine physical exam.  He has no new health concerns. He recently obtained glasses for driving but has no other vision changes. He reports that he was told he does not have cataracts, glaucoma, or macular degeneration when his eyes were checked.  No chest pain, shortness of breath, coughing, heartburn, or indigestion. Urinary habits are normal with no nocturia, urinating four to five times in the morning, which then eases up.  He experiences occasional left knee pain, particularly with weather changes, but it is not severe. An orthopedist advised a knee replacement, but he postponed it due to recent back surgery. He takes meloxicam  for arthritis in his back and knee, using it every other day or daily as needed.  He is on testosterone  therapy, receiving 150 mg every two weeks, and notes a weight gain of five pounds since starting testosterone , attributing it to increased muscle mass rather than dietary changes.  He takes omeprazole  for stomach issues and reports no heartburn or reflux symptoms. He also uses Miralax  nightly for constipation, which he started after a colonoscopy that showed no issues.  He maintains a healthy lifestyle, drinking water throughout the day and limiting alcohol to one drink at night.          03/27/2024    8:08 AM 01/16/2024    8:48 AM 03/30/2023    8:30 AM  Depression screen PHQ 2/9  Decreased Interest 0 0 0  Down, Depressed, Hopeless 0 0 0  PHQ - 2 Score 0 0 0  Altered sleeping  0   Tired, decreased energy  0   Change in appetite  0   Feeling bad or failure about yourself   0   Trouble concentrating  0   Moving slowly  or fidgety/restless  0   Suicidal thoughts  0   PHQ-9 Score  0   Difficult doing work/chores  Not difficult at all     History Camdan has a past medical history of Allergic rhinitis, Allergy, Arthritis, Clotting disorder, DDD (degenerative disc disease) (12/28/2008), GERD (gastroesophageal reflux disease), Hematuria, History of GI bleed, Hyperlipidemia, Hypogonadism male, Hypothyroidism, Internal hemorrhoid, Opioid type dependence, continuous (HCC) (09/28/2015), PVD (peripheral vascular disease), Sleep apnea, and Ulcer.   He has a past surgical history that includes Carpal tunnel release (01/25/2010); right ankle  (06/27/1974); Colon surgery; and Spine surgery.   His family history includes Anxiety disorder in his father; Arthritis in his father and mother; Cancer in his father and mother; Depression in his father; Diabetes in his brother and father; Lung cancer in his father; Peripheral vascular disease in his father.He reports that he quit smoking about 29 years ago. His smoking use included cigarettes. He started smoking about 44 years ago. He has a 30 pack-year smoking history. He has never used smokeless tobacco. He reports that he does not currently use alcohol. He reports that he does not use drugs.    ROS Review of Systems  Constitutional:  Negative for activity change, fatigue and unexpected weight change.  HENT:  Negative for congestion, ear pain, hearing loss, postnasal drip and trouble swallowing.   Eyes:  Negative  for pain and visual disturbance.  Respiratory:  Negative for cough, chest tightness and shortness of breath.   Cardiovascular:  Negative for chest pain, palpitations and leg swelling.  Gastrointestinal:  Negative for abdominal distention, abdominal pain, blood in stool, constipation, diarrhea, nausea and vomiting.  Endocrine: Negative for cold intolerance, heat intolerance and polydipsia.  Genitourinary:  Negative for difficulty urinating, dysuria, flank pain,  frequency and urgency.  Musculoskeletal:  Positive for arthralgias (left knee). Negative for joint swelling.  Skin:  Negative for color change, rash and wound.  Neurological:  Negative for dizziness, syncope, speech difficulty, weakness, light-headedness, numbness and headaches.  Hematological:  Does not bruise/bleed easily.  Psychiatric/Behavioral:  Negative for confusion, decreased concentration, dysphoric mood and sleep disturbance. The patient is not nervous/anxious.     Objective:  BP 99/66   Pulse 85   Temp 98 F (36.7 C)   Ht 5' 8 (1.727 m)   Wt 181 lb (82.1 kg)   SpO2 99%   BMI 27.52 kg/m   BP Readings from Last 3 Encounters:  03/27/24 99/66  01/16/24 123/72  09/28/23 123/72    Wt Readings from Last 3 Encounters:  03/27/24 181 lb (82.1 kg)  01/16/24 176 lb (79.8 kg)  09/28/23 176 lb (79.8 kg)     Physical Exam Physical Exam VITALS: BP- 99/66 MEASUREMENTS: Weight- 181. GENERAL: Alert, cooperative, well developed, no acute distress. HEENT: Normocephalic, normal oropharynx, moist mucous membranes, extraocular movements intact. NECK: No thyromegaly. CHEST: Clear to auscultation bilaterally, no wheezes, rhonchi, or crackles. CARDIOVASCULAR: Normal heart rate and rhythm, S1 and S2 normal without murmurs, no carotid bruit. ABDOMEN: Soft, non-tender, non-distended, without organomegaly, normal bowel sounds, liver not enlarged, gallbladder non-tender, no splenomegaly. GENITOURINARY: No inguinal hernia. EXTREMITIES: No cyanosis or edema, varicose veins around ankles. MUSCULOSKELETAL: Normal. NEUROLOGICAL: Cranial nerves grossly intact, moves all extremities without gross motor or sensory deficit. SKIN: No signs of skin cancer.   Assessment & Plan:  Well adult exam -     CBC with Differential/Platelet -     CMP14+EGFR -     Urinalysis  Encounter for immunization -     Flu vaccine trivalent PF, 6mos and older(Flulaval,Afluria,Fluarix,Fluzone) -     CBC with  Differential/Platelet -     CMP14+EGFR  Testosterone  deficiency in male -     CBC with Differential/Platelet -     CMP14+EGFR -     PSA, total and free -     Testosterone ,Free and Total  Mixed hyperlipidemia -     CBC with Differential/Platelet -     CMP14+EGFR -     Lipid panel  Hypogonadism male -     CBC with Differential/Platelet -     CMP14+EGFR -     PSA, total and free -     Testosterone ,Free and Total  Primary osteoarthritis of left knee -     CBC with Differential/Platelet -     CMP14+EGFR -     Urinalysis  Spinal stenosis of lumbar region, unspecified whether neurogenic claudication present -     CBC with Differential/Platelet -     CMP14+EGFR  Vitamin D  deficiency -     VITAMIN D  25 Hydroxy (Vit-D Deficiency, Fractures)  Other orders -     Meloxicam ; Take 1 tablet (15 mg total) by mouth daily.  Dispense: 90 tablet; Refill: 3 -     Testosterone  Cypionate; INJECT 0.75ML INTO THE MUSCLE EVERY 14 DAYS  Dispense: 5 mL; Refill: 1 -     Atorvastatin   Calcium ; Take 1 tablet (40 mg total) by mouth daily.  Dispense: 90 tablet; Refill: 3    Assessment and Plan Assessment & Plan Osteoarthritis of left knee and lumbar spine   Chronic osteoarthritis affects his left knee and lumbar spine, with mild knee pain influenced by weather changes. A knee replacement was recommended but postponed due to recent back surgery. He uses meloxicam  for pain management. Continue meloxicam , monitor kidney function with blood tests twice a year, and consider knee replacement surgery when feasible.  Testosterone  deficiency   Testosterone  deficiency is managed with 150 mg testosterone  injections biweekly. The current dose is effective, but there are pharmacy issues with mail order. Send the testosterone  prescription to [facility] and ensure testosterone  levels are checked regularly.  Gastroesophageal reflux disease (GERD)   GERD is well-controlled with omeprazole , with no current symptoms of heartburn  or indigestion. Meloxicam  may exacerbate reflux, but omeprazole  should mitigate this. Continue omeprazole  daily and monitor for any reflux symptoms.  Constipation   Chronic constipation is managed with nightly over-the-counter Miralax , which effectively maintains regular bowel movements. Continue Miralax  nightly.  Pure hypercholesterolemia   Hypercholesterolemia is managed with atorvastatin , with no current issues reported. Refill atorvastatin  prescription.  Asymptomatic varicose veins of lower extremity   Asymptomatic varicose veins are present around his ankles, with no swelling, burning, or stinging reported. Advise leg elevation if symptoms develop.  Adult Wellness Visit   Overall health is stable with no new complaints. Vision is stable with glasses, and there are no signs of cataracts, glaucoma, or macular degeneration. He reports no chest pain, shortness of breath, or indigestion. Urinary function is normal with no nocturia. Blood pressure is on the low end but stable. He attends regular dental visits every six months. A flu shot was administered. Order an annual vision exam, continue regular dental visits every six months, and administer the flu shot.       Follow-up: No follow-ups on file.  Butler Der, M.D.

## 2024-03-29 LAB — CBC WITH DIFFERENTIAL/PLATELET
Basophils Absolute: 0 x10E3/uL (ref 0.0–0.2)
Basos: 1 %
EOS (ABSOLUTE): 0.1 x10E3/uL (ref 0.0–0.4)
Eos: 1 %
Hematocrit: 46.4 % (ref 37.5–51.0)
Hemoglobin: 15 g/dL (ref 13.0–17.7)
Immature Grans (Abs): 0 x10E3/uL (ref 0.0–0.1)
Immature Granulocytes: 0 %
Lymphocytes Absolute: 1 x10E3/uL (ref 0.7–3.1)
Lymphs: 16 %
MCH: 30.7 pg (ref 26.6–33.0)
MCHC: 32.3 g/dL (ref 31.5–35.7)
MCV: 95 fL (ref 79–97)
Monocytes Absolute: 0.6 x10E3/uL (ref 0.1–0.9)
Monocytes: 8 %
Neutrophils Absolute: 5 x10E3/uL (ref 1.4–7.0)
Neutrophils: 74 %
Platelets: 258 x10E3/uL (ref 150–450)
RBC: 4.89 x10E6/uL (ref 4.14–5.80)
RDW: 11.8 % (ref 11.6–15.4)
WBC: 6.7 x10E3/uL (ref 3.4–10.8)

## 2024-03-29 LAB — CMP14+EGFR
ALT: 17 IU/L (ref 0–44)
AST: 20 IU/L (ref 0–40)
Albumin: 4.5 g/dL (ref 3.9–4.9)
Alkaline Phosphatase: 81 IU/L (ref 47–123)
BUN/Creatinine Ratio: 16 (ref 10–24)
BUN: 15 mg/dL (ref 8–27)
Bilirubin Total: 0.6 mg/dL (ref 0.0–1.2)
CO2: 26 mmol/L (ref 20–29)
Calcium: 9.7 mg/dL (ref 8.6–10.2)
Chloride: 100 mmol/L (ref 96–106)
Creatinine, Ser: 0.95 mg/dL (ref 0.76–1.27)
Globulin, Total: 2.3 g/dL (ref 1.5–4.5)
Glucose: 123 mg/dL — AB (ref 70–99)
Potassium: 4.3 mmol/L (ref 3.5–5.2)
Sodium: 140 mmol/L (ref 134–144)
Total Protein: 6.8 g/dL (ref 6.0–8.5)
eGFR: 89 mL/min/1.73 (ref >59)

## 2024-03-29 LAB — LIPID PANEL
Cholesterol, Total: 120 mg/dL (ref 100–199)
HDL: 51 mg/dL (ref >39)
LDL CALC COMMENT:: 2.4 ratio (ref 0.0–5.0)
LDL Chol Calc (NIH): 58 mg/dL (ref 0–99)
Triglycerides: 44 mg/dL (ref 0–149)
VLDL Cholesterol Cal: 11 mg/dL (ref 5–40)

## 2024-03-29 LAB — PSA, TOTAL AND FREE
PSA, Free Pct: 23.3 %
PSA, Free: 0.14 ng/mL
Prostate Specific Ag, Serum: 0.6 ng/mL (ref 0.0–4.0)

## 2024-03-29 LAB — TESTOSTERONE,FREE AND TOTAL
Testosterone, Free: 14.1 pg/mL (ref 6.6–18.1)
Testosterone: 909 ng/dL (ref 264–916)

## 2024-03-29 LAB — VITAMIN D 25 HYDROXY (VIT D DEFICIENCY, FRACTURES): Vit D, 25-Hydroxy: 63 ng/mL (ref 30.0–100.0)

## 2024-03-31 ENCOUNTER — Ambulatory Visit: Payer: Self-pay | Admitting: Family Medicine

## 2024-03-31 NOTE — Progress Notes (Signed)
Hello Cesar Leon,  Your lab result is normal and/or stable.Some minor variations that are not significant are commonly marked abnormal, but do not represent any medical problem for you.  Best regards, Virna Livengood, M.D.

## 2024-04-03 ENCOUNTER — Encounter: Admitting: Family Medicine

## 2024-04-03 ENCOUNTER — Ambulatory Visit (INDEPENDENT_AMBULATORY_CARE_PROVIDER_SITE_OTHER): Admitting: *Deleted

## 2024-04-03 DIAGNOSIS — E291 Testicular hypofunction: Secondary | ICD-10-CM | POA: Diagnosis not present

## 2024-04-03 MED ORDER — TESTOSTERONE CYPIONATE 200 MG/ML IM SOLN
150.0000 mg | INTRAMUSCULAR | Status: AC
  Administered 2024-04-03 – 2024-07-26 (×9): 150 mg via INTRAMUSCULAR

## 2024-04-03 NOTE — Progress Notes (Signed)
 Patient is in office today for a nurse visit for Testosterone  Injection. Patient Injection was given in the  Left upper quad. gluteus. Patient tolerated injection well.

## 2024-04-17 ENCOUNTER — Ambulatory Visit: Admitting: *Deleted

## 2024-04-17 DIAGNOSIS — E291 Testicular hypofunction: Secondary | ICD-10-CM

## 2024-04-17 NOTE — Progress Notes (Signed)
 Patient is in office today for a nurse visit for Testosterone Injection. Patient Injection was given in the  Right upper quad. gluteus. Patient tolerated injection well.

## 2024-05-01 ENCOUNTER — Ambulatory Visit: Payer: Self-pay

## 2024-05-01 DIAGNOSIS — E349 Endocrine disorder, unspecified: Secondary | ICD-10-CM

## 2024-05-01 DIAGNOSIS — E291 Testicular hypofunction: Secondary | ICD-10-CM | POA: Diagnosis not present

## 2024-05-01 NOTE — Progress Notes (Signed)
 Patient is in office today for a nurse visit for Testosterone  Injection. Patient Injection was given in the  Left upper quad. gluteus. Patient tolerated injection well.

## 2024-05-06 NOTE — Progress Notes (Signed)
 Cesar Leon                                          MRN: [id number]   05/06/2024   The [facility] Quality Team Specialist reviewed this patient medical record for the purposes of chart review for care gap closure. The following were reviewed: chart review for care gap closure-glycemic status assessment.    [facility] Quality Team

## 2024-05-15 ENCOUNTER — Ambulatory Visit: Payer: Self-pay | Admitting: *Deleted

## 2024-05-15 DIAGNOSIS — E349 Endocrine disorder, unspecified: Secondary | ICD-10-CM

## 2024-05-15 DIAGNOSIS — E291 Testicular hypofunction: Secondary | ICD-10-CM

## 2024-05-15 NOTE — Progress Notes (Signed)
 Patient is in office today for a nurse visit for Testosterone Injection. Patient Injection was given in the  Right upper quad. gluteus. Patient tolerated injection well.

## 2024-05-29 ENCOUNTER — Ambulatory Visit: Payer: Self-pay

## 2024-05-29 DIAGNOSIS — E291 Testicular hypofunction: Secondary | ICD-10-CM

## 2024-05-29 DIAGNOSIS — E349 Endocrine disorder, unspecified: Secondary | ICD-10-CM

## 2024-05-29 NOTE — Progress Notes (Signed)
 Patient is in office today for a nurse visit for Testosterone  Injection. Patient Injection was given in the  Left upper quad. gluteus. Patient tolerated injection well.

## 2024-06-06 LAB — HEMOGLOBIN A1C

## 2024-06-12 ENCOUNTER — Ambulatory Visit: Payer: Self-pay

## 2024-06-12 DIAGNOSIS — E291 Testicular hypofunction: Secondary | ICD-10-CM

## 2024-06-12 DIAGNOSIS — E349 Endocrine disorder, unspecified: Secondary | ICD-10-CM

## 2024-06-12 NOTE — Progress Notes (Signed)
 Patient is in office today for a nurse visit for Testosterone Injection. Patient Injection was given in the  Right upper quad. gluteus. Patient tolerated injection well.

## 2024-06-28 ENCOUNTER — Ambulatory Visit (INDEPENDENT_AMBULATORY_CARE_PROVIDER_SITE_OTHER): Admitting: *Deleted

## 2024-06-28 DIAGNOSIS — E291 Testicular hypofunction: Secondary | ICD-10-CM

## 2024-06-28 DIAGNOSIS — E349 Endocrine disorder, unspecified: Secondary | ICD-10-CM

## 2024-06-28 NOTE — Progress Notes (Addendum)
 Patient is in office today for a nurse visit for Testosterone  Injection. Patient Injection was given in the  Left upper quad. gluteus. Patient tolerated injection well.

## 2024-07-12 ENCOUNTER — Ambulatory Visit: Admitting: *Deleted

## 2024-07-12 ENCOUNTER — Ambulatory Visit

## 2024-07-12 DIAGNOSIS — E291 Testicular hypofunction: Secondary | ICD-10-CM

## 2024-07-12 DIAGNOSIS — E349 Endocrine disorder, unspecified: Secondary | ICD-10-CM

## 2024-07-12 NOTE — Progress Notes (Signed)
 Patient is in office today for a nurse visit for Testosterone Injection. Patient Injection was given in the  Right upper quad. gluteus. Patient tolerated injection well.

## 2024-07-24 NOTE — Progress Notes (Signed)
 ANAV LAMMERT                                          MRN: [id number]   07/24/2024   The [facility] Quality Team Specialist reviewed this patient medical record for the purposes of chart review for care gap closure. The following were reviewed: abstraction for care gap closure-glycemic status assessment.    [facility] Quality Team

## 2024-07-26 ENCOUNTER — Ambulatory Visit: Admitting: *Deleted

## 2024-07-26 DIAGNOSIS — E291 Testicular hypofunction: Secondary | ICD-10-CM | POA: Diagnosis not present

## 2024-07-26 DIAGNOSIS — E349 Endocrine disorder, unspecified: Secondary | ICD-10-CM

## 2024-07-26 NOTE — Progress Notes (Signed)
 Patient is in office today for a nurse visit for Testosterone  Injection. Patient Injection was given in the  Left upper quad. gluteus. Patient tolerated injection well.

## 2024-08-02 NOTE — Progress Notes (Signed)
 Cesar Leon                                          MRN: [id number]   08/02/2024   The [facility] Quality Team Specialist reviewed this patient medical record for the purposes of chart review for care gap closure. The following were reviewed: chart review for care gap closure-kidney health evaluation for diabetes:eGFR  and uACR.    [facility] Quality Team

## 2024-08-09 ENCOUNTER — Ambulatory Visit

## 2024-08-23 ENCOUNTER — Ambulatory Visit

## 2024-09-26 ENCOUNTER — Ambulatory Visit: Payer: Self-pay | Admitting: Family Medicine

## 2025-01-16 ENCOUNTER — Ambulatory Visit: Payer: Self-pay
